# Patient Record
Sex: Female | Born: 1950 | Race: White | Hispanic: No | Marital: Single | Smoking: Former smoker
Health system: Southern US, Community
[De-identification: ages and names within clinical notes are randomized; demographics above are authoritative.]

## PROBLEM LIST (undated history)

## (undated) DIAGNOSIS — M1990 Unspecified osteoarthritis, unspecified site: Secondary | ICD-10-CM

## (undated) DIAGNOSIS — R519 Headache, unspecified: Secondary | ICD-10-CM

## (undated) DIAGNOSIS — T4145XA Adverse effect of unspecified anesthetic, initial encounter: Secondary | ICD-10-CM

## (undated) DIAGNOSIS — G479 Sleep disorder, unspecified: Secondary | ICD-10-CM

## (undated) DIAGNOSIS — T8859XA Other complications of anesthesia, initial encounter: Secondary | ICD-10-CM

## (undated) DIAGNOSIS — Z85828 Personal history of other malignant neoplasm of skin: Secondary | ICD-10-CM

## (undated) DIAGNOSIS — C4490 Unspecified malignant neoplasm of skin, unspecified: Secondary | ICD-10-CM

## (undated) DIAGNOSIS — E785 Hyperlipidemia, unspecified: Secondary | ICD-10-CM

## (undated) DIAGNOSIS — R112 Nausea with vomiting, unspecified: Secondary | ICD-10-CM

## (undated) DIAGNOSIS — I10 Essential (primary) hypertension: Secondary | ICD-10-CM

## (undated) DIAGNOSIS — Z9889 Other specified postprocedural states: Secondary | ICD-10-CM

## (undated) DIAGNOSIS — R51 Headache: Secondary | ICD-10-CM

## (undated) HISTORY — PX: COLONOSCOPY: SHX174

## (undated) HISTORY — DX: Hyperlipidemia, unspecified: E78.5

## (undated) HISTORY — PX: APPENDECTOMY: SHX54

## (undated) HISTORY — DX: Unspecified malignant neoplasm of skin, unspecified: C44.90

## (undated) HISTORY — PX: TONSILLECTOMY AND ADENOIDECTOMY: SUR1326

## (undated) HISTORY — PX: SKIN CANCER EXCISION: SHX779

---

## 1979-12-14 HISTORY — PX: ABDOMINAL HYSTERECTOMY: SHX81

## 1999-08-07 ENCOUNTER — Other Ambulatory Visit: Admission: RE | Admit: 1999-08-07 | Discharge: 1999-08-07 | Payer: Self-pay | Admitting: *Deleted

## 2000-09-06 ENCOUNTER — Other Ambulatory Visit: Admission: RE | Admit: 2000-09-06 | Discharge: 2000-09-06 | Payer: Self-pay | Admitting: *Deleted

## 2001-09-07 ENCOUNTER — Other Ambulatory Visit: Admission: RE | Admit: 2001-09-07 | Discharge: 2001-09-07 | Payer: Self-pay | Admitting: *Deleted

## 2002-09-10 ENCOUNTER — Other Ambulatory Visit: Admission: RE | Admit: 2002-09-10 | Discharge: 2002-09-10 | Payer: Self-pay | Admitting: Obstetrics and Gynecology

## 2003-09-13 ENCOUNTER — Other Ambulatory Visit: Admission: RE | Admit: 2003-09-13 | Discharge: 2003-09-13 | Payer: Self-pay | Admitting: Obstetrics and Gynecology

## 2011-03-03 ENCOUNTER — Ambulatory Visit: Payer: Self-pay | Admitting: Internal Medicine

## 2011-04-12 ENCOUNTER — Other Ambulatory Visit (HOSPITAL_COMMUNITY): Payer: Self-pay

## 2011-06-25 NOTE — H&P (Unsigned)
[name], [name]  MEDICAL RECORD NO.:  [id number]  LOCATION:                                 FACILITY:  PHYSICIAN:  Carol Frankel. Charlann King, M.D.  DATE OF BIRTH:  [birth year]  DATE OF ADMISSION: DATE OF DISCHARGE:                             HISTORY & PHYSICAL   ADMISSION DIAGNOSIS:  Right hip osteoarthritis.  HISTORY OF PRESENT ILLNESS:  This is a 60 year old lady with a history of osteoarthritis of the right hip that has failed conservative treatment.  After discussion of treatment, benefits, risks, and options, the patient is now scheduled for anterior total hip arthroplasty of the right hip.  Note that the patient will be going to rehab postoperatively, so prescriptions were not given today.  She is not a candidate for tranexamic acid and will not receive it in preop holding.  DRUG ALLERGIES:  VICODIN and DARVON.  CURRENT MEDICATIONS: 1. Indomethacin 2 capsules twice daily 25 mg capsules. 2. Sumatriptan 50-100 mg one p.r.n. headaches.  PAST SURGICAL HISTORY: 1. Hysterectomy. 2. Tonsillectomy. 3. Appendectomy.  SERIOUS MEDICAL ILLNESSES:  Migraines.  SOCIAL HISTORY:  The patient is single.  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  She does not smoke and does not drink.  FAMILY HISTORY:  Positive for breast cancer.  REVIEW OF SYSTEMS:  CENTRAL NERVOUS SYSTEM:  Positive for migraines. PULMONARY:  Negative for shortness of breath, PND, orthopnea. CARDIOVASCULAR:  No chest pain or palpitation.  GASTROINTESTINAL: Negative for ulcers, hepatitis.  GENITOURINARY:  Negative for urinary tract difficulty.  SKIN:  Positive for history of basal cell skin cancer.  MUSCULOSKELETAL:  Positive as in HPI.  PHYSICAL EXAMINATION:  VITAL SIGNS:  BP 160/98, respirations 16, pulse 74 and regular. GENERAL APPEARANCE:  A well-developed, well-nourished lady, in no acute distress. HEENT:  Head normocephalic.  Nose patent.  Pupils equal, round, reactive to  light.  Throat without injection. NECK:  Supple without adenopathy.  Carotids 2+ without bruit. CHEST:  Clear to auscultation.  No rales or rhonchi.  Respirations 16. HEART:  Regular rate and rhythm at 74 beats per minute without murmur. ABDOMEN:  Soft with active bowel sounds.  No masses or organomegaly. NEUROLOGIC:  The patient is alert and oriented to time, place, and person.  Cranial nerves II through XII grossly intact. EXTREMITIES:  Right hip with decreased range of motion with pain. Neurovascular status intact.  IMPRESSION:  Right hip osteoarthritis.  PLAN:  Right total hip arthroplasty, anterior approach.  The patient will be going to a nursing home postoperatively.     Carol King, P.A.   ______________________________ Carol King, M.D.    SJC/MEDQ  D:  03/31/2011  T:  04/01/2011  Job:  [id number]

## 2011-07-06 ENCOUNTER — Other Ambulatory Visit: Payer: Self-pay | Admitting: Orthopedic Surgery

## 2011-07-06 ENCOUNTER — Encounter (HOSPITAL_COMMUNITY): Payer: BC Managed Care – PPO

## 2011-07-06 LAB — BASIC METABOLIC PANEL
BUN: 13 mg/dL (ref 6–23)
Creatinine, Ser: 0.68 mg/dL (ref 0.50–1.10)
GFR calc Af Amer: >60 mL/min (ref >60)
GFR calc non Af Amer: >60 mL/min (ref >60)
Glucose, Bld: 98 mg/dL (ref 70–99)
Potassium: 4 mEq/L (ref 3.5–5.1)

## 2011-07-06 LAB — URINALYSIS, ROUTINE W REFLEX MICROSCOPIC
Bilirubin Urine: NEGATIVE
Hgb urine dipstick: NEGATIVE
Ketones, ur: NEGATIVE mg/dL
Nitrite: NEGATIVE
Protein, ur: NEGATIVE mg/dL
Specific Gravity, Urine: 1.014 (ref 1.005–1.030)
Urobilinogen, UA: 0.2 mg/dL (ref 0.0–1.0)

## 2011-07-06 LAB — CBC
HCT: 38.2 % (ref 36.0–46.0)
MCHC: 33.2 g/dL (ref 30.0–36.0)
Platelets: 315 10*3/uL (ref 150–400)
RDW: 13 % (ref 11.5–15.5)
WBC: 7 10*3/uL (ref 4.0–10.5)

## 2011-07-06 LAB — DIFFERENTIAL
Basophils Absolute: 0.1 10*3/uL (ref 0.0–0.1)
Basophils Relative: 1 % (ref 0–1)
Eosinophils Absolute: 0.1 10*3/uL (ref 0.0–0.7)
Eosinophils Relative: 1 % (ref 0–5)
Lymphocytes Relative: 33 % (ref 12–46)
Monocytes Absolute: 0.5 10*3/uL (ref 0.1–1.0)

## 2011-07-06 LAB — PROTIME-INR
INR: 0.98 (ref 0.00–1.49)
Prothrombin Time: 13.2 seconds (ref 11.6–15.2)

## 2011-07-06 LAB — APTT: aPTT: 34 seconds (ref 24–37)

## 2011-07-11 HISTORY — PX: TOTAL HIP ARTHROPLASTY: SHX124

## 2011-07-12 NOTE — H&P (Unsigned)
NAME:  Carol King, Carol King                  ACCOUNT NO.:  MEDICAL RECORD NO.:  [id number]  LOCATION:                                 FACILITY:  PHYSICIAN:  Madlyn Frankel. Charlann Boxer, M.D.  DATE OF BIRTH:  December 19, 1950  DATE OF ADMISSION: DATE OF DISCHARGE:                             HISTORY & PHYSICAL   ADMISSION DIAGNOSIS:  Right hip osteoarthritis.  HISTORY OF PRESENT ILLNESS:  This is a 60 year old lady with a history of osteoarthritis of her right hip with failure of conservative treatment to manage her pain.  Her surgery was previously scheduled, but had to be cancelled secondary to hypertension and thyroid issues.  She has now had those taken care of and has been cleared by Surgery by Dr. Lucianne Muss, her endocrinologist and surgery to go ahead as scheduled.  The surgery risk, benefits, and aftercare were discussed in detail with the patient.  Questions invited and answered.  Note, she is not a candidate for tranexamic acid and will not receive that at surgery.  She also plans to go to [facility] postoperatively, so her postoperative medications were not given to her today [facility]  PAST MEDICAL HISTORY:  Drug allergies to DARVON and VICODIN which both make her very nervous and edgy.  CURRENT MEDICATIONS: 1. Metoprolol 25 mg 1 b.i.d. 2. Methimazole 5 mg 1/2 tablet daily. 3. Indomethacin 50 mg 1 t.i.d. p.r.n. 4. Tramadol 50 mg 1 b.i.d. p.r.n. pain. 5. Sumatriptan 100-mg tablets 1 p.r.n. for migraines.  MEDICAL ILLNESSES:  Include hypertension and hypothyroidism.  PREVIOUS SURGERIES:  Include skin cancer surgery, hysterectomy, endometriosis surgery, tonsillectomy, and appendectomy.  FAMILY HISTORY:  Positive for aneurysm, breast, and lung cancer.  SOCIAL HISTORY:  The patient is single.  She is an Scientist, water quality.  She does not smoke and does not drink and she plans to go to [facility] after her surgery.  PHYSICAL EXAMINATION:  VITAL SIGNS:  BP 170/90, respirations 16,  pulse 66 and regular. GENERAL APPEARANCE:  This is a well-developed, well-nourished lady, in no acute distress. HEENT:  Head normocephalic.  Nose patent.  Ears patent.  Pupils equal, round, and reactive to light.  Throat without injection. NECK:  Supple without adenopathy.  Carotids are 2+ without bruit. CHEST:  Clear to auscultation.  No rales or rhonchi.  Respirations 16. HEART:  Regular rate and rhythm at 66 beats per minute without murmur. ABDOMEN:  Soft.  Active bowel sounds.  No masses or organomegaly. NEUROLOGIC:  The patient is alert and oriented to time, place, and person.  Cranial nerves II-XII grossly intact. EXTREMITIES:  Shows the right hip with decreased range of motion with pain.  Neurovascular status intact.  ADMITTING DIAGNOSIS:  Right hip osteoarthritis.  PLAN:  Right hip total hip arthroplasty by anterior approach.     Jaquelyn Bitter. Sister Carbone, P.A.   ______________________________ Madlyn Frankel Charlann Boxer, M.D.    SJC/MEDQ  D:  06/30/2011  T:  07/01/2011  Job:  [id number]

## 2011-07-13 ENCOUNTER — Inpatient Hospital Stay (HOSPITAL_COMMUNITY)
Admission: RE | Admit: 2011-07-13 | Discharge: 2011-07-16 | DRG: 818 | Disposition: A | Discharge status: Home Health | Payer: BC Managed Care – PPO | Source: Ambulatory Visit | Attending: Orthopedic Surgery | Admitting: Orthopedic Surgery

## 2011-07-13 ENCOUNTER — Inpatient Hospital Stay (HOSPITAL_COMMUNITY): Payer: BC Managed Care – PPO

## 2011-07-13 DIAGNOSIS — I10 Essential (primary) hypertension: Secondary | ICD-10-CM | POA: Diagnosis present

## 2011-07-13 DIAGNOSIS — IMO0001 Reserved for inherently not codable concepts without codable children: Secondary | ICD-10-CM | POA: Diagnosis present

## 2011-07-13 DIAGNOSIS — M1610 Unilateral primary osteoarthritis, unspecified hip: Principal | ICD-10-CM | POA: Diagnosis present

## 2011-07-13 DIAGNOSIS — E039 Hypothyroidism, unspecified: Secondary | ICD-10-CM | POA: Diagnosis present

## 2011-07-13 DIAGNOSIS — M169 Osteoarthritis of hip, unspecified: Principal | ICD-10-CM | POA: Diagnosis present

## 2011-07-13 DIAGNOSIS — Z01812 Encounter for preprocedural laboratory examination: Secondary | ICD-10-CM

## 2011-07-13 LAB — TYPE AND SCREEN: ABO/RH(D): O POS

## 2011-07-13 LAB — ABO/RH: ABO/RH(D): O POS

## 2011-07-14 LAB — BASIC METABOLIC PANEL
CO2: 29 mEq/L (ref 19–32)
Chloride: 106 mEq/L (ref 96–112)
GFR calc non Af Amer: >60 mL/min (ref >60)
Glucose, Bld: 110 mg/dL — ABNORMAL HIGH (ref 70–99)
Potassium: 4.4 mEq/L (ref 3.5–5.1)
Sodium: 140 mEq/L (ref 135–145)

## 2011-07-14 LAB — CBC
Hemoglobin: 9.5 g/dL — ABNORMAL LOW (ref 12.0–15.0)
RBC: 3.01 MIL/uL — ABNORMAL LOW (ref 3.87–5.11)

## 2011-07-15 LAB — BASIC METABOLIC PANEL
CO2: 30 mEq/L (ref 19–32)
Calcium: 9 mg/dL (ref 8.4–10.5)
Glucose, Bld: 97 mg/dL (ref 70–99)
Sodium: 138 mEq/L (ref 135–145)

## 2011-07-15 LAB — CBC
Hemoglobin: 9.1 g/dL — ABNORMAL LOW (ref 12.0–15.0)
MCH: 30.6 pg (ref 26.0–34.0)
MCV: 91.9 fL (ref 78.0–100.0)
RBC: 2.97 MIL/uL — ABNORMAL LOW (ref 3.87–5.11)

## 2011-07-17 NOTE — Op Note (Signed)
NAMEKEERTHI, HAZELL NO.:  [id number]  MEDICAL RECORD NO.:  [id number]  LOCATION:  1621                         FACILITY:  [facility]  PHYSICIAN:  Madlyn Frankel. Charlann Boxer, M.D.  DATE OF BIRTH:  [birth year]  DATE OF PROCEDURE:  07/13/2011 DATE OF DISCHARGE:                              OPERATIVE REPORT   PREOPERATIVE DIAGNOSIS:  Right hip avascular necrosis.  POSTOPERATIVE DIAGNOSIS:  Right hip avascular necrosis.  PROCEDURE:  Right total hip replacement via an anterior approach.  COMPONENTS USED:  DePuy hip system, size 48 Pinnacle cup, a 32 +4 neutral AltrX liner, a size 3 standard Tri-Lock stem with a 32 +1 Delta ceramic ball.  SURGEON:  Madlyn Frankel. Charlann Boxer, M.D.  ASSISTANT:  Lanney Gins, PA  ANESTHESIA:  General.  BLOOD LOSS:  200 cc.  COMPLICATIONS:  None.  INDICATIONS FOR PROCEDURE:  Ms. Dearman is a 60 year old female who had been seen in the office for right hip pain.  Radiographs had revealed fairly significant degenerative changes.  Sometime before we had seen her and when she presented back her symptoms had gotten worse and she at this point is ready to proceed with more definitive measures. Risks and benefits were discussed including infection, DVT, component failure, dislocation, in addition the pros and cons of an anterior versus posterior approach.  She wished at this point to proceed with an anterior approach.  Consent was obtained for the benefit of pain relief.  PROCEDURE IN DETAIL:  The patient was brought to the operative theater. Once adequate anesthesia, preoperative antibiotics, Ancef administered, the patient was positioned supine on the OSI Hana table.  Bony prominences were padded.  The right arm was placed along her chest again with adequate padding.  The right hip was then preliminarily prepped out or draped down. Fluoroscopy was used to confirm position and landmarks.  The right hip was then prepped and draped in sterile fashion  allowing for access to the anterior thigh.  Time-out was performed identifying the patient, planned procedure, and extremity.  An incision was thus made 2 cm distal and lateral to the anterior- superior iliac spine which was readily palpable on her.  Sharp dissection was carried to the fascia of the tensor fascia lata muscle which was then incised.  The muscle was swept laterally and retractor placed along the superior neck.  A second retractor was placed inferiorly and the circumflex vessels and pericapsular fat were debrided.  The anterior capsule was thus exposed and a capsulotomy made from the acetabular rim towards trochanteric fossa then extended proximally and distally.  Tag sutures were placed and retractor was placed. Intracapsular traction was applied to the hip.  Landmarks of a planned osteotomy were measured and confirmed radiographically.  Oscillating saw was used to make a neck osteotomy.  The femoral head was then removed without difficulty or complication.  Traction was then taken off.  The retractor was placed posteriorly outside the labrum.  Second retractor was placed along the anterior rim. After labral debridement, began reaming with a 42 reamer and reamed up to a 47 reamer with good bony bed preparation, two  reamers were used. Under fluoroscopic imaging, confirmed depth of reaming and orientation.  A 48 Pinnacle cup was chosen and it was impacted with a curved impactor under fluoroscopic imaging to confirm abduction and anteversion.  Once the cup was well seated, there was palpable anterior rim.  Single cancellous screw was placed in the ilium to support initial fixation. Given the position and orientation, the final 32 +4 neutral AltrX liner was impacted into position.  At this point, attention was now directed to the femur.  The lateral hook was placed.  The femur was then rotated to 100 degrees and inferior capsule removed and the retractor placed.  I then  created a window along the superior capsule.  The leg was then extended and adducted and posterior trochanteric fossa tissue debrided to allow for placement of femoral broach.  With retractors in place, a box osteotome was used to create an osteotomy into the femur.  I then by hand passed the starting broach. The next broach is a 0 broach which was impacted and I confirmed its position in the AP and lateral planes under fluoroscopy.  Retractors were placed and ended up broaching up to a size 2 broach which did not have a good fit.  At this point, I did a trial reduction with a standard neck based on her pelvis radiographs preoperatively and a 32 one ball. At this point, I felt that perhaps would broach up one more based on the size.  In addition, the leg lengths looked comfortable, and is very well positioned.  At this point, the trial component was removed.  Retractors were placed. I broached to a size 3 inches, 3 standard stem.  The 3 standard stem was then impacted and sat at the level where the broach was with good medial and lateral metaphyseal fit without evidence of any torsional movement. Based on this and my trial reduction, 32 +1 Delta ceramic ball was then chosen, impacted onto the clean and dry trunnion and the hip was reduced.  We had irrigated the hip throughout the case and again at this point final radiographs were taken of the pelvis to confirm leg lengths as well as position of the stem.  The anterior capsule was then reapproximated using #1 Vicryl.  There was very little bleeding, so no Hemovac drain was used.  The fascia of the tensor fascia lata was then reapproximated using #1 Vicryl in running fashion.  The remainder of the wound was closed with 2-0 Vicryl and running 4-0 Monocryl.  The hip was cleaned, dried, and dressed sterilely using Dermabond and Aquacel dressing.  The patient was then brought to recovery room in stable condition, tolerating the procedure  well.     Madlyn Frankel Charlann Boxer, M.D.     MDO/MEDQ  D:  07/13/2011  T:  07/14/2011  Job:  [id number]  Electronically Signed by Durene Romans M.D. on 07/17/2011 09:20:57 AM

## 2011-07-20 NOTE — Discharge Summary (Signed)
NAMEJERRE, DIGUGLIELMO NO.:  [id number]  MEDICAL RECORD NO.:  [id number]  LOCATION:  1621                         FACILITY:  [facility]  PHYSICIAN:  Madlyn Frankel. Charlann Boxer, M.D.  DATE OF BIRTH:  [birth year]  DATE OF ADMISSION:  07/13/2011 DATE OF DISCHARGE:  07/16/2011                              DISCHARGE SUMMARY   PROCEDURE:  Right total hip arthroplasty, anterior approach.  ATTENDING PHYSICIAN:  Madlyn Frankel. Charlann Boxer, MD  ADMITTING DIAGNOSIS:  Right hip osteoarthritis.  DISCHARGE DIAGNOSES: 1. Status post right total hip arthroplasty, anterior approach. 2. Hypertension. 3. Hypothyroidism.  HISTORY OF PRESENT ILLNESS:  The patient is a 60 year old with a history of osteoarthritis of her right hip.  The patient tried several conservative treatments, which all failed to relieve her symptoms.  X- rays in the clinic revealed osteoarthritis of the right hip.  Various options were discussed with the patient and the patient wishes to proceed with surgery.  Risks, benefits, and expectations of the procedure were discussed with the patient.  The patient understands the risks, benefits, and expectations and wishes to proceed with surgery.  HOSPITAL COURSE:  The patient underwent the above-stated procedure on July 13, 2011.  The patient tolerated the procedure well, was brought to the recovery room in good condition, subsequently to the floor.  Postop day #1, July 14, 2011, the patient doing really well, having minimal pain which is well controlled, afebrile, vital signs stable.  Hemoglobin is 9.5 and hematocrit is 27.5.  The patient distally neurovascularly intact.  Dressing is clean, dry, and intact.  The patient will start PT and OT.  On postop day #2, July 15, 2011, the patient continued to be very well, afebrile, vital signs stable.  Hematocrit is 27.3.  The right hip is clean, dry, and intact.  Distally neurovascularly intact.  The patient changed her plan from SNF to  home.  The patient continued physical therapy, pain medicines were changed to tramadol.  On postop day #3, July 16, 2011, the patient doing really well, minimal discomfort, real-real pain in the area, ambulating with the walker by herself very well.  Afebrile, vital signs stable, distally neurovascularly intact.  Dressing clean, dry, intact.  She feels ready to go home.  She will be discharged to home today after PT.  DISCHARGE CONDITION:  Good.  DISCHARGE INSTRUCTIONS:  The patient will be discharged, weight bearing as tolerated.  She will maintain her surgical dressing for about 8 days, at that time she will take if off and replace it with gauze and dressing.  The patient will keep the area dry, clean until followup. The patient will follow up in 2 weeks with Dr. Charlann Boxer at [facility].  The patient is to call with any questions or concerns.  DISCHARGE MEDICATIONS: 1. Aspirin enteric coated 325 mg 1 p.o. b.i.d. for 6 weeks. 2. Tylenol 1000 mg q.8 h. p.r.n. pain. 3. Benadryl 25 mg 1 p.o. q.4 h. p.r.n. 4. Colace 100 mg 1 p.o. b.i.d. p.r.n. constipation. 5. Iron sulfate 325 mg 1 p.o. t.i.d. for 2 or 3 weeks. 6. MiraLax 17 g 1 p.o. daily p.r.n., constipation. 7. Robaxin 500 mg 1 p.o. q.6 h. p.r.n., muscle  spasms. 8. Tramadol 50 mg 1-2 p.o. q.4-6 h. p.r.n., pain. 9. Amlodipine 5 mg 1 p.o. daily. 10.Methimazole 5 mg 1/2 p.o. q.a.m. 11.Metoprolol 1 p.o. b.i.d. 12.Multivitamin 1 p.o. daily. 13.Sumatriptan 100 mg 1/2 to 1 p.o. b.i.d. as needed for migraines. 14.Vitamin B complex 2 p.o. daily.    ______________________________ Lanney Gins, PA   ______________________________ Madlyn Frankel. Charlann Boxer, M.D.    MB/MEDQ  D:  07/16/2011  T:  07/17/2011  Job:  [id number]  Electronically Signed by Lanney Gins PA on 07/17/2011 05:30:55 PM Electronically Signed by Durene Romans M.D. on 07/20/2011 04:54:09 AM

## 2012-04-10 ENCOUNTER — Ambulatory Visit (INDEPENDENT_AMBULATORY_CARE_PROVIDER_SITE_OTHER): Payer: BC Managed Care – PPO | Admitting: Family Medicine

## 2012-04-10 VITALS — BP 130/83 | HR 80 | Temp 98.1°F | Resp 16 | Ht 62.0 in | Wt 117.0 lb

## 2012-04-10 DIAGNOSIS — I10 Essential (primary) hypertension: Secondary | ICD-10-CM

## 2012-04-10 DIAGNOSIS — Z1321 Encounter for screening for nutritional disorder: Secondary | ICD-10-CM

## 2012-04-10 DIAGNOSIS — Z1329 Encounter for screening for other suspected endocrine disorder: Secondary | ICD-10-CM

## 2012-04-10 DIAGNOSIS — Z130 Encounter for screening for diseases of the blood and blood-forming organs and certain disorders involving the immune mechanism: Secondary | ICD-10-CM

## 2012-04-10 DIAGNOSIS — G43909 Migraine, unspecified, not intractable, without status migrainosus: Secondary | ICD-10-CM

## 2012-04-10 MED ORDER — AMLODIPINE BESYLATE 10 MG PO TABS: 10.0000 mg | ORAL_TABLET | Freq: Every day | ORAL | Status: DC

## 2012-04-10 MED ORDER — SUMATRIPTAN SUCCINATE 100 MG PO TABS: 100.0000 mg | ORAL_TABLET | ORAL | Status: DC | PRN

## 2012-04-10 NOTE — Progress Notes (Signed)
  Subjective:    Patient ID: Carol King, female    DOB: October 10, 1951, 61 y.o.   MRN: [id number]  HPI 61 yo female with HTN here for check-up 1) HTN  - Since having hip replacement, pain much better and BP's have been better.  CHecks them at [employer] periodically.  Running 120/60's primarily.  Has self-titrated her metoprolol down to 1/2 tab once daily.  Hoping to come off meds.  2) Vit D - her ob/gyn advised her to have it checked.  Would like it checked today.  3) Migraines - needs her imitrex refilled.    Review of Systems Negative except as per HPI     Objective:   Physical Exam  Constitutional: She appears well-developed and well-nourished.  Cardiovascular: Normal rate, regular rhythm, normal heart sounds and intact distal pulses.   No murmur heard. Pulmonary/Chest: Effort normal and breath sounds normal.  Neurological: She is alert.  Skin: Skin is warm and dry.          Assessment & Plan:  HTN - stop metoprolol.  Continue norvasc.  Okay to continue at 5mg .  Monitor BP.  If start getting over 135 consistently, go up to 10.  Patient understands and is good at monitoring her BP  Vit D - checking.   Migraines - refilled imitrex.

## 2012-08-01 ENCOUNTER — Encounter: Payer: Self-pay | Admitting: Family Medicine

## 2013-01-30 ENCOUNTER — Other Ambulatory Visit: Payer: Self-pay | Admitting: Family Medicine

## 2013-03-29 ENCOUNTER — Encounter: Payer: BC Managed Care – PPO | Admitting: Physician Assistant

## 2013-04-19 ENCOUNTER — Encounter: Payer: Self-pay | Admitting: Physician Assistant

## 2013-04-19 ENCOUNTER — Ambulatory Visit (INDEPENDENT_AMBULATORY_CARE_PROVIDER_SITE_OTHER): Payer: BC Managed Care – PPO | Admitting: Physician Assistant

## 2013-04-19 VITALS — BP 124/76 | HR 80 | Temp 98.0°F | Resp 16 | Ht 63.0 in | Wt 116.0 lb

## 2013-04-19 DIAGNOSIS — G43909 Migraine, unspecified, not intractable, without status migrainosus: Secondary | ICD-10-CM | POA: Insufficient documentation

## 2013-04-19 DIAGNOSIS — Z96649 Presence of unspecified artificial hip joint: Secondary | ICD-10-CM | POA: Insufficient documentation

## 2013-04-19 DIAGNOSIS — Z789 Other specified health status: Secondary | ICD-10-CM

## 2013-04-19 DIAGNOSIS — E0500 Thyrotoxicosis with diffuse goiter without thyrotoxic crisis or storm: Secondary | ICD-10-CM | POA: Insufficient documentation

## 2013-04-19 DIAGNOSIS — Z0000 Encounter for general adult medical examination without abnormal findings: Secondary | ICD-10-CM

## 2013-04-19 DIAGNOSIS — E0590 Thyrotoxicosis, unspecified without thyrotoxic crisis or storm: Secondary | ICD-10-CM

## 2013-04-19 DIAGNOSIS — I10 Essential (primary) hypertension: Secondary | ICD-10-CM

## 2013-04-19 DIAGNOSIS — Z1159 Encounter for screening for other viral diseases: Secondary | ICD-10-CM

## 2013-04-19 LAB — CBC WITH DIFFERENTIAL/PLATELET
Basophils Absolute: 0 10*3/uL (ref 0.0–0.1)
Eosinophils Relative: 4 % (ref 0–5)
HCT: 32.4 % — ABNORMAL LOW (ref 36.0–46.0)
Lymphocytes Relative: 43 % (ref 12–46)
Lymphs Abs: 1.8 10*3/uL (ref 0.7–4.0)
MCV: 84.8 fL (ref 78.0–100.0)
Monocytes Absolute: 0.4 10*3/uL (ref 0.1–1.0)
Monocytes Relative: 9 % (ref 3–12)
RDW: 13.2 % (ref 11.5–15.5)
WBC: 4.1 10*3/uL (ref 4.0–10.5)

## 2013-04-19 LAB — POCT URINALYSIS DIPSTICK
Blood, UA: NEGATIVE
Nitrite, UA: NEGATIVE
Spec Grav, UA: >=1.03
Urobilinogen, UA: 0.2
pH, UA: 6

## 2013-04-19 LAB — COMPREHENSIVE METABOLIC PANEL
BUN: 18 mg/dL (ref 6–23)
CO2: 26 mEq/L (ref 19–32)
Calcium: 9.3 mg/dL (ref 8.4–10.5)
Chloride: 105 mEq/L (ref 96–112)
Creat: 0.79 mg/dL (ref 0.50–1.10)
Glucose, Bld: 94 mg/dL (ref 70–99)

## 2013-04-19 LAB — LIPID PANEL
Cholesterol: 194 mg/dL (ref 0–200)
HDL: 65 mg/dL (ref >39)
Total CHOL/HDL Ratio: 3 Ratio
Triglycerides: 102 mg/dL (ref <150)

## 2013-04-19 LAB — POCT UA - MICROSCOPIC ONLY: Yeast, UA: NEGATIVE

## 2013-04-19 MED ORDER — AMLODIPINE BESYLATE 10 MG PO TABS: 10.0000 mg | ORAL_TABLET | Freq: Every day | ORAL | Status: DC

## 2013-04-19 MED ORDER — SUMATRIPTAN SUCCINATE 100 MG PO TABS: 100.0000 mg | ORAL_TABLET | ORAL | Status: DC | PRN

## 2013-04-19 NOTE — Patient Instructions (Addendum)

## 2013-04-20 ENCOUNTER — Encounter: Payer: Self-pay | Admitting: Physician Assistant

## 2013-04-20 LAB — HEPATITIS B SURFACE ANTIGEN: Hepatitis B Surface Ag: NEGATIVE

## 2013-04-20 NOTE — Progress Notes (Signed)
Subjective:    Patient ID: Carol King, female    DOB: October 24, 1951, 62 y.o.   MRN: [id number]  HPI This 62 y.o. female presents for Annual Wellness Examination. Her breast/pelvic exam is scheduled with Debbora Dus later this month.  She sees dermatology Q6 months (previously follwoed by Dr. Londell Moh, and will continue with his partner once he retires this year).  Patient Active Problem List   Diagnosis Date Noted  . HTN (hypertension) 04/19/2013  . Migraine 04/19/2013  . 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 Hyperthyroidism 04/19/2013     Past Medical History  Diagnosis Date  . Thyroid disease   . Skin cancer     BCC, SCC    Past Surgical History  Procedure Laterality Date  . Appendectomy    . Abdominal hysterectomy    . Tonsillectomy and adenoidectomy    . Joint replacement Right 07/11/2011    HIP; Charlann Boxer    Prior to Admission medications   Medication Sig Start Date End Date Taking? Authorizing Provider  amLODipine (NORVASC) 10 MG tablet Take 1 tablet (10 mg total) by mouth daily. 04/19/13  Yes Jeralyn Nolden S Zissel Biederman, PA-C  SUMAtriptan (IMITREX) 100 MG tablet Take 1 tablet (100 mg total) by mouth as needed for migraine. 04/19/13  Yes Marquette Blodgett S Jasmeet Gehl, PA-C  amoxicillin (AMOXIL) 500 MG capsule Take 2,000 mg by mouth as needed. Due to hip replacement 04/02/13   Historical Provider, MD    Allergies  Allergen Reactions  . Darvon (Propoxyphene Hcl)     "climb the walls"  . Vicodin (Hydrocodone-Acetaminophen)     "climb the walls"    History   Social History  . Marital Status: Single    Spouse Name: n/a    Number of Children: 0  . Years of Education: college   Occupational History  . Engineer, structural    Social History Main Topics  . Smoking status: Former Games developer  . Smokeless tobacco: Never Used  . Alcohol Use: No  . Drug Use: No  . Sexually Active: No   Other Topics Concern  . Not on file   Social History Narrative   Lives alone.    Family History  Problem  Relation Age of Onset  . Cancer Mother   . Cancer Maternal Grandmother   . Kidney disease Maternal Grandfather   . Arthritis Paternal Grandmother      Review of Systems  Constitutional: Negative.   HENT: Negative.   Eyes: Negative.   Respiratory: Negative.   Cardiovascular: Negative.   Gastrointestinal: Negative.   Endocrine: Negative.   Genitourinary: Negative.   Musculoskeletal: Negative.   Skin: Negative.   Allergic/Immunologic: Negative.   Neurological: Negative.   Hematological: Negative.   Psychiatric/Behavioral: Negative.        Objective:   Physical Exam  Vitals reviewed. Constitutional: She is oriented to person, place, and time. Vital signs are normal. She appears well-developed and well-nourished. She is active and cooperative. No distress.  HENT:  Head: Normocephalic and atraumatic.  Right Ear: Hearing, tympanic membrane, external ear and ear canal normal. No foreign bodies.  Left Ear: Hearing, tympanic membrane, external ear and ear canal normal. No foreign bodies.  Nose: Nose normal.  Mouth/Throat: Uvula is midline, oropharynx is clear and moist and mucous membranes are normal. No oral lesions. Normal dentition. No dental abscesses or edematous. No oropharyngeal exudate.  Eyes: Conjunctivae, EOM and lids are normal. Pupils are equal, round, and reactive to light. Right eye exhibits no discharge. Left eye exhibits no  discharge. No scleral icterus.  Fundoscopic exam:      The right eye shows no arteriolar narrowing, no AV nicking, no exudate, no hemorrhage and no papilledema. The right eye shows red reflex.       The left eye shows no arteriolar narrowing, no AV nicking, no exudate, no hemorrhage and no papilledema. The left eye shows red reflex.  Neck: Trachea normal, normal range of motion and full passive range of motion without pain. Neck supple. No spinous process tenderness and no muscular tenderness present. No mass and no thyromegaly present.   Cardiovascular: Normal rate, regular rhythm, normal heart sounds, intact distal pulses and normal pulses.   Pulmonary/Chest: Effort normal and breath sounds normal.  Abdominal: Soft. Normal appearance and bowel sounds are normal. She exhibits no distension and no mass. There is no hepatosplenomegaly. There is no tenderness. There is no rigidity, no rebound, no guarding, no CVA tenderness, no tenderness at McBurney's point and negative Murphy's sign. No hernia.  Musculoskeletal: She exhibits no edema and no tenderness.       Cervical back: Normal.       Thoracic back: Normal.       Lumbar back: Normal.  Lymphadenopathy:       Head (right side): No tonsillar, no preauricular, no posterior auricular and no occipital adenopathy present.       Head (left side): No tonsillar, no preauricular, no posterior auricular and no occipital adenopathy present.    She has no cervical adenopathy.       Right: No supraclavicular adenopathy present.       Left: No supraclavicular adenopathy present.  Neurological: She is alert and oriented to person, place, and time. She has normal strength and normal reflexes. No cranial nerve deficit. She exhibits normal muscle tone. Coordination and gait normal.  Skin: Skin is warm, dry and intact. No rash noted. She is not diaphoretic. No cyanosis or erythema. Nails show no clubbing.  Psychiatric: She has a normal mood and affect. Her speech is normal and behavior is normal. Judgment and thought content normal.          Assessment & Plan:  Routine general medical examination at a health care facility - Plan: Lipid panel, POCT UA - Microscopic Only, POCT urinalysis dipstick; age appropriate health guidance.  HTN (hypertension) - Plan: CBC with Differential, Comprehensive metabolic panel, amLODipine (NORVASC) 10 MG tablet  Migraine - Plan: SUMAtriptan (IMITREX) 100 MG tablet  S/P hip replacement - amoxicillin pre-treatment prior to dental work/invasive  procedures.  Hyperthyroidism - Plan: TSH  Need for hepatitis C screening test - Plan: Hepatitis C antibody  Hepatitis B vaccination status unknown - Plan: Hepatitis B surface antibody, Hepatitis B surface antigen  Fernande Bras, PA-C Physician Assistant-Certified [facility]

## 2013-04-24 ENCOUNTER — Encounter: Payer: Self-pay | Admitting: Physician Assistant

## 2013-10-25 ENCOUNTER — Ambulatory Visit: Payer: BC Managed Care – PPO | Admitting: Physician Assistant

## 2013-11-01 ENCOUNTER — Ambulatory Visit: Payer: BC Managed Care – PPO | Admitting: Physician Assistant

## 2013-12-20 ENCOUNTER — Ambulatory Visit: Payer: BC Managed Care – PPO | Admitting: Physician Assistant

## 2014-01-03 ENCOUNTER — Ambulatory Visit (INDEPENDENT_AMBULATORY_CARE_PROVIDER_SITE_OTHER): Payer: BC Managed Care – PPO | Admitting: Physician Assistant

## 2014-01-03 ENCOUNTER — Encounter: Payer: Self-pay | Admitting: Physician Assistant

## 2014-01-03 VITALS — BP 162/92 | HR 82 | Temp 98.6°F | Resp 16 | Ht 62.0 in | Wt 111.2 lb

## 2014-01-03 DIAGNOSIS — I10 Essential (primary) hypertension: Secondary | ICD-10-CM

## 2014-01-03 DIAGNOSIS — G43909 Migraine, unspecified, not intractable, without status migrainosus: Secondary | ICD-10-CM

## 2014-01-03 DIAGNOSIS — Z23 Encounter for immunization: Secondary | ICD-10-CM

## 2014-01-03 MED ORDER — AMLODIPINE BESYLATE 10 MG PO TABS: 10.0000 mg | ORAL_TABLET | Freq: Every day | ORAL | Status: DC

## 2014-01-03 MED ORDER — SUMATRIPTAN SUCCINATE 100 MG PO TABS: 100.0000 mg | ORAL_TABLET | ORAL | Status: DC | PRN

## 2014-01-03 NOTE — Progress Notes (Signed)
Subjective:    Patient ID: Georgian Co, female    DOB: 1951-04-26, 63 y.o.   MRN: [id number]  HPI  Mrs. Zoua Caporaso presents to the clinic today for rx refills, HTN check and sore throat.  Blood pressure has been running 120-130/70. Has it checked every week at work. Has only been taking 5 mg of amilodipine and is concerned she may need to increased her dose to 10 mg. Before Christmas bp got up 140/90 and she took 10 mg daily of amilodipine - she was experiencing episodes of low bp at 10 mg dose so she has been only taking 5 mg since then. Work had been extra stressful during that time with lots of funeral - she says work has calmed down recently and feels that this will help with bp. She is very active at work, walking 8,000-10,000 steps per day.   Migraines have been tolerable. Imitrex seems to be helping. If she can anticipate migraine, she is better able to control it. Much worse when she wakes up in the morning with migraine.   Sore throat since 12/31/13. Sinus pressure on right side with headache that feels different than her typical migraine. Denies nasal congestion, ear pain and cough. Taking AlkaSeltzer for cold which relief.  Review of Systems As above.     Objective:   Physical Exam  Vitals reviewed. Constitutional: She is oriented to person, place, and time. She appears well-developed and well-nourished.  HENT:  Head: Normocephalic and atraumatic.  Right Ear: Tympanic membrane, external ear and ear canal normal.  Left Ear: Tympanic membrane, external ear and ear canal normal.  Nose: Rhinorrhea (mild) present. No mucosal edema.  Mouth/Throat: Uvula is midline, oropharynx is clear and moist and mucous membranes are normal.  Eyes: Conjunctivae are normal.  Neck: Neck supple.  Cardiovascular: Normal rate, regular rhythm and normal heart sounds.   Pulmonary/Chest: Effort normal and breath sounds normal.  Lymphadenopathy:       Head (right side): No submental, no  submandibular, no tonsillar, no preauricular, no posterior auricular and no occipital adenopathy present.       Head (left side): No submental, no submandibular, no tonsillar, no preauricular, no posterior auricular and no occipital adenopathy present.    She has no cervical adenopathy.       Right: No supraclavicular adenopathy present.       Left: No supraclavicular adenopathy present.  Neurological: She is alert and oriented to person, place, and time.  Skin: Skin is warm and dry.  Psychiatric: She has a normal mood and affect. Her behavior is normal. Judgment and thought content normal.        Assessment & Plan:   1. HTN (hypertension) BP is slightly elevated today, but has otherwise been well controlled since the beginning of 1/15. Will continue Amlodipine 5 mg daily - she requests 10 mg tablets to get more at one time. Continue to monitor BP weekly.  - amLODipine (NORVASC) 10 MG tablet; Take 1 tablet (10 mg total) by mouth daily.  Dispense: 90 tablet; Refill: 3  2. Migraine Continue Imitrex as prescribed.  - SUMAtriptan (IMITREX) 100 MG tablet; Take 1 tablet (100 mg total) by mouth every 2 (two) hours as needed for migraine or headache. May repeat in 2 hours if headache persists or recurs.  Dispense: 9 tablet; Refill: 12  3. Need for hepatitis B vaccination First dose administered 01/03/14. Will return in 1 month for second dose. Will have third dose at CPE in July.  -  Hepatitis B vaccine adult IM; Standing - Hepatitis B vaccine adult IM  Continue alka seltzer for sore throat.

## 2014-01-03 NOTE — Patient Instructions (Signed)
Keep up the great work!

## 2014-01-04 NOTE — Progress Notes (Signed)
I have examined this patient along with the student and agree.  

## 2014-01-10 ENCOUNTER — Encounter: Payer: Self-pay | Admitting: Physician Assistant

## 2014-01-10 DIAGNOSIS — Z780 Asymptomatic menopausal state: Secondary | ICD-10-CM | POA: Insufficient documentation

## 2014-01-10 DIAGNOSIS — M797 Fibromyalgia: Secondary | ICD-10-CM

## 2014-03-26 ENCOUNTER — Ambulatory Visit (INDEPENDENT_AMBULATORY_CARE_PROVIDER_SITE_OTHER): Payer: BC Managed Care – PPO | Admitting: Family Medicine

## 2014-03-26 VITALS — BP 140/90 | HR 74 | Temp 98.2°F | Resp 16 | Ht 63.0 in | Wt 118.0 lb

## 2014-03-26 DIAGNOSIS — H9202 Otalgia, left ear: Secondary | ICD-10-CM

## 2014-03-26 DIAGNOSIS — R0981 Nasal congestion: Secondary | ICD-10-CM

## 2014-03-26 DIAGNOSIS — J3489 Other specified disorders of nose and nasal sinuses: Secondary | ICD-10-CM

## 2014-03-26 DIAGNOSIS — Z23 Encounter for immunization: Secondary | ICD-10-CM

## 2014-03-26 DIAGNOSIS — H9209 Otalgia, unspecified ear: Secondary | ICD-10-CM

## 2014-03-26 MED ORDER — FLUTICASONE PROPIONATE 50 MCG/ACT NA SUSP: 2.0000 | Freq: Every day | NASAL | Status: DC

## 2014-03-26 NOTE — Patient Instructions (Signed)
Plain sudafed, 1 tablet every 6-8 hours for ear/nasal congestino- continue to monitor blood pressure.  Plain mucinex per package directions to thin secretions Use flonase daily for 3-4 weeks. Return if no improvement in 3-5 days or fever, increased pain. Can take ibuprofen 400 mg every 8 hours for pain

## 2014-03-26 NOTE — Progress Notes (Signed)
   Subjective:    Patient ID: Carol King, female    DOB: [birth year], 63 y.o.   MRN: [id number]  HPI Sore throat 3/24, for several days. Eased off, remains kind of sore. Left ear with congestion and decreased hearing for 2 days. Had nasal congestion, but that is better. Feels puffy below eyes.  Took cold tablet with some relief. Took Allegra D which made her feel horribly dry. Had BP taken today at work and it was 122/68 prior to cold medicine.  Has not had problems with seasonal allergies.  Had Hep B #1 01/03/14 and would like second dose today.  Review of Systems No cough, thinks her temperature is a little elevated. No SOB, no chest pain.    Objective:   Physical Exam  Vitals reviewed. Constitutional: She is oriented to person, place, and time. She appears well-developed and well-nourished.  HENT:  Head: Normocephalic and atraumatic.  Right Ear: Tympanic membrane, external ear and ear canal normal.  Left Ear: Tympanic membrane, external ear and ear canal normal.  Nose: Mucosal edema present. Right sinus exhibits no maxillary sinus tenderness and no frontal sinus tenderness. Left sinus exhibits no maxillary sinus tenderness and no frontal sinus tenderness.  Left canal with small amount of cerumen. Non tender to exam.  Eyes: Conjunctivae are normal. Right eye exhibits no discharge. Left eye exhibits no discharge.  Neck: Normal range of motion. Neck supple.  Cardiovascular: Normal rate, regular rhythm and normal heart sounds.   Pulmonary/Chest: Effort normal and breath sounds normal.  Musculoskeletal: Normal range of motion.  Lymphadenopathy:    She has no cervical adenopathy.  Neurological: She is alert and oriented to person, place, and time.  Skin: Skin is warm and dry.  Psychiatric: She has a normal mood and affect. Her behavior is normal. Judgment and thought content normal.        Assessment & Plan:  1. Need for hepatitis B vaccination Vaccine #2 given  2. Otalgia of  left ear Suspect this is allergic rhinitis  3. Nasal congestion  Patient Instructions  Plain sudafed, 1 tablet every 6-8 hours for ear/nasal congestino- continue to monitor blood pressure.  Plain mucinex per package directions to thin secretions Use flonase daily for 3-4 weeks. Return if no improvement in 3-5 days or fever, increased pain. Can take ibuprofen 400 mg every 8 hours for pain   Elby Beck, FNP-BC  [facility]  03/26/2014 5:03 PM

## 2014-03-30 ENCOUNTER — Ambulatory Visit (INDEPENDENT_AMBULATORY_CARE_PROVIDER_SITE_OTHER): Payer: BC Managed Care – PPO | Admitting: Family Medicine

## 2014-03-30 VITALS — BP 124/70 | HR 86 | Temp 97.9°F | Resp 18 | Ht 63.0 in | Wt 111.0 lb

## 2014-03-30 DIAGNOSIS — H6980 Other specified disorders of Eustachian tube, unspecified ear: Secondary | ICD-10-CM

## 2014-03-30 MED ORDER — PREDNISONE 20 MG PO TABS: ORAL_TABLET | ORAL | Status: DC

## 2014-03-30 MED ORDER — ANTIPYRINE-BENZOCAINE 5.4-1.4 % OT SOLN: 3.0000 [drp] | OTIC | Status: DC | PRN

## 2014-03-30 NOTE — Progress Notes (Signed)
[facility] [address] [place] [address] [phone]  Date:  03/30/2014   Name:  Carol King   DOB:  Apr 29, 1951   MRN:  [id number]  PCP:  Lamar Blinks, MD    Chief Complaint: Otalgia   History of Present Illness:  Carol King is a 63 y.o. very pleasant female patient who presents with the following:  Here today with a left ear problem.   Today is Saturday- since Tuesday she has discomfort in her left ear.  It is clicking and feels congested, her hearing is less.   She was seen here, treated with flonase, sudaged, mucinex.   Yesterday she had a temp of 99 and vomited.   The left ear is "beginning to hurt."   She works at [employer] now.    Patient Active Problem List   Diagnosis Date Noted  . Fibromyalgia 01/10/2014  . Post-menopausal 01/10/2014  . HTN (hypertension) 04/19/2013  . Migraine 04/19/2013  . 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 Hyperthyroidism 04/19/2013    Past Medical History  Diagnosis Date  . Thyroid disease   . Skin cancer     BCC, SCC    Past Surgical History  Procedure Laterality Date  . Appendectomy    . Tonsillectomy and adenoidectomy    . Joint replacement Right 07/11/2011    HIP; [facility]  . Abdominal hysterectomy  1981    History  Substance Use Topics  . Smoking status: Former Research scientist (life sciences)  . Smokeless tobacco: Never Used  . Alcohol Use: No    Family History  Problem Relation Age of Onset  . Cancer Mother   . Cancer Maternal Grandmother   . Kidney disease Maternal Grandfather   . Arthritis Paternal Grandmother     Allergies  Allergen Reactions  . Darvon [Propoxyphene Hcl]     "climb the walls"  . Vicodin [Hydrocodone-Acetaminophen]     "climb the walls"    Medication list has been reviewed and updated.  Current Outpatient Prescriptions on File Prior to Visit  Medication Sig Dispense Refill  . amLODipine (NORVASC) 10 MG tablet Take 1 tablet (10 mg total) by mouth daily.  90 tablet  3   . fluticasone (FLONASE) 50 MCG/ACT nasal spray Place 2 sprays into both nostrils daily.  16 g  6  . SUMAtriptan (IMITREX) 100 MG tablet Take 1 tablet (100 mg total) by mouth every 2 (two) hours as needed for migraine or headache. May repeat in 2 hours if headache persists or recurs.  9 tablet  12   No current facility-administered medications on file prior to visit.    Review of Systems:  As per HPI- otherwise negative.   Physical Examination: Filed Vitals:   03/30/14 0922  BP: 124/70  Pulse: 100  Temp: 97.9 F (36.6 C)  Resp: 18   Filed Vitals:   03/30/14 0922  Height: 5\' 3"  (1.6 m)  Weight: 111 lb (50.349 kg)   Body mass index is 19.67 kg/(m^2). Ideal Body Weight: Weight in (lb) to have BMI = 25: 140.8  GEN: WDWN, NAD, Non-toxic, A & O x 3 HEENT: Atraumatic, Normocephalic. Neck supple. No masses, No LAD. Ears and Nose: No external deformity. CV: RRR, No M/G/R. No JVD. No thrill. No extra heart sounds. PULM: CTA B, no wheezes, crackles, rhonchi. No retractions. No resp. distress. No accessory muscle use. ABD: S, NT, ND, +BS. No rebound. No HSM.  Benign exam EXTR: No c/c/e NEURO Normal gait.  PSYCH: Normally  interactive. Conversant. Not depressed or anxious appearing.  Calm demeanor.   Both ears with cerumen. Removed a large arount from right ear and a smaller amount from left ear with curette Tender pre- auricular node left only.  No other tender or enlarged nodes  Tried application of AB otic left ear- this did help her to feel better.    Assessment and Plan: ETD (eustachian tube dysfunction) - Plan: predniSONE (DELTASONE) 20 MG tablet, antipyrine-benzocaine (AURALGAN) otic solution  Suspect her left ear sx are due to ETD.  Will try a short burst of prednisone to relieve these sx, continue aurulgan as needed She will let me know if not better in the next few days- Sooner if worse.     Signed Lamar Blinks, MD

## 2014-03-30 NOTE — Patient Instructions (Signed)
Use the prednisone as directed.  If two pills seems to strong you can take just one.  Try the ear drops as needed as well.  Let me know if you are not feeling better in the next few days- Sooner if worse.

## 2014-04-01 NOTE — Progress Notes (Signed)
I have discussed this case with Ms. Gessner, NP and agree.  

## 2014-07-04 ENCOUNTER — Encounter: Payer: Self-pay | Admitting: Physician Assistant

## 2014-07-15 ENCOUNTER — Ambulatory Visit: Payer: BC Managed Care – PPO | Admitting: Family Medicine

## 2014-07-15 ENCOUNTER — Encounter: Payer: Self-pay | Admitting: Family Medicine

## 2014-07-15 ENCOUNTER — Ambulatory Visit (INDEPENDENT_AMBULATORY_CARE_PROVIDER_SITE_OTHER): Payer: BC Managed Care – PPO | Admitting: Family Medicine

## 2014-07-15 VITALS — BP 105/67 | HR 77 | Temp 98.2°F | Resp 16 | Ht 62.5 in | Wt 109.8 lb

## 2014-07-15 DIAGNOSIS — Z13220 Encounter for screening for lipoid disorders: Secondary | ICD-10-CM

## 2014-07-15 DIAGNOSIS — I10 Essential (primary) hypertension: Secondary | ICD-10-CM

## 2014-07-15 DIAGNOSIS — Z0000 Encounter for general adult medical examination without abnormal findings: Secondary | ICD-10-CM

## 2014-07-15 DIAGNOSIS — Z1329 Encounter for screening for other suspected endocrine disorder: Secondary | ICD-10-CM

## 2014-07-15 DIAGNOSIS — Z23 Encounter for immunization: Secondary | ICD-10-CM

## 2014-07-15 LAB — COMPREHENSIVE METABOLIC PANEL
ALBUMIN: 4.2 g/dL (ref 3.5–5.2)
ALT: 21 U/L (ref 0–35)
AST: 23 U/L (ref 0–37)
Alkaline Phosphatase: 60 U/L (ref 39–117)
BUN: 18 mg/dL (ref 6–23)
CHLORIDE: 102 meq/L (ref 96–112)
CO2: 25 mEq/L (ref 19–32)
Calcium: 9.8 mg/dL (ref 8.4–10.5)
Creat: 0.99 mg/dL (ref 0.50–1.10)
Glucose, Bld: 91 mg/dL (ref 70–99)
POTASSIUM: 4.3 meq/L (ref 3.5–5.3)
Sodium: 138 mEq/L (ref 135–145)
TOTAL PROTEIN: 6.8 g/dL (ref 6.0–8.3)
Total Bilirubin: 0.3 mg/dL (ref 0.2–1.2)

## 2014-07-15 LAB — CBC WITH DIFFERENTIAL/PLATELET
BASOS ABS: 0.1 10*3/uL (ref 0.0–0.1)
Basophils Relative: 2 % — ABNORMAL HIGH (ref 0–1)
EOS ABS: 0.1 10*3/uL (ref 0.0–0.7)
EOS PCT: 2 % (ref 0–5)
HCT: 39.7 % (ref 36.0–46.0)
Hemoglobin: 13.6 g/dL (ref 12.0–15.0)
LYMPHS PCT: 42 % (ref 12–46)
Lymphs Abs: 2.7 10*3/uL (ref 0.7–4.0)
MCH: 29.8 pg (ref 26.0–34.0)
MCHC: 34.3 g/dL (ref 30.0–36.0)
MCV: 87.1 fL (ref 78.0–100.0)
Monocytes Absolute: 0.5 10*3/uL (ref 0.1–1.0)
Monocytes Relative: 8 % (ref 3–12)
Neutro Abs: 2.9 10*3/uL (ref 1.7–7.7)
Neutrophils Relative %: 46 % (ref 43–77)
PLATELETS: 319 10*3/uL (ref 150–400)
RBC: 4.56 MIL/uL (ref 3.87–5.11)
RDW: 13.8 % (ref 11.5–15.5)
WBC: 6.4 10*3/uL (ref 4.0–10.5)

## 2014-07-15 LAB — LIPID PANEL
CHOLESTEROL: 212 mg/dL — AB (ref 0–200)
HDL: 86 mg/dL (ref >39)
LDL Cholesterol: 101 mg/dL — ABNORMAL HIGH (ref 0–99)
Total CHOL/HDL Ratio: 2.5 Ratio
Triglycerides: 123 mg/dL (ref <150)
VLDL: 25 mg/dL (ref 0–40)

## 2014-07-15 LAB — TSH: TSH: 0.011 u[IU]/mL — ABNORMAL LOW (ref 0.350–4.500)

## 2014-07-15 NOTE — Patient Instructions (Signed)
I will be in touch with your labs asap. When you turn 63 it will be time for your pneumonia vaccines Be sure you are eating enough!   It is time to schedule a colonoscopy.  You might call [facility] or [facility] division.

## 2014-07-15 NOTE — Progress Notes (Addendum)
[facility] [address], [place] [address] [phone]  Date:  07/15/2014   Name:  Carol King   DOB:  10-11-1951   MRN:  [id number]  PCP:  Lamar Blinks, MD    Chief Complaint: Annual Exam and Medication Refill   History of Present Illness:  Carol King is a 63 y.o. very pleasant female patient who presents with the following:  Here today for a CPE.  She is fasting today for labs.  Last labs about one year ago Colonoscopy: she had a flex sig in 2001.  She thinks she had had a Dexa scan a few years ago, but she will check on this with her OBG NP Laurin Coder at [facility]. Her appt is coming up soon She has had a right total hip, she is now having some trouble with her left hip and is being seen by Dr. Aurea Graff PA tomorrow.  She plans to have her left hip replaced sometime next year.   She would like to have her Hep B #3 today  She is taking about 5mg  of norvasc a day; she has her BP checked once a week at work and it is looking good.   She feels very well.  Her weight "does fluctuate."    Wt Readings from Last 3 Encounters:  07/15/14 109 lb 12.8 oz (49.805 kg)  03/30/14 111 lb (50.349 kg)  03/26/14 118 lb (53.524 kg)    Patient Active Problem List   Diagnosis Date Noted  . Fibromyalgia 01/10/2014  . Post-menopausal 01/10/2014  . HTN (hypertension) 04/19/2013  . Migraine 04/19/2013  . 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 Hyperthyroidism 04/19/2013    Past Medical History  Diagnosis Date  . Thyroid disease   . Skin cancer     BCC, SCC    Past Surgical History  Procedure Laterality Date  . Appendectomy    . Tonsillectomy and adenoidectomy    . Joint replacement Right 07/11/2011    HIP; [facility]  . Abdominal hysterectomy  1981    History  Substance Use Topics  . Smoking status: Former Research scientist (life sciences)  . Smokeless tobacco: Never Used  . Alcohol Use: No    Family History  Problem Relation Age of Onset  . Cancer Mother   . Cancer Maternal  Grandmother   . Kidney disease Maternal Grandfather   . Arthritis Paternal Grandmother     Allergies  Allergen Reactions  . Darvon [Propoxyphene Hcl]     "climb the walls"  . Vicodin [Hydrocodone-Acetaminophen]     "climb the walls"    Medication list has been reviewed and updated.  Current Outpatient Prescriptions on File Prior to Visit  Medication Sig Dispense Refill  . amLODipine (NORVASC) 10 MG tablet Take 1 tablet (10 mg total) by mouth daily.  90 tablet  3  . SUMAtriptan (IMITREX) 100 MG tablet Take 1 tablet (100 mg total) by mouth every 2 (two) hours as needed for migraine or headache. May repeat in 2 hours if headache persists or recurs.  9 tablet  12   No current facility-administered medications on file prior to visit.    Review of Systems:  As per HPI- otherwise negative.   Physical Examination: Filed Vitals:   07/15/14 0814  BP: 105/67  Pulse: 77  Temp: 98.2 F (36.8 C)  Resp: 16   Filed Vitals:   07/15/14 0814  Height: 5' 2.5" (1.588 m)  Weight: 109 lb 12.8 oz (49.805 kg)   Body mass  index is 19.75 kg/(m^2). Ideal Body Weight: Weight in (lb) to have BMI = 25: 138.6  GEN: WDWN, NAD, Non-toxic, A & O x , slim build, looks well HEENT: Atraumatic, Normocephalic. Neck supple. No masses, No LAD.  Bilateral TM wnl, oropharynx normal.  PEERL,EOMI.   Ears and Nose: No external deformity. CV: RRR, No M/G/R. No JVD. No thrill. No extra heart sounds. PULM: CTA B, no wheezes, crackles, rhonchi. No retractions. No resp. distress. No accessory muscle use. ABD: S, NT, ND. No rebound. No HSM. EXTR: No c/c/e NEURO Normal gait.  PSYCH: Normally interactive. Conversant. Not depressed or anxious appearing.  Calm demeanor.  Her left hip is quite painful with movement   Assessment and Plan: Physical exam - Plan: CBC with Differential, Comprehensive metabolic panel  Screening for hyperlipidemia - Plan: Lipid panel  Screening for hypothyroidism - Plan: TSH  Benign  essential HTN - Plan: CBC with Differential, Comprehensive metabolic panel  Immunization due - Plan: Hepatitis B vaccine adult IM  She was actually too early for Hep B #3, so will do this at our next visit. Will plan further follow- up pending labs. Encouraged her to schedule a colonoscopy.  She plans to do so  Signed Lamar Blinks, MD  8/4 Called her regarding her labs. She plans to have her hip done next year.  I will send her a copy of her labs.  She is established with Dr. Dwyane Dee and will call him for a follow-up soon.

## 2014-07-15 NOTE — Progress Notes (Signed)
   Subjective:    Patient ID: Carol King, female    DOB: [birth year], 63 y.o.   MRN: [id number]  HPI    Review of Systems     Objective:   Physical Exam        Assessment & Plan:

## 2014-07-16 ENCOUNTER — Encounter: Payer: Self-pay | Admitting: Family Medicine

## 2014-07-18 ENCOUNTER — Encounter: Payer: BC Managed Care – PPO | Admitting: Physician Assistant

## 2014-08-19 ENCOUNTER — Ambulatory Visit (INDEPENDENT_AMBULATORY_CARE_PROVIDER_SITE_OTHER): Payer: BC Managed Care – PPO | Admitting: Family Medicine

## 2014-08-19 VITALS — BP 102/64 | HR 82 | Temp 98.1°F | Resp 16

## 2014-08-19 DIAGNOSIS — Z23 Encounter for immunization: Secondary | ICD-10-CM

## 2014-08-19 NOTE — Patient Instructions (Signed)
Hepatitis B Vaccine, Recombinant injection  What is this medicine?  HEPATITIS B VACCINE (hep uh TAHY tis B VAK seen) is a vaccine. It is used to prevent an infection with the hepatitis B virus.  This medicine may be used for other purposes; ask your health care provider or pharmacist if you have questions.  COMMON BRAND NAME(S): Engerix-B, Recombivax HB  What should I tell my health care provider before I take this medicine?  They need to know if you have any of these conditions:  -fever, infection  -heart disease  -hepatitis B infection  -immune system problems  -kidney disease  -an unusual or allergic reaction to vaccines, yeast, other medicines, foods, dyes, or preservatives  -pregnant or trying to get pregnant  -breast-feeding  How should I use this medicine?  This vaccine is for injection into a muscle. It is given by a health care professional.  A copy of Vaccine Information Statements will be given before each vaccination. Read this sheet carefully each time. The sheet may change frequently.  Talk to your pediatrician regarding the use of this medicine in children. While this drug may be prescribed for children as young as newborn for selected conditions, precautions do apply.  Overdosage: If you think you have taken too much of this medicine contact a poison control center or emergency room at once.  NOTE: This medicine is only for you. Do not share this medicine with others.  What if I miss a dose?  It is important not to miss your dose. Call your doctor or health care professional if you are unable to keep an appointment.  What may interact with this medicine?  -medicines that suppress your immune function like adalimumab, anakinra, infliximab  -medicines to treat cancer  -steroid medicines like prednisone or cortisone  This list may not describe all possible interactions. Give your health care provider a list of all the medicines, herbs, non-prescription drugs, or dietary supplements you use. Also tell  them if you smoke, drink alcohol, or use illegal drugs. Some items may interact with your medicine.  What should I watch for while using this medicine?  See your health care provider for all shots of this vaccine as directed. You must have 3 shots of this vaccine for protection from hepatitis B infection. Tell your doctor right away if you have any serious or unusual side effects after getting this vaccine.  What side effects may I notice from receiving this medicine?  Side effects that you should report to your doctor or health care professional as soon as possible:  -allergic reactions like skin rash, itching or hives, swelling of the face, lips, or tongue  -breathing problems  -confused, irritated  -fast, irregular heartbeat  -flu-like syndrome  -numb, tingling pain  -seizures  -unusually weak or tired  Side effects that usually do not require medical attention (report to your doctor or health care professional if they continue or are bothersome):  -diarrhea  -fever  -headache  -loss of appetite  -muscle pain  -nausea  -pain, redness, swelling, or irritation at site where injected  -tiredness  This list may not describe all possible side effects. Call your doctor for medical advice about side effects. You may report side effects to FDA at 1-800-FDA-1088.  Where should I keep my medicine?  This drug is given in a hospital or clinic and will not be stored at home.  NOTE: This sheet is a summary. It may not cover all possible information. If you 

## 2014-08-19 NOTE — Progress Notes (Signed)
[place] for hep B #3

## 2014-09-18 ENCOUNTER — Ambulatory Visit (INDEPENDENT_AMBULATORY_CARE_PROVIDER_SITE_OTHER): Payer: BC Managed Care – PPO | Admitting: Endocrinology

## 2014-09-18 ENCOUNTER — Encounter: Payer: Self-pay | Admitting: Endocrinology

## 2014-09-18 VITALS — BP 139/79 | HR 85 | Temp 97.8°F | Resp 14 | Ht 62.5 in | Wt 117.2 lb

## 2014-09-18 DIAGNOSIS — Z23 Encounter for immunization: Secondary | ICD-10-CM

## 2014-09-18 DIAGNOSIS — E0590 Thyrotoxicosis, unspecified without thyrotoxic crisis or storm: Secondary | ICD-10-CM

## 2014-09-18 DIAGNOSIS — E041 Nontoxic single thyroid nodule: Secondary | ICD-10-CM

## 2014-09-18 LAB — T3, FREE: T3, Free: 2.6 pg/mL (ref 2.3–4.2)

## 2014-09-18 LAB — T4, FREE: FREE T4: 1.17 ng/dL (ref 0.60–1.60)

## 2014-09-18 NOTE — Progress Notes (Signed)
Patient ID: Carol King, female   DOB: 18-May-1951, 63 y.o.   MRN: [id number]                                                                                                               Reason for Appointment:  Hyperthyroidism, new consultation  Referring physician: Silvestre Mesi   History of Present Illness:   She was diagnosed to have hyperthyroidism in 2012 when she was having routine labs for her periodic physical exam She did not appear to have any typical symptoms of hyperthyroidism at that time except possibly weight loss On her exam she did have a soft smooth minimal enlargement of the right lobe and some tremors of her hands Labs had shown high normal free T4 level  She was resumed to have mild increase disease causing hyperthyroidism and started on methimazole 10 mg which was continued and the dose adjusted until her last visit in 04/2012. At that time she was taking 2.5 mg daily However she did not followup subsequently  She did have a mildly abnormal TSH in 2014 but no free T4 when she was with her PCP Currently she has no typical symptoms of hyperthyroidism of weight loss, shakiness, heat intolerance, palpitations or nervousness   She has had  thyroid function tests which showed the following:     Lab Results  Component Value Date   TSH 0.011* 07/15/2014   TSH 0.288* 04/19/2013   She is now referred here for further management of abnormal TSH      Medication List       This list is accurate as of: 09/18/14  3:12 PM.  Always use your most recent med list.               amLODipine 5 MG tablet  Commonly known as:  NORVASC  Take 5 mg by mouth daily.     SUMAtriptan 100 MG tablet  Commonly known as:  IMITREX  Take 1 tablet (100 mg total) by mouth every 2 (two) hours as needed for migraine or headache. May repeat in 2 hours if headache persists or recurs.            Past Medical History  Diagnosis Date  . Thyroid disease   . Skin cancer     BCC, SCC     Past Surgical History  Procedure Laterality Date  . Appendectomy    . Tonsillectomy and adenoidectomy    . Joint replacement Right 07/11/2011    HIP; [facility]  . Abdominal hysterectomy  1981    Family History  Problem Relation Age of Onset  . Cancer Mother   . Cancer Maternal Grandmother   . Kidney disease Maternal Grandfather   . Arthritis Paternal Grandmother     Social History:  reports that she has quit smoking. She has never used smokeless tobacco. She reports that she does not drink alcohol or use illicit drugs.  Allergies:  Allergies  Allergen Reactions  . Darvon [Propoxyphene Hcl]     "climb the walls"  .  Vicodin [Hydrocodone-Acetaminophen]     "climb the walls"    Review of Systems:  Has a  history of high blood pressure treated with amlodipine.       Has prior history of migraines No history of dyspnea on exertion.      No complaints of change in bowel habits.      Negative  history of Diabetes.     She does have problems with osteoarthritis of hip and may require hip replacement next year No swelling of feet         Examination:   BP 139/79  Pulse 85  Temp(Src) 97.8 F (36.6 C)  Resp 14  Ht 5' 2.5" (1.588 m)  Wt 117 lb 3.2 oz (53.162 kg)  BMI 21.08 kg/m2  SpO2 97%   General Appearance:  she is thinly built but well nourished, pleasant, not anxious or hyperkinetic..        Eyes: No  prominence, lid lag or stare. No swelling of the eyelids  Neck: The thyroid exam shows a 1.5-2 cm smooth slightly firm right lobe nodule, left side not palpable. The isthmus is not palpable There is no lymphadenopathy .          Heart: normal S1 and S2   Extremities: hands show normal temperature. No ankle edema. Neurological: REFLEXES: at biceps are normal and there is no tremor present   Assessment/Plan:   Hyperthyroidism, mild and asymptomatic She was previously treated for over a year with methimazole but subsequently did not followup Although her TSH last year  was not in the hyperthyroid range it appears to be significantly suppressed and most likely she is having a recurrence of her hyperthyroidism However exam she may have a thyroid nodule raising the possibility of a toxic nodule rather than Graves' disease She is very adamant about not wanting radioactive iodine for thyroid ablation  For now will check her free T4 and free T3 levels and decide on further treatment She will need a thyroid scan before considering treatment options Patient understands the above discussion and treatment options. All questions were answered satisfactorily   Nayali Talerico 09/18/2014, 3:12 PM   Addendum: Thyroid levels are normal as below, no need for treatment at this time but will get thyroid scan to evaluate the Right lobe nodule  Office Visit on 09/18/2014  Component Date Value Ref Range Status  . Free T4 09/18/2014 1.17  0.60 - 1.60 ng/dL Final  . T3, Free 09/18/2014 2.6  2.3 - 4.2 pg/mL Final

## 2014-09-18 NOTE — Progress Notes (Signed)
Quick Note:  Please let patient know that the lab result is normal and no treatment needed but would like to still do thyroid scan to evaluate right sided enlargement, have ordered this ______

## 2014-09-19 NOTE — Addendum Note (Signed)
Addended by: Elayne Snare on: 09/19/2014 11:04 AM   Modules accepted: Orders

## 2014-10-08 ENCOUNTER — Encounter (HOSPITAL_COMMUNITY)
Admission: RE | Admit: 2014-10-08 | Discharge: 2014-10-08 | Disposition: A | Discharge status: Home / Self Care | Payer: BC Managed Care – PPO | Source: Ambulatory Visit | Attending: Endocrinology | Admitting: Endocrinology

## 2014-10-08 DIAGNOSIS — E041 Nontoxic single thyroid nodule: Secondary | ICD-10-CM

## 2014-10-09 ENCOUNTER — Encounter (HOSPITAL_COMMUNITY)
Admission: RE | Admit: 2014-10-09 | Discharge: 2014-10-09 | Disposition: A | Discharge status: Home / Self Care | Payer: BC Managed Care – PPO | Source: Ambulatory Visit | Attending: Endocrinology | Admitting: Endocrinology

## 2014-10-09 DIAGNOSIS — E041 Nontoxic single thyroid nodule: Secondary | ICD-10-CM | POA: Diagnosis present

## 2014-10-09 MED ORDER — SODIUM PERTECHNETATE TC 99M INJECTION: 10.5000 | Freq: Once | INTRAVENOUS | Status: AC | PRN

## 2014-10-09 MED ORDER — SODIUM IODIDE I 131 CAPSULE: 14.0000 | Freq: Once | INTRAVENOUS | Status: AC | PRN

## 2014-10-13 ENCOUNTER — Other Ambulatory Visit: Payer: Self-pay | Admitting: Endocrinology

## 2014-10-13 DIAGNOSIS — E041 Nontoxic single thyroid nodule: Secondary | ICD-10-CM

## 2014-10-13 NOTE — Progress Notes (Signed)
Quick Note:  Call: Thyroid scan shows decreased activity in the nodule that was felt, will evaluate this with thyroid ultrasound , this will be ordered ______

## 2014-10-21 ENCOUNTER — Ambulatory Visit
Admission: RE | Admit: 2014-10-21 | Discharge: 2014-10-21 | Disposition: A | Discharge status: Home / Self Care | Payer: BC Managed Care – PPO | Source: Ambulatory Visit | Attending: Endocrinology | Admitting: Endocrinology

## 2014-10-21 DIAGNOSIS — E041 Nontoxic single thyroid nodule: Secondary | ICD-10-CM

## 2014-10-28 ENCOUNTER — Other Ambulatory Visit: Payer: BC Managed Care – PPO

## 2014-10-31 ENCOUNTER — Ambulatory Visit: Payer: Self-pay | Admitting: Endocrinology

## 2015-03-06 ENCOUNTER — Telehealth: Payer: Self-pay | Admitting: Family Medicine

## 2015-03-06 NOTE — Telephone Encounter (Signed)
Called her- I have received a request for pre- op clearance for her hip.  Asked her to please come and see me soon for a visit to do clearance.  LMOM

## 2015-03-10 ENCOUNTER — Ambulatory Visit (INDEPENDENT_AMBULATORY_CARE_PROVIDER_SITE_OTHER): Payer: BLUE CROSS/BLUE SHIELD | Admitting: Family Medicine

## 2015-03-10 VITALS — BP 137/75 | HR 85 | Temp 97.9°F | Resp 16 | Ht 63.0 in | Wt 106.6 lb

## 2015-03-10 DIAGNOSIS — J029 Acute pharyngitis, unspecified: Secondary | ICD-10-CM

## 2015-03-10 DIAGNOSIS — Z01818 Encounter for other preprocedural examination: Secondary | ICD-10-CM | POA: Diagnosis not present

## 2015-03-10 DIAGNOSIS — G43809 Other migraine, not intractable, without status migrainosus: Secondary | ICD-10-CM

## 2015-03-10 DIAGNOSIS — I10 Essential (primary) hypertension: Secondary | ICD-10-CM | POA: Diagnosis not present

## 2015-03-10 LAB — POCT CBC
Granulocyte percent: 54.1 %G (ref 37–80)
HCT, POC: 41.4 % (ref 37.7–47.9)
HEMOGLOBIN: 13.4 g/dL (ref 12.2–16.2)
LYMPH, POC: 2.5 (ref 0.6–3.4)
MCH, POC: 29.3 pg (ref 27–31.2)
MCHC: 32.5 g/dL (ref 31.8–35.4)
MCV: 90.2 fL (ref 80–97)
MID (cbc): 0.4 (ref 0–0.9)
MPV: 6.8 fL (ref 0–99.8)
POC GRANULOCYTE: 3.5 (ref 2–6.9)
POC LYMPH PERCENT: 39.3 %L (ref 10–50)
POC MID %: 6.6 %M (ref 0–12)
Platelet Count, POC: 261 10*3/uL (ref 142–424)
RBC: 4.58 M/uL (ref 4.04–5.48)
RDW, POC: 13.3 %
WBC: 6.4 10*3/uL (ref 4.6–10.2)

## 2015-03-10 LAB — COMPREHENSIVE METABOLIC PANEL
ALBUMIN: 3.7 g/dL (ref 3.5–5.2)
ALT: 32 U/L (ref 0–35)
AST: 33 U/L (ref 0–37)
Alkaline Phosphatase: 68 U/L (ref 39–117)
BUN: 16 mg/dL (ref 6–23)
CALCIUM: 9.8 mg/dL (ref 8.4–10.5)
CHLORIDE: 102 meq/L (ref 96–112)
CO2: 30 mEq/L (ref 19–32)
CREATININE: 0.7 mg/dL (ref 0.50–1.10)
GLUCOSE: 92 mg/dL (ref 70–99)
POTASSIUM: 3.9 meq/L (ref 3.5–5.3)
Sodium: 141 mEq/L (ref 135–145)
TOTAL PROTEIN: 6.2 g/dL (ref 6.0–8.3)
Total Bilirubin: 0.2 mg/dL (ref 0.2–1.2)

## 2015-03-10 LAB — POCT RAPID STREP A (OFFICE): Rapid Strep A Screen: NEGATIVE

## 2015-03-10 MED ORDER — SUMATRIPTAN SUCCINATE 100 MG PO TABS: 100.0000 mg | ORAL_TABLET | ORAL | Status: DC | PRN

## 2015-03-10 MED ORDER — SUMATRIPTAN SUCCINATE 100 MG PO TABS: ORAL_TABLET | ORAL | Status: DC

## 2015-03-10 MED ORDER — AMLODIPINE BESYLATE 10 MG PO TABS: 10.0000 mg | ORAL_TABLET | Freq: Every day | ORAL | Status: DC

## 2015-03-10 NOTE — Progress Notes (Signed)
[facility] [address], [place] [address] [phone]  Date:  03/10/2015   Name:  Carol King   DOB:  [birth year]   MRN:  [id number]  PCP:  Lamar Blinks, MD    Chief Complaint: Medical Clearance; Sore Throat; and Medication Refill   History of Present Illness:  Carol King is a 64 y.o. very pleasant female patient who presents with the following:  Her right hip was replaced by Dr. Alvan Dame in 2012.  She plans to have a left hip done as well soon- 05/06/15. She is excited to have this done as the pain is starting to really bother her.  She was recently exposed to strep at work; has noted a ST for a few days and wanted to be sure not strep.  She has noted a little bit of left ear discomfort, she has felt tired and worn out but her work at [employer] has been really busy with the recent [place] and Easter seasons.   She has really no cough.  Does not tend to have allergies  No history of heart trouble, does have mild HTN which is controlled.  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 She did have possible hyperthyroidism in the past but reports that her follow-up visit with endocrine showed that all was ok  Patient Active Problem List   Diagnosis Date Noted  . Fibromyalgia 01/10/2014  . Post-menopausal 01/10/2014  . HTN (hypertension) 04/19/2013  . Migraine 04/19/2013  . 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 Hyperthyroidism 04/19/2013    Past Medical History  Diagnosis Date  . Thyroid disease   . Skin cancer     BCC, SCC    Past Surgical History  Procedure Laterality Date  . Appendectomy    . Tonsillectomy and adenoidectomy    . Joint replacement Right 07/11/2011    HIP; Alvan Dame  . Abdominal hysterectomy  1981    History  Substance Use Topics  . Smoking status: Former Research scientist (life sciences)  . Smokeless tobacco: Former Systems developer    Quit date: 03/09/1993  . Alcohol Use: No    Family History   Problem Relation Age of Onset  . Cancer Mother   . Cancer Maternal Grandmother   . Kidney disease Maternal Grandfather   . Arthritis Paternal Grandmother     Allergies  Allergen Reactions  . Darvon [Propoxyphene Hcl]     "climb the walls"  . Vicodin [Hydrocodone-Acetaminophen]     "climb the walls"    Medication list has been reviewed and updated.  Current Outpatient Prescriptions on File Prior to Visit  Medication Sig Dispense Refill  . SUMAtriptan (IMITREX) 100 MG tablet Take 1 tablet (100 mg total) by mouth every 2 (two) hours as needed for migraine or headache. May repeat in 2 hours if headache persists or recurs. 9 tablet 12   No current facility-administered medications on file prior to visit.    Review of Systems:  As per HPI- otherwise negative.   Physical Examination: Filed Vitals:   03/10/15 1329  BP: 137/75  Pulse: 85  Temp: 97.9 F (36.6 C)  Resp: 16   Filed Vitals:   03/10/15 1329  Height: 5\' 3"  (1.6 m)  Weight: 106 lb 9.6 oz (48.353 kg)   Body mass index is 18.89 kg/(m^2). Ideal Body Weight: Weight in (lb) to have BMI = 25: 140.8  GEN: WDWN, NAD, Non-toxic, A & O x  3, slim build, looks well HEENT: Atraumatic, Normocephalic. Neck supple. No masses, No LAD.  Bilateral TM wnl, oropharynx normal.  PEERL,EOMI.   Ears and Nose: No external deformity. CV: RRR, No M/G/R. No JVD. No thrill. No extra heart sounds. PULM: CTA B, no wheezes, crackles, rhonchi. No retractions. No resp. distress. No accessory muscle use. ABD: S, NT, ND, +BS. No rebound. No HSM. EXTR: No c/c/e NEURO Normal gait.  PSYCH: Normally interactive. Conversant. Not depressed or anxious appearing.  Calm demeanor.   Results for orders placed or performed in visit on 03/10/15  POCT CBC  Result Value Ref Range   WBC 6.4 4.6 - 10.2 K/uL   Lymph, poc 2.5 0.6 - 3.4   POC LYMPH PERCENT 39.3 10 - 50 %L   MID (cbc) 0.4 0 - 0.9   POC MID % 6.6 0 - 12 %M   POC Granulocyte 3.5 2 - 6.9    Granulocyte percent 54.1 37 - 80 %G   RBC 4.58 4.04 - 5.48 M/uL   Hemoglobin 13.4 12.2 - 16.2 g/dL   HCT, POC 41.4 37.7 - 47.9 %   MCV 90.2 80 - 97 fL   MCH, POC 29.3 27 - 31.2 pg   MCHC 32.5 31.8 - 35.4 g/dL   RDW, POC 13.3 %   Platelet Count, POC 261 142 - 424 K/uL   MPV 6.8 0 - 99.8 fL  POCT rapid strep A  Result Value Ref Range   Rapid Strep A Screen Negative Negative     Assessment and Plan: Pre-operative exam - Plan: POCT CBC, Comprehensive metabolic panel, Hemoglobin A1c  Essential hypertension - Plan: amLODipine (NORVASC) 10 MG tablet  Other migraine without status migrainosus, not intractable - Plan: SUMAtriptan (IMITREX) 100 MG tablet, DISCONTINUED: SUMAtriptan (IMITREX) 100 MG tablet  Sore throat - Plan: POCT rapid strep A  No evidence of strep- possible allergies.  Discussed with her Refilled her medications as above BP controlled Await labs but expect that I will be able to clear her for surgery Signed Lamar Blinks, MD

## 2015-03-10 NOTE — Patient Instructions (Addendum)
Great to see you today!  I will be in touch with your labs but assuming all looks good I will fax your surgical clearance to Dr. Alvan Dame  Try some OTC allergy medication for your sore throat - claritin or zyrtec may be helpful  Try to get some rest prior to your surgery

## 2015-03-11 ENCOUNTER — Encounter: Payer: Self-pay | Admitting: Family Medicine

## 2015-03-11 LAB — HEMOGLOBIN A1C
Hgb A1c MFr Bld: 5.3 % (ref <5.7)
Mean Plasma Glucose: 105 mg/dL (ref <117)

## 2015-04-29 NOTE — H&P (Signed)
TOTAL HIP ADMISSION H&P  Patient is admitted for left total hip arthroplasty.  Subjective:  Chief Complaint: left hip pain  HPI: Carol King, 64 y.o. female, has a history of pain and functional disability in the left hip(s) due to arthritis and patient has failed non-surgical conservative treatments for greater than 12 weeks to include NSAID's and/or analgesics, corticosteriod injections, use of assistive devices and activity modification.  Onset of symptoms was gradual starting 2 years ago with gradually worsening course since that time.The patient noted no past surgery on the left hip(s).  Patient currently rates pain in the left hip at 6 out of 10 with activity. Patient has night pain, worsening of pain with activity and weight bearing, trendelenberg gait, pain that interfers with activities of daily living, pain with passive range of motion and crepitus. Patient has evidence of periarticular osteophytes and joint space narrowing by imaging studies. This condition presents safety issues increasing the risk of falls.  There is no current active infection.  Patient Active Problem List   Diagnosis Date Noted  . Fibromyalgia 01/10/2014  . Post-menopausal 01/10/2014  . HTN (hypertension) 04/19/2013  . Migraine 04/19/2013  . 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 Hyperthyroidism 04/19/2013   Past Medical History  Diagnosis Date  . Thyroid disease   . Skin cancer     BCC, SCC    Past Surgical History  Procedure Laterality Date  . Appendectomy    . Tonsillectomy and adenoidectomy    . Joint replacement Right 07/11/2011    HIP; Alvan Dame  . Abdominal hysterectomy  1981     Current outpatient prescriptions:  .  amLODipine (NORVASC) 10 MG tablet, Take 1 tablet (10 mg total) by mouth daily. (Patient taking differently: Take 10 mg by mouth at bedtime. ), Disp: 90 tablet, Rfl: 3 .  Ascorbic Acid (VITAMIN C PO), Take 1 tablet by mouth every morning. Time Release - Emiliano Dyer., Disp: , Rfl:  .   aspirin-acetaminophen-caffeine (EXCEDRIN MIGRAINE) 250-250-65 MG per tablet, Take 1-2 tablets by mouth every 6 (six) hours as needed for headache., Disp: , Rfl:  .  CALCIUM PO, Take 2 tablets by mouth every morning., Disp: , Rfl:  .  Glucosamine-Chondroitin (COSAMIN DS PO), Take 1 tablet by mouth every morning., Disp: , Rfl:  .  ibuprofen (ADVIL,MOTRIN) 200 MG tablet, Take 400 mg by mouth every 6 (six) hours as needed for headache or moderate pain., Disp: , Rfl:  .  loratadine (CLARITIN) 10 MG tablet, Take 10 mg by mouth every morning., Disp: , Rfl:  .  Magnesium 250 MG TABS, Take 500 mg by mouth every morning. Emiliano Dyer., Disp: , Rfl:  .  Multiple Vitamin (MULTIVITAMIN WITH MINERALS) TABS tablet, Take 2 tablets by mouth every morning., Disp: , Rfl:  .  OVER THE COUNTER MEDICATION, Take 1 tablet by mouth every morning. Adaptamax--Nature Sunshine., Disp: , Rfl:  .  OVER THE COUNTER MEDICATION, 1 tablet every morning. Anxious Leg- Emiliano Dyer., Disp: , Rfl:  .  OVER THE COUNTER MEDICATION, Take 1 tablet by mouth every morning. Stress J- Nature Sunshine., Disp: , Rfl:  .  OVER THE COUNTER MEDICATION, Take 1 tablet by mouth every morning. [place]., Disp: , Rfl:  .  OVER THE COUNTER MEDICATION, Take 1 tablet by mouth every morning. Mood Elevator- Emiliano Dyer., Disp: , Rfl:  .  OVER THE COUNTER MEDICATION, Take 1 tablet by mouth 2 (two) times daily. [place]., Disp: , Rfl:  .  OVER THE COUNTER MEDICATION, Take 1 tablet by mouth 2 (two) times daily. Enviro Detox- [employer]., Disp: , Rfl:  .  OVER THE COUNTER MEDICATION, Take 2 tablets by mouth at bedtime., Disp: , Rfl:  .  OVER THE COUNTER MEDICATION, Take 2 tablets by mouth every morning. Super Supplemental - Emiliano Dyer., Disp: , Rfl:  .  OVER THE COUNTER MEDICATION, Take 2 tablets by mouth every morning. Skeletal Strength- Natural Sunshine., Disp: , Rfl:  .  OVER THE COUNTER  MEDICATION, Take 1 tablet by mouth every morning. [place]., Disp: , Rfl:  .  OVER THE COUNTER MEDICATION, Take 1 tablet by mouth every morning. Sinus Support, Disp: , Rfl:  .  OVER THE COUNTER MEDICATION, Take 1 tablet by mouth every morning. Ever Flex- [employer]., Disp: , Rfl:  .  OVER THE COUNTER MEDICATION, Take 1 tablet by mouth every morning. Fibralgia, Disp: , Rfl:  .  OVER THE COUNTER MEDICATION, Take 1 tablet by mouth every morning. Liver Cleanse--Nature Sunshine., Disp: , Rfl:  .  SUMAtriptan (IMITREX) 100 MG tablet, Take 1 as needed for migraineMay repeat in 2 hours if headache persists or recurs., Disp: 9 tablet, Rfl: 6 .  TURMERIC PO, Take 1 tablet by mouth every morning., Disp: , Rfl:  .  Vitamins A & D (VITAMIN A & D PO), Take 1 tablet by mouth every morning. Nature sunshine, Disp: , Rfl:  .  Vitamins-Lipotropics (LIPO-FLAVONOID PLUS) TABS, Take 2 tablets by mouth 2 (two) times daily., Disp: , Rfl:   Allergies  Allergen Reactions  . Darvon [Propoxyphene Hcl]     "climb the walls"  . Vicodin [Hydrocodone-Acetaminophen]     "climb the walls"    History  Substance Use Topics  . Smoking status: Former Research scientist (life sciences)  . Smokeless tobacco: Former Systems developer    Quit date: 03/09/1993  . Alcohol Use: No    Family History  Problem Relation Age of Onset  . Cancer Mother   . Cancer Maternal Grandmother   . Kidney disease Maternal Grandfather   . Arthritis Paternal Grandmother      Review of Systems  Constitutional: Negative.   HENT: Negative for congestion, ear discharge, ear pain, hearing loss, nosebleeds, sore throat and tinnitus.   Eyes: Negative.   Respiratory: Negative.  Negative for stridor.   Cardiovascular: Negative.   Gastrointestinal: Negative.   Genitourinary: Negative.   Musculoskeletal: Positive for joint pain. Negative for myalgias, back pain, falls and neck pain.       Left hip pain  Skin: Negative.   Neurological: Positive for headaches. Negative for  dizziness, tingling, tremors, sensory change, speech change, focal weakness, seizures and loss of consciousness.  Endo/Heme/Allergies: Negative.   Psychiatric/Behavioral: Negative.     Objective:  Physical Exam  Constitutional: She is oriented to person, place, and time. She appears well-developed and well-nourished. No distress.  HENT:  Head: Normocephalic and atraumatic.  Right Ear: External ear normal.  Left Ear: External ear normal.  Nose: Nose normal.  Mouth/Throat: Oropharynx is clear and moist.  Eyes: Conjunctivae and EOM are normal.  Neck: Normal range of motion. Neck supple.  Cardiovascular: Normal rate, regular rhythm, normal heart sounds and intact distal pulses.   No murmur heard. Respiratory: Effort normal and breath sounds normal. No respiratory distress. She has no wheezes.  GI: Soft. Bowel sounds are normal. She exhibits no distension. There is no tenderness.  Musculoskeletal:       Right hip: Normal.  Left hip: She exhibits decreased range of motion and crepitus.       Right knee: Normal.       Left knee: Normal.  Neurological: She is alert and oriented to person, place, and time. She has normal strength and normal reflexes. No sensory deficit.  Skin: She is not diaphoretic. No erythema.  Psychiatric: She has a normal mood and affect. Her behavior is normal.    Vitals  Weight: 105 lb Height: 63in Body Surface Area: 1.47 m Body Mass Index: 18.6 kg/m  Pulse: 72 (Regular)  BP: 148/82 (Sitting, Left Arm, Standard)  Imaging Review Plain radiographs demonstrate severe degenerative joint disease of the left hip(s). The bone quality appears to be good for age and reported activity level.  Assessment/Plan:  End stage arthritis, left hip(s)  The patient history, physical examination, clinical judgement of the provider and imaging studies are consistent with end stage degenerative joint disease of the left hip(s) and total hip arthroplasty is deemed  medically necessary. The treatment options including medical management, injection therapy, arthroscopy and arthroplasty were discussed at length. The risks and benefits of total hip arthroplasty were presented and reviewed. The risks due to aseptic loosening, infection, stiffness, dislocation/subluxation,  thromboembolic complications and other imponderables were discussed.  The patient acknowledged the explanation, agreed to proceed with the plan and consent was signed. Patient is being admitted for inpatient treatment for surgery, pain control, PT, OT, prophylactic antibiotics, VTE prophylaxis, progressive ambulation and ADL's and discharge planning.The patient is planning to be discharged home with home health services    TXA IV PCP: Dr. Edilia Bo ([facility])    Ardeen Jourdain, [place]

## 2015-05-01 ENCOUNTER — Encounter (HOSPITAL_COMMUNITY): Payer: Self-pay

## 2015-05-01 ENCOUNTER — Encounter (HOSPITAL_COMMUNITY)
Admission: RE | Admit: 2015-05-01 | Discharge: 2015-05-01 | Disposition: A | Discharge status: Home / Self Care | Payer: BLUE CROSS/BLUE SHIELD | Source: Ambulatory Visit | Attending: Orthopedic Surgery | Admitting: Orthopedic Surgery

## 2015-05-01 DIAGNOSIS — Z01818 Encounter for other preprocedural examination: Secondary | ICD-10-CM | POA: Diagnosis not present

## 2015-05-01 DIAGNOSIS — I252 Old myocardial infarction: Secondary | ICD-10-CM | POA: Diagnosis not present

## 2015-05-01 DIAGNOSIS — Z22321 Carrier or suspected carrier of Methicillin susceptible Staphylococcus aureus: Secondary | ICD-10-CM | POA: Diagnosis not present

## 2015-05-01 DIAGNOSIS — I10 Essential (primary) hypertension: Secondary | ICD-10-CM | POA: Diagnosis not present

## 2015-05-01 HISTORY — DX: Other specified postprocedural states: R11.2

## 2015-05-01 HISTORY — DX: Personal history of other malignant neoplasm of skin: Z85.828

## 2015-05-01 HISTORY — DX: Headache: R51

## 2015-05-01 HISTORY — DX: Other complications of anesthesia, initial encounter: T88.59XA

## 2015-05-01 HISTORY — DX: Adverse effect of unspecified anesthetic, initial encounter: T41.45XA

## 2015-05-01 HISTORY — DX: Essential (primary) hypertension: I10

## 2015-05-01 HISTORY — DX: Other specified postprocedural states: Z98.890

## 2015-05-01 HISTORY — DX: Sleep disorder, unspecified: G47.9

## 2015-05-01 HISTORY — DX: Unspecified osteoarthritis, unspecified site: M19.90

## 2015-05-01 HISTORY — DX: Headache, unspecified: R51.9

## 2015-05-01 LAB — PROTIME-INR
INR: 0.95 (ref 0.00–1.49)
Prothrombin Time: 12.9 seconds (ref 11.6–15.2)

## 2015-05-01 LAB — SURGICAL PCR SCREEN
MRSA, PCR: NEGATIVE
STAPHYLOCOCCUS AUREUS: POSITIVE — AB

## 2015-05-01 LAB — BASIC METABOLIC PANEL
Anion gap: 8 (ref 5–15)
BUN: 21 mg/dL — ABNORMAL HIGH (ref 6–20)
CO2: 32 mmol/L (ref 22–32)
Calcium: 9.8 mg/dL (ref 8.9–10.3)
Chloride: 103 mmol/L (ref 101–111)
Creatinine, Ser: 0.83 mg/dL (ref 0.44–1.00)
GFR calc non Af Amer: >60 mL/min (ref >60)
Glucose, Bld: 76 mg/dL (ref 65–99)
Potassium: 4.1 mmol/L (ref 3.5–5.1)
Sodium: 143 mmol/L (ref 135–145)

## 2015-05-01 LAB — URINALYSIS, ROUTINE W REFLEX MICROSCOPIC
Bilirubin Urine: NEGATIVE
GLUCOSE, UA: NEGATIVE mg/dL
Hgb urine dipstick: NEGATIVE
KETONES UR: NEGATIVE mg/dL
Leukocytes, UA: NEGATIVE
Nitrite: NEGATIVE
Protein, ur: NEGATIVE mg/dL
Specific Gravity, Urine: 1.018 (ref 1.005–1.030)
Urobilinogen, UA: 0.2 mg/dL (ref 0.0–1.0)
pH: 6 (ref 5.0–8.0)

## 2015-05-01 LAB — CBC
HCT: 38.5 % (ref 36.0–46.0)
Hemoglobin: 13 g/dL (ref 12.0–15.0)
MCH: 29.6 pg (ref 26.0–34.0)
MCHC: 33.8 g/dL (ref 30.0–36.0)
MCV: 87.7 fL (ref 78.0–100.0)
PLATELETS: 272 10*3/uL (ref 150–400)
RBC: 4.39 MIL/uL (ref 3.87–5.11)
RDW: 12.5 % (ref 11.5–15.5)
WBC: 6 10*3/uL (ref 4.0–10.5)

## 2015-05-01 LAB — APTT: APTT: 31 s (ref 24–37)

## 2015-05-01 NOTE — Patient Instructions (Addendum)
YOUR PROCEDURE IS SCHEDULED ON : 05/06/15  REPORT TO [place] MAIN ENTRANCE FOLLOW SIGNS TO SHORT STAY CENTER AT :  5:30 AM  CALL THIS NUMBER IF YOU HAVE PROBLEMS THE MORNING OF SURGERY [phone]  REMEMBER:  DO NOT EAT FOOD OR DRINK LIQUIDS AFTER MIDNIGHT  TAKE THESE MEDICINES THE MORNING OF SURGERY:  NONE  YOU MAY NOT HAVE ANY METAL ON YOUR BODY INCLUDING HAIR PINS AND PIERCING'S. DO NOT WEAR JEWELRY, MAKEUP, LOTIONS, POWDERS OR PERFUMES. DO NOT WEAR NAIL POLISH. DO NOT SHAVE 48 HRS PRIOR TO SURGERY. MEN MAY SHAVE FACE AND NECK.  DO NOT [place]. [facility] IS NOT RESPONSIBLE FOR VALUABLES.  CONTACTS, DENTURES OR PARTIALS MAY NOT BE WORN TO SURGERY. LEAVE SUITCASE IN CAR. CAN BE BROUGHT TO ROOM AFTER SURGERY.  PATIENTS DISCHARGED THE DAY OF SURGERY WILL NOT BE ALLOWED TO DRIVE HOME.  PLEASE READ OVER THE FOLLOWING INSTRUCTION SHEETS _________________________________________________________________________________                                           - PREPARING FOR SURGERY  Before surgery, you can play an important role.  Because skin is not sterile, your skin needs to be as free of germs as possible.  You can reduce the number of germs on your skin by washing with CHG (chlorahexidine gluconate) soap before surgery.  CHG is an antiseptic cleaner which kills germs and bonds with the skin to continue killing germs even after washing. Please DO NOT use if you have an allergy to CHG or antibacterial soaps.  If your skin becomes reddened/irritated stop using the CHG and inform your nurse when you arrive at Short Stay. Do not shave (including legs and underarms) for at least 48 hours prior to the first CHG shower.  You may shave your face. Please follow these instructions carefully:   1.  Shower with CHG Soap the night before surgery and the  morning of Surgery.   2.  If you choose to wash your hair, wash your hair first as  usual with your  normal  Shampoo.   3.  After you shampoo, rinse your hair and body thoroughly to remove the  shampoo.                                         4.  Use CHG as you would any other liquid soap.  You can apply chg directly  to the skin and wash . Gently wash with scrungie or clean wascloth    5.  Apply the CHG Soap to your body ONLY FROM THE NECK DOWN.   Do not use on open                           Wound or open sores. Avoid contact with eyes, ears mouth and genitals (private parts).                        Genitals (private parts) with your normal soap.              6.  Wash thoroughly, paying special attention to the area where your surgery  will be performed.  7.  Thoroughly rinse your body with warm water from the neck down.   8.  DO NOT shower/wash with your normal soap after using and rinsing off  the CHG Soap .                9.  Pat yourself dry with a clean towel.             10.  Wear clean night clothes to bed after shower             11.  Place clean sheets on your bed the night of your first shower and do not  sleep with pets.  Day of Surgery : Do not apply any lotions/deodorants the morning of surgery.  Please wear clean clothes to the hospital/surgery center.  FAILURE TO FOLLOW THESE INSTRUCTIONS MAY RESULT IN THE CANCELLATION OF YOUR SURGERY    PATIENT SIGNATURE_________________________________  ______________________________________________________________________     Adam Phenix  An incentive spirometer is a tool that can help keep your lungs clear and active. This tool measures how well you are filling your lungs with each breath. Taking long deep breaths may help reverse or decrease the chance of developing breathing (pulmonary) problems (especially infection) following:  A long period of time when you are unable to move or be active. BEFORE THE PROCEDURE   If the spirometer includes an indicator to show your best effort, your nurse  or respiratory therapist will set it to a desired goal.  If possible, sit up straight or lean slightly forward. Try not to slouch.  Hold the incentive spirometer in an upright position. INSTRUCTIONS FOR USE   Sit on the edge of your bed if possible, or sit up as far as you can in bed or on a chair.  Hold the incentive spirometer in an upright position.  Breathe out normally.  Place the mouthpiece in your mouth and seal your lips tightly around it.  Breathe in slowly and as deeply as possible, raising the piston or the ball toward the top of the column.  Hold your breath for 3-5 seconds or for as long as possible. Allow the piston or ball to fall to the bottom of the column.  Remove the mouthpiece from your mouth and breathe out normally.  Rest for a few seconds and repeat Steps 1 through 7 at least 10 times every 1-2 hours when you are awake. Take your time and take a few normal breaths between deep breaths.  The spirometer may include an indicator to show your best effort. Use the indicator as a goal to work toward during each repetition.  After each set of 10 deep breaths, practice coughing to be sure your lungs are clear. If you have an incision (the cut made at the time of surgery), support your incision when coughing by placing a pillow or rolled up towels firmly against it. Once you are able to get out of bed, walk around indoors and cough well. You may stop using the incentive spirometer when instructed by your caregiver.  RISKS AND COMPLICATIONS  Take your time so you do not get dizzy or light-headed.  If you are in pain, you may need to take or ask for pain medication before doing incentive spirometry. It is harder to take a deep breath if you are having pain. AFTER USE  Rest and breathe slowly and easily.  It can be helpful to keep track of a log of your progress. Your caregiver can provide you  with a simple table to help with this. If you are using the spirometer at  home, follow these instructions: [place] IF:   You are having difficultly using the spirometer.  You have trouble using the spirometer as often as instructed.  Your pain medication is not giving enough relief while using the spirometer.  You develop fever of 100.5 F (38.1 C) or higher. SEEK IMMEDIATE MEDICAL CARE IF:   You cough up bloody sputum that had not been present before.  You develop fever of 102 F (38.9 C) or greater.  You develop worsening pain at or near the incision site. MAKE SURE YOU:   Understand these instructions.  Will watch your condition.  Will get help right away if you are not doing well or get worse. Document Released: 04/11/2007 Document Revised: 02/21/2012 Document Reviewed: 06/12/2007 Exit[employer] Patient Information 2014 [employer], [place].   ________________________________________________________________________  WHAT IS A BLOOD TRANSFUSION? Blood Transfusion Information  A transfusion is the replacement of blood or some of its parts. Blood is made up of multiple cells which provide different functions.  Red blood cells carry oxygen and are used for blood loss replacement.  White blood cells fight against infection.  Platelets control bleeding.  Plasma helps clot blood.  Other blood products are available for specialized needs, such as hemophilia or other clotting disorders. BEFORE THE TRANSFUSION  Who gives blood for transfusions?   Healthy volunteers who are fully evaluated to make sure their blood is safe. This is blood bank blood. Transfusion therapy is the safest it has ever been in the practice of medicine. Before blood is taken from a donor, a complete history is taken to make sure that person has no history of diseases nor engages in risky social behavior (examples are intravenous drug use or sexual activity with multiple partners). The donor's travel history is screened to minimize risk of transmitting infections, such as  malaria. The donated blood is tested for signs of infectious diseases, such as HIV and hepatitis. The blood is then tested to be sure it is compatible with you in order to minimize the chance of a transfusion reaction. If you or a relative donates blood, this is often done in anticipation of surgery and is not appropriate for emergency situations. It takes many days to process the donated blood. RISKS AND COMPLICATIONS Although transfusion therapy is very safe and saves many lives, the main dangers of transfusion include:   Getting an infectious disease.  Developing a transfusion reaction. This is an allergic reaction to something in the blood you were given. Every precaution is taken to prevent this. The decision to have a blood transfusion has been considered carefully by your caregiver before blood is given. Blood is not given unless the benefits outweigh the risks. AFTER THE TRANSFUSION  Right after receiving a blood transfusion, you will usually feel much better and more energetic. This is especially true if your red blood cells have gotten low (anemic). The transfusion raises the level of the red blood cells which carry oxygen, and this usually causes an energy increase.  The nurse administering the transfusion will monitor you carefully for complications. HOME CARE INSTRUCTIONS  No special instructions are needed after a transfusion. You may find your energy is better. Speak with your caregiver about any limitations on activity for underlying diseases you may have. SEEK MEDICAL CARE IF:   Your condition is not improving after your transfusion.  You develop redness or irritation at the intravenous (IV) site.  SEEK IMMEDIATE MEDICAL CARE IF:  Any of the following symptoms occur over the next 12 hours:  Shaking chills.  You have a temperature by mouth above 102 F (38.9 C), not controlled by medicine.  Chest, back, or muscle pain.  People around you feel you are not acting correctly  or are confused.  Shortness of breath or difficulty breathing.  Dizziness and fainting.  You get a rash or develop hives.  You have a decrease in urine output.  Your urine turns a dark color or changes to pink, red, or brown. Any of the following symptoms occur over the next 10 days:  You have a temperature by mouth above 102 F (38.9 C), not controlled by medicine.  Shortness of breath.  Weakness after normal activity.  The white part of the eye turns yellow (jaundice).  You have a decrease in the amount of urine or are urinating less often.  Your urine turns a dark color or changes to pink, red, or brown. Document Released: 11/26/2000 Document Revised: 02/21/2012 Document Reviewed: 07/15/2008 [facility] Patient Information 2014 [place], [place].  _______________________________________________________________________

## 2015-05-02 NOTE — Progress Notes (Signed)
+  PCR staph - pt notified / Rx Mupuricin called to [facility] [phone]

## 2015-05-05 NOTE — Anesthesia Preprocedure Evaluation (Addendum)
Anesthesia Evaluation  Patient identified by MRN, date of birth, ID band Patient awake    Reviewed: Allergy & Precautions, H&P , NPO status , Patient's Chart, lab work & pertinent test results  History of Anesthesia Complications (+) PONV  Airway Mallampati: II  TM Distance: >3 FB Neck ROM: full    Dental no notable dental hx. (+) Teeth Intact, Dental Advisory Given   Pulmonary former smoker,  breath sounds clear to auscultation  Pulmonary exam normal       Cardiovascular hypertension, Pt. on medications Normal cardiovascular examRhythm:regular Rate:Normal     Neuro/Psych  Headaches, negative neurological ROS  negative psych ROS   GI/Hepatic negative GI ROS, Neg liver ROS,   Endo/Other  negative endocrine ROS  Renal/GU negative Renal ROS  negative genitourinary   Musculoskeletal  (+) Fibromyalgia -  Abdominal   Peds  Hematology negative hematology ROS (+)   Anesthesia Other Findings   Reproductive/Obstetrics negative OB ROS                           Anesthesia Physical Anesthesia Plan  ASA: II  Anesthesia Plan: Spinal   Post-op Pain Management:    Induction:   Airway Management Planned:   Additional Equipment:   Intra-op Plan:   Post-operative Plan:   Informed Consent: I have reviewed the patients History and Physical, chart, labs and discussed the procedure including the risks, benefits and alternatives for the proposed anesthesia with the patient or authorized representative who has indicated his/her understanding and acceptance.   Dental Advisory Given  Plan Discussed with: CRNA and Surgeon  Anesthesia Plan Comments:        Anesthesia Quick Evaluation

## 2015-05-06 ENCOUNTER — Inpatient Hospital Stay (HOSPITAL_COMMUNITY): Payer: BLUE CROSS/BLUE SHIELD

## 2015-05-06 ENCOUNTER — Inpatient Hospital Stay (HOSPITAL_COMMUNITY)
Admission: RE | Admit: 2015-05-06 | Discharge: 2015-05-07 | DRG: 470 | Disposition: A | Discharge status: Home Health | Payer: BLUE CROSS/BLUE SHIELD | Source: Ambulatory Visit | Attending: Orthopedic Surgery | Admitting: Orthopedic Surgery

## 2015-05-06 ENCOUNTER — Encounter (HOSPITAL_COMMUNITY): Payer: Self-pay | Admitting: *Deleted

## 2015-05-06 ENCOUNTER — Inpatient Hospital Stay (HOSPITAL_COMMUNITY): Payer: BLUE CROSS/BLUE SHIELD | Admitting: Anesthesiology

## 2015-05-06 ENCOUNTER — Encounter (HOSPITAL_COMMUNITY)
Admission: RE | Disposition: A | Discharge status: Home Health | Payer: Self-pay | Source: Ambulatory Visit | Attending: Orthopedic Surgery

## 2015-05-06 DIAGNOSIS — Z01812 Encounter for preprocedural laboratory examination: Secondary | ICD-10-CM | POA: Diagnosis not present

## 2015-05-06 DIAGNOSIS — Z96649 Presence of unspecified artificial hip joint: Secondary | ICD-10-CM

## 2015-05-06 DIAGNOSIS — Z85828 Personal history of other malignant neoplasm of skin: Secondary | ICD-10-CM | POA: Diagnosis not present

## 2015-05-06 DIAGNOSIS — Z87891 Personal history of nicotine dependence: Secondary | ICD-10-CM | POA: Diagnosis not present

## 2015-05-06 DIAGNOSIS — M797 Fibromyalgia: Secondary | ICD-10-CM | POA: Diagnosis present

## 2015-05-06 DIAGNOSIS — I10 Essential (primary) hypertension: Secondary | ICD-10-CM | POA: Diagnosis present

## 2015-05-06 DIAGNOSIS — Z90710 Acquired absence of both cervix and uterus: Secondary | ICD-10-CM | POA: Diagnosis not present

## 2015-05-06 DIAGNOSIS — M1612 Unilateral primary osteoarthritis, left hip: Secondary | ICD-10-CM | POA: Diagnosis present

## 2015-05-06 HISTORY — PX: TOTAL HIP ARTHROPLASTY: SHX124

## 2015-05-06 LAB — TYPE AND SCREEN
ABO/RH(D): O POS
Antibody Screen: NEGATIVE

## 2015-05-06 SURGERY — ARTHROPLASTY, HIP, TOTAL, ANTERIOR APPROACH
Anesthesia: Spinal | Site: Hip | Laterality: Left

## 2015-05-06 MED ORDER — DIPHENHYDRAMINE HCL 25 MG PO CAPS: 25.0000 mg | ORAL_CAPSULE | Freq: Four times a day (QID) | ORAL | Status: DC | PRN

## 2015-05-06 MED ORDER — ACETAMINOPHEN 325 MG PO TABS: 650.0000 mg | ORAL_TABLET | Freq: Four times a day (QID) | ORAL | Status: DC | PRN

## 2015-05-06 MED ORDER — LACTATED RINGERS IV SOLN
INTRAVENOUS | Status: DC
  Administered 2015-05-06 (×2): via INTRAVENOUS
  Administered 2015-05-06: 1000 mL via INTRAVENOUS

## 2015-05-06 MED ORDER — ONDANSETRON HCL 4 MG/2ML IJ SOLN
INTRAMUSCULAR | Status: DC | PRN
  Administered 2015-05-06: 4 mg via INTRAVENOUS

## 2015-05-06 MED ORDER — CEFAZOLIN SODIUM-DEXTROSE 2-3 GM-% IV SOLR
2.0000 g | INTRAVENOUS | Status: AC
  Administered 2015-05-06: 2 g via INTRAVENOUS

## 2015-05-06 MED ORDER — DOCUSATE SODIUM 100 MG PO CAPS: 100.0000 mg | ORAL_CAPSULE | Freq: Two times a day (BID) | ORAL | Status: DC

## 2015-05-06 MED ORDER — ONDANSETRON HCL 4 MG/2ML IJ SOLN: 4.0000 mg | Freq: Four times a day (QID) | INTRAMUSCULAR | Status: DC | PRN

## 2015-05-06 MED ORDER — ACETAMINOPHEN 650 MG RE SUPP
650.0000 mg | Freq: Four times a day (QID) | RECTAL | Status: DC | PRN
Start: 2015-05-06 — End: 2015-05-07

## 2015-05-06 MED ORDER — PROPOFOL 10 MG/ML IV BOLUS
INTRAVENOUS | Status: AC
  Filled 2015-05-06: qty 20

## 2015-05-06 MED ORDER — DEXAMETHASONE SODIUM PHOSPHATE 10 MG/ML IJ SOLN
10.0000 mg | Freq: Once | INTRAMUSCULAR | Status: AC
  Administered 2015-05-07: 10 mg via INTRAVENOUS
  Filled 2015-05-06: qty 1

## 2015-05-06 MED ORDER — ASPIRIN 325 MG PO TBEC: 325.0000 mg | DELAYED_RELEASE_TABLET | Freq: Two times a day (BID) | ORAL | Status: AC

## 2015-05-06 MED ORDER — POLYETHYLENE GLYCOL 3350 17 G PO PACK: 17.0000 g | PACK | Freq: Two times a day (BID) | ORAL | Status: DC

## 2015-05-06 MED ORDER — FERROUS SULFATE 325 (65 FE) MG PO TABS: 325.0000 mg | ORAL_TABLET | Freq: Three times a day (TID) | ORAL | Status: DC

## 2015-05-06 MED ORDER — POLYETHYLENE GLYCOL 3350 17 G PO PACK
17.0000 g | PACK | Freq: Two times a day (BID) | ORAL | Status: DC
  Administered 2015-05-07: 17 g via ORAL

## 2015-05-06 MED ORDER — MIDAZOLAM HCL 2 MG/2ML IJ SOLN
INTRAMUSCULAR | Status: AC
  Filled 2015-05-06: qty 2

## 2015-05-06 MED ORDER — METHOCARBAMOL 1000 MG/10ML IJ SOLN
500.0000 mg | Freq: Four times a day (QID) | INTRAVENOUS | Status: DC | PRN
  Administered 2015-05-06: 500 mg via INTRAVENOUS
  Filled 2015-05-06 (×2): qty 5

## 2015-05-06 MED ORDER — DEXAMETHASONE SODIUM PHOSPHATE 10 MG/ML IJ SOLN
INTRAMUSCULAR | Status: AC
  Filled 2015-05-06: qty 1

## 2015-05-06 MED ORDER — CHLORHEXIDINE GLUCONATE 4 % EX LIQD
60.0000 mL | Freq: Once | CUTANEOUS | Status: DC
  Administered 2015-05-06: 4 via TOPICAL

## 2015-05-06 MED ORDER — TRAMADOL HCL 50 MG PO TABS: 50.0000 mg | ORAL_TABLET | Freq: Four times a day (QID) | ORAL | Status: DC | PRN

## 2015-05-06 MED ORDER — FERROUS SULFATE 325 (65 FE) MG PO TABS
325.0000 mg | ORAL_TABLET | Freq: Three times a day (TID) | ORAL | Status: DC
  Administered 2015-05-07: 325 mg via ORAL
  Filled 2015-05-06 (×4): qty 1

## 2015-05-06 MED ORDER — ALUM & MAG HYDROXIDE-SIMETH 200-200-20 MG/5ML PO SUSP: 30.0000 mL | ORAL | Status: DC | PRN

## 2015-05-06 MED ORDER — METHOCARBAMOL 500 MG PO TABS: 500.0000 mg | ORAL_TABLET | Freq: Four times a day (QID) | ORAL | Status: DC | PRN

## 2015-05-06 MED ORDER — BISACODYL 10 MG RE SUPP
10.0000 mg | Freq: Every day | RECTAL | Status: DC | PRN
Start: 2015-05-06 — End: 2015-05-07

## 2015-05-06 MED ORDER — CELECOXIB 200 MG PO CAPS
200.0000 mg | ORAL_CAPSULE | Freq: Two times a day (BID) | ORAL | Status: DC
  Administered 2015-05-06 – 2015-05-07 (×2): 200 mg via ORAL
  Filled 2015-05-06 (×3): qty 1

## 2015-05-06 MED ORDER — LACTATED RINGERS IV SOLN
INTRAVENOUS | Status: DC
  Administered 2015-05-06: 11:00:00 via INTRAVENOUS

## 2015-05-06 MED ORDER — AMLODIPINE BESYLATE 10 MG PO TABS
10.0000 mg | ORAL_TABLET | Freq: Every day | ORAL | Status: DC
  Administered 2015-05-06: 10 mg via ORAL
  Filled 2015-05-06 (×2): qty 1

## 2015-05-06 MED ORDER — METOCLOPRAMIDE HCL 5 MG/ML IJ SOLN: 5.0000 mg | Freq: Three times a day (TID) | INTRAMUSCULAR | Status: DC | PRN

## 2015-05-06 MED ORDER — TRANEXAMIC ACID 1000 MG/10ML IV SOLN
1000.0000 mg | INTRAVENOUS | Status: AC
  Administered 2015-05-06: 1000 mg via INTRAVENOUS
  Filled 2015-05-06: qty 10

## 2015-05-06 MED ORDER — CEFAZOLIN SODIUM-DEXTROSE 2-3 GM-% IV SOLR
2.0000 g | Freq: Four times a day (QID) | INTRAVENOUS | Status: AC
  Administered 2015-05-06 (×2): 2 g via INTRAVENOUS
  Filled 2015-05-06 (×2): qty 50

## 2015-05-06 MED ORDER — LORATADINE 10 MG PO TABS
10.0000 mg | ORAL_TABLET | Freq: Every morning | ORAL | Status: DC
  Administered 2015-05-07: 10 mg via ORAL
  Filled 2015-05-06: qty 1

## 2015-05-06 MED ORDER — TRAMADOL HCL 50 MG PO TABS
50.0000 mg | ORAL_TABLET | Freq: Four times a day (QID) | ORAL | Status: DC
  Administered 2015-05-06 – 2015-05-07 (×2): 50 mg via ORAL
  Filled 2015-05-06: qty 2

## 2015-05-06 MED ORDER — ASPIRIN EC 325 MG PO TBEC
325.0000 mg | DELAYED_RELEASE_TABLET | Freq: Two times a day (BID) | ORAL | Status: DC
  Administered 2015-05-07: 325 mg via ORAL
  Filled 2015-05-06 (×3): qty 1

## 2015-05-06 MED ORDER — ONDANSETRON HCL 4 MG PO TABS: 4.0000 mg | ORAL_TABLET | Freq: Four times a day (QID) | ORAL | Status: DC | PRN

## 2015-05-06 MED ORDER — ONDANSETRON HCL 4 MG/2ML IJ SOLN
INTRAMUSCULAR | Status: AC
  Filled 2015-05-06: qty 2

## 2015-05-06 MED ORDER — HYDROMORPHONE HCL 1 MG/ML IJ SOLN: 0.5000 mg | INTRAMUSCULAR | Status: DC | PRN

## 2015-05-06 MED ORDER — SODIUM CHLORIDE 0.9 % IR SOLN
Status: DC | PRN
  Administered 2015-05-06: 1000 mL

## 2015-05-06 MED ORDER — HYDROMORPHONE HCL 1 MG/ML IJ SOLN
0.2500 mg | INTRAMUSCULAR | Status: DC | PRN
  Administered 2015-05-06: 0.25 mg via INTRAVENOUS

## 2015-05-06 MED ORDER — FENTANYL CITRATE (PF) 100 MCG/2ML IJ SOLN
INTRAMUSCULAR | Status: AC
  Filled 2015-05-06: qty 2

## 2015-05-06 MED ORDER — PHENOL 1.4 % MT LIQD: 1.0000 | OROMUCOSAL | Status: DC | PRN

## 2015-05-06 MED ORDER — MENTHOL 3 MG MT LOZG: 1.0000 | LOZENGE | OROMUCOSAL | Status: DC | PRN

## 2015-05-06 MED ORDER — ACETAMINOPHEN 325 MG PO TABS: 650.0000 mg | ORAL_TABLET | Freq: Four times a day (QID) | ORAL | Status: AC | PRN

## 2015-05-06 MED ORDER — ACETAMINOPHEN 10 MG/ML IV SOLN
1000.0000 mg | Freq: Once | INTRAVENOUS | Status: AC
  Administered 2015-05-06: 1000 mg via INTRAVENOUS
  Filled 2015-05-06: qty 100

## 2015-05-06 MED ORDER — SODIUM CHLORIDE 0.9 % IV SOLN
100.0000 mL/h | INTRAVENOUS | Status: DC
  Administered 2015-05-06: 100 mL/h via INTRAVENOUS
  Filled 2015-05-06 (×4): qty 1000

## 2015-05-06 MED ORDER — FENTANYL CITRATE (PF) 100 MCG/2ML IJ SOLN
INTRAMUSCULAR | Status: DC | PRN
  Administered 2015-05-06 (×2): 50 ug via INTRAVENOUS
  Administered 2015-05-06: 100 ug via INTRAVENOUS

## 2015-05-06 MED ORDER — METHOCARBAMOL 500 MG PO TABS
500.0000 mg | ORAL_TABLET | Freq: Four times a day (QID) | ORAL | Status: DC | PRN
  Administered 2015-05-06: 500 mg via ORAL
  Filled 2015-05-06: qty 1

## 2015-05-06 MED ORDER — METOCLOPRAMIDE HCL 10 MG PO TABS: 5.0000 mg | ORAL_TABLET | Freq: Three times a day (TID) | ORAL | Status: DC | PRN

## 2015-05-06 MED ORDER — HYDROMORPHONE HCL 1 MG/ML IJ SOLN
INTRAMUSCULAR | Status: AC
Start: 2015-05-06 — End: 2015-05-06
  Filled 2015-05-06: qty 1

## 2015-05-06 MED ORDER — CEFAZOLIN SODIUM-DEXTROSE 2-3 GM-% IV SOLR
INTRAVENOUS | Status: AC
  Filled 2015-05-06: qty 50

## 2015-05-06 MED ORDER — MAGNESIUM CITRATE PO SOLN: 1.0000 | Freq: Once | ORAL | Status: AC | PRN

## 2015-05-06 MED ORDER — BUPIVACAINE HCL (PF) 0.5 % IJ SOLN
INTRAMUSCULAR | Status: DC | PRN
  Administered 2015-05-06: 15 mg

## 2015-05-06 MED ORDER — MIDAZOLAM HCL 5 MG/5ML IJ SOLN
INTRAMUSCULAR | Status: DC | PRN
  Administered 2015-05-06: 2 mg via INTRAVENOUS

## 2015-05-06 MED ORDER — DOCUSATE SODIUM 100 MG PO CAPS
100.0000 mg | ORAL_CAPSULE | Freq: Two times a day (BID) | ORAL | Status: DC
  Administered 2015-05-07: 100 mg via ORAL

## 2015-05-06 MED ORDER — BUPIVACAINE HCL (PF) 0.5 % IJ SOLN
INTRAMUSCULAR | Status: AC
  Filled 2015-05-06: qty 30

## 2015-05-06 MED ORDER — DEXAMETHASONE SODIUM PHOSPHATE 10 MG/ML IJ SOLN
INTRAMUSCULAR | Status: DC | PRN
  Administered 2015-05-06: 10 mg via INTRAVENOUS

## 2015-05-06 MED ORDER — PROPOFOL INFUSION 10 MG/ML OPTIME
INTRAVENOUS | Status: DC | PRN
  Administered 2015-05-06: 70 ug/kg/min via INTRAVENOUS

## 2015-05-06 MED ORDER — SUMATRIPTAN SUCCINATE 100 MG PO TABS
100.0000 mg | ORAL_TABLET | ORAL | Status: DC | PRN
  Filled 2015-05-06: qty 1

## 2015-05-06 SURGICAL SUPPLY — 42 items
BAG DECANTER FOR FLEXI CONT (MISCELLANEOUS) IMPLANT
BAG ZIPLOCK 12X15 (MISCELLANEOUS) IMPLANT
CAPT HIP TOTAL 2 ×2 IMPLANT
COVER PERINEAL POST (MISCELLANEOUS) ×2 IMPLANT
DRAPE C-ARM 42X120 X-RAY (DRAPES) ×2 IMPLANT
DRAPE STERI IOBAN 125X83 (DRAPES) ×2 IMPLANT
DRAPE U-SHAPE 47X51 STRL (DRAPES) ×6 IMPLANT
DRSG AQUACEL AG ADV 3.5X10 (GAUZE/BANDAGES/DRESSINGS) ×2 IMPLANT
DURAPREP 26ML APPLICATOR (WOUND CARE) ×2 IMPLANT
ELECT BLADE TIP CTD 4 INCH (ELECTRODE) ×2 IMPLANT
ELECT PENCIL ROCKER SW 15FT (MISCELLANEOUS) ×2 IMPLANT
ELECT REM PT RETURN 15FT ADLT (MISCELLANEOUS) IMPLANT
ELECT REM PT RETURN 9FT ADLT (ELECTROSURGICAL) ×2
ELECTRODE REM PT RTRN 9FT ADLT (ELECTROSURGICAL) ×1 IMPLANT
FACESHIELD WRAPAROUND (MASK) ×8 IMPLANT
GLOVE BIOGEL PI IND STRL 7.5 (GLOVE) ×1 IMPLANT
GLOVE BIOGEL PI IND STRL 8.5 (GLOVE) ×1 IMPLANT
GLOVE BIOGEL PI INDICATOR 7.5 (GLOVE) ×1
GLOVE BIOGEL PI INDICATOR 8.5 (GLOVE) ×1
GLOVE ECLIPSE 8.0 STRL XLNG CF (GLOVE) ×4 IMPLANT
GLOVE ORTHO TXT STRL SZ7.5 (GLOVE) ×2 IMPLANT
GOWN SPEC L3 XXLG W/TWL (GOWN DISPOSABLE) ×2 IMPLANT
GOWN STRL REUS W/TWL LRG LVL3 (GOWN DISPOSABLE) ×2 IMPLANT
HOLDER FOLEY CATH W/STRAP (MISCELLANEOUS) ×2 IMPLANT
KIT BASIN OR (CUSTOM PROCEDURE TRAY) ×2 IMPLANT
LIQUID BAND (GAUZE/BANDAGES/DRESSINGS) ×2 IMPLANT
NDL SAFETY ECLIPSE 18X1.5 (NEEDLE) IMPLANT
NEEDLE HYPO 18GX1.5 SHARP (NEEDLE)
PACK TOTAL JOINT (CUSTOM PROCEDURE TRAY) ×2 IMPLANT
PEN SKIN MARKING BROAD (MISCELLANEOUS) ×2 IMPLANT
SAW OSC TIP CART 19.5X105X1.3 (SAW) ×2 IMPLANT
SUT MNCRL AB 4-0 PS2 18 (SUTURE) ×2 IMPLANT
SUT VIC AB 1 CT1 36 (SUTURE) ×6 IMPLANT
SUT VIC AB 2-0 CT1 27 (SUTURE) ×2
SUT VIC AB 2-0 CT1 TAPERPNT 27 (SUTURE) ×2 IMPLANT
SUT VLOC 180 0 24IN GS25 (SUTURE) ×2 IMPLANT
SYR 50ML LL SCALE MARK (SYRINGE) IMPLANT
TOWEL OR 17X26 10 PK STRL BLUE (TOWEL DISPOSABLE) ×2 IMPLANT
TOWEL OR NON WOVEN STRL DISP B (DISPOSABLE) IMPLANT
TRAY FOLEY W/METER SILVER 14FR (SET/KITS/TRAYS/PACK) ×2 IMPLANT
WATER STERILE IRR 1500ML POUR (IV SOLUTION) ×2 IMPLANT
YANKAUER SUCT BULB TIP 10FT TU (MISCELLANEOUS) ×2 IMPLANT

## 2015-05-06 NOTE — Plan of Care (Signed)
Problem: Phase I Progression Outcomes Goal: Initial discharge plan identified Outcome: Completed/Met Date Met:  05/06/15 Pt plans to go home with the help of her friends.

## 2015-05-06 NOTE — Discharge Instructions (Signed)

## 2015-05-06 NOTE — Transfer of Care (Signed)
Immediate Anesthesia Transfer of Care Note  Patient: Carol King  Procedure(s) Performed: Procedure(s): LEFT TOTAL HIP ARTHROPLASTY ANTERIOR APPROACH (Left)  Patient Location: PACU  Anesthesia Type:Spinal  Level of Consciousness: sedated  Airway & Oxygen Therapy: Patient Spontanous Breathing and Patient connected to face mask oxygen  Post-op Assessment: Report given to RN and Post -op Vital signs reviewed and stable  Post vital signs: Reviewed and stable  Last Vitals:  Filed Vitals:   05/06/15 0546  BP: 118/51  Pulse: 80  Temp: 36.4 C  Resp: 16    Complications: No apparent anesthesia complications

## 2015-05-06 NOTE — Interval H&P Note (Signed)
History and Physical Interval Note:  05/06/2015 6:51 AM  Carol King  has presented today for surgery, with the diagnosis of LEFT HIP OA  The various methods of treatment have been discussed with the patient and family. After consideration of risks, benefits and other options for treatment, the patient has consented to  Procedure(s): LEFT TOTAL HIP ARTHROPLASTY ANTERIOR APPROACH (Left) as a surgical intervention .  The patient's history has been reviewed, patient examined, no change in status, stable for surgery.  I have reviewed the patient's chart and labs.  Questions were answered to the patient's satisfaction.     Mauri Pole

## 2015-05-06 NOTE — Anesthesia Procedure Notes (Signed)
Spinal Patient location during procedure: OR Start time: 05/06/2015 8:25 AM End time: 05/06/2015 8:27 AM Staffing Resident/CRNA: Harle Stanford R Performed by: resident/CRNA  Preanesthetic Checklist Completed: patient identified, site marked, surgical consent, pre-op evaluation, timeout performed, IV checked, risks and benefits discussed and monitors and equipment checked Spinal Block Patient position: sitting Prep: Betadine Patient monitoring: heart rate Approach: midline Location: L3-4 Injection technique: single-shot Needle Needle type: Sprotte  Needle gauge: 24 G Needle length: 9 cm Needle insertion depth: 6 cm Assessment Sensory level: T6

## 2015-05-06 NOTE — Anesthesia Postprocedure Evaluation (Signed)
  Anesthesia Post-op Note  Patient: Carol King  Procedure(s) Performed: Procedure(s) (LRB): LEFT TOTAL HIP ARTHROPLASTY ANTERIOR APPROACH (Left)  Patient Location: PACU  Anesthesia Type: Spinal  Level of Consciousness: awake and alert   Airway and Oxygen Therapy: Patient Spontanous Breathing  Post-op Pain: mild  Post-op Assessment: Post-op Vital signs reviewed, Patient's Cardiovascular Status Stable, Respiratory Function Stable, Patent Airway and No signs of Nausea or vomiting  Last Vitals:  Filed Vitals:   05/06/15 1225  BP: 114/64  Pulse: 73  Temp: 36.6 C  Resp: 16    Post-op Vital Signs: stable   Complications: No apparent anesthesia complications

## 2015-05-06 NOTE — Op Note (Signed)
NAME:  Carol King                ACCOUNT NO.: [id number]      MEDICAL RECORD NO.: [id number]      FACILITY:  [facility]      PHYSICIAN:  Paralee Cancel D  DATE OF BIRTH:  1951-07-18     DATE OF PROCEDURE:  05/06/2015                                 OPERATIVE REPORT         PREOPERATIVE DIAGNOSIS: Left  hip osteoarthritis.      POSTOPERATIVE DIAGNOSIS:  Left hip osteoarthritis.      PROCEDURE:  Left total hip replacement through an anterior approach   utilizing DePuy THR system, component size 6mm pinnacle cup, a size 32+4 neutral   Altrex liner, a size 3 standard Tri Lock stem with a 32+1 delta ceramic   ball.      SURGEON:  Pietro Cassis. Alvan Dame, M.D.      ASSISTANT:  Nehemiah Massed, PA-C     ANESTHESIA:  Spinal.      SPECIMENS:  None.      COMPLICATIONS:  None.      BLOOD LOSS:  300 cc     DRAINS:  None.      INDICATION OF THE PROCEDURE:  Carol King is a 64 y.o. female who had   presented to office for evaluation of left hip pain.  Radiographs revealed   progressive degenerative changes with bone-on-bone   articulation to the  hip joint.  The patient had painful limited range of   motion significantly affecting their overall quality of life.  The patient was failing to    respond to conservative measures, and at this point was ready   to proceed with more definitive measures.  The patient has noted progressive   degenerative changes in his hip, progressive problems and dysfunction   with regarding the hip prior to surgery.  Consent was obtained for   benefit of pain relief.  Specific risk of infection, DVT, component   failure, dislocation, need for revision surgery, as well discussion of   the anterior versus posterior approach were reviewed.  Consent was   obtained for benefit of anterior pain relief through an anterior   approach.      PROCEDURE IN DETAIL:  The patient was brought to operative theater.   Once adequate anesthesia,  preoperative antibiotics, 2gm of Ancef, 1gm of Tranexamic Acid, and 10mg  of Decadron administered.   The patient was positioned supine on the OSI [place] table.  Once adequate   padding of boney process was carried out, we had predraped out the hip, and  used fluoroscopy to confirm orientation of the pelvis and position.      The left hip was then prepped and draped from proximal iliac crest to   mid thigh with shower curtain technique.      Time-out was performed identifying the patient, planned procedure, and   extremity.     An incision was then made 2 cm distal and lateral to the   anterior superior iliac spine extending over the orientation of the   tensor fascia lata muscle and sharp dissection was carried down to the   fascia of the muscle and protractor placed in the soft tissues.      The fascia was then incised.  The  muscle belly was identified and swept   laterally and retractor placed along the superior neck.  Following   cauterization of the circumflex vessels and removing some pericapsular   fat, a second cobra retractor was placed on the inferior neck.  A third   retractor was placed on the anterior acetabulum after elevating the   anterior rectus.  A L-capsulotomy was along the line of the   superior neck to the trochanteric fossa, then extended proximally and   distally.  Tag sutures were placed and the retractors were then placed   intracapsular.  We then identified the trochanteric fossa and   orientation of my neck cut, confirmed this radiographically   and then made a neck osteotomy with the femur on traction.  The femoral   head was removed without difficulty or complication.  Traction was let   off and retractors were placed posterior and anterior around the   acetabulum.      The labrum and foveal tissue were debrided.  I began reaming with a 36mm   reamer and reamed up to 87mm reamer with good bony bed preparation and a 50mm   cup was chosen.  The final 42mm  Pinnacle cup was then impacted under fluoroscopy  to confirm the depth of penetration and orientation with respect to   abduction.  A screw was placed followed by the hole eliminator.  The final   32+4 neutral Altrex liner was impacted with good visualized rim fit.  The cup was positioned anatomically within the acetabular portion of the pelvis.      At this point, the femur was rolled at 80 degrees.  Further capsule was   released off the inferior aspect of the femoral neck.  I then   released the superior capsule proximally.  The hook was placed laterally   along the femur and elevated manually and held in position with the bed   hook.  The leg was then extended and adducted with the leg rolled to 100   degrees of external rotation.  Once the proximal femur was fully   exposed, I used a box osteotome to set orientation.  I then began   broaching with the starting chili pepper broach and passed this by hand and then broached up to 3 to match the other hip previously replaced.  With the 3 broach in place I chose a standard offset neck and did a trial reduction.  The offset was appropriate, leg lengths   appeared to be equal, confirmed radiographically.   Given these findings, I went ahead and dislocated the hip, repositioned all   retractors and positioned the right hip in the extended and abducted position.  The final 3 Standard  Tri Lock stem was   chosen and it was impacted down to the level of neck cut.  Based on this   and the trial reduction, a 32+1 delta ceramic ball was chosen and   impacted onto a clean and dry trunnion, and the hip was reduced.  The   hip had been irrigated throughout the case again at this point.  I did   reapproximate the superior capsular leaflet to the anterior leaflet   using #1 Vicryl.  The fascia of the   tensor fascia lata muscle was then reapproximated using #1 Vicryl and #0 V-lock sutures.  The   remaining wound was closed with 2-0 Vicryl and running 4-0  Monocryl.   The hip was cleaned, dried, and dressed sterilely using  Dermabond and   Aquacel dressing.  She was then brought   to recovery room in stable condition tolerating the procedure well.    Nehemiah Massed, PA-C was present for the entirety of the case involved from   preoperative positioning, perioperative retractor management, general   facilitation of the case, as well as primary wound closure as assistant.            Pietro Cassis Alvan Dame, M.D.        05/06/2015 9:55 AM

## 2015-05-07 LAB — BASIC METABOLIC PANEL
Anion gap: 7 (ref 5–15)
BUN: 15 mg/dL (ref 6–20)
CO2: 25 mmol/L (ref 22–32)
CREATININE: 0.51 mg/dL (ref 0.44–1.00)
Calcium: 8.9 mg/dL (ref 8.9–10.3)
Chloride: 106 mmol/L (ref 101–111)
GFR calc Af Amer: >60 mL/min (ref >60)
GFR calc non Af Amer: >60 mL/min (ref >60)
GLUCOSE: 127 mg/dL — AB (ref 65–99)
Potassium: 4 mmol/L (ref 3.5–5.1)
Sodium: 138 mmol/L (ref 135–145)

## 2015-05-07 LAB — CBC
HEMATOCRIT: 31.8 % — AB (ref 36.0–46.0)
Hemoglobin: 10.8 g/dL — ABNORMAL LOW (ref 12.0–15.0)
MCH: 29.7 pg (ref 26.0–34.0)
MCHC: 34 g/dL (ref 30.0–36.0)
MCV: 87.4 fL (ref 78.0–100.0)
PLATELETS: 226 10*3/uL (ref 150–400)
RBC: 3.64 MIL/uL — ABNORMAL LOW (ref 3.87–5.11)
RDW: 12.7 % (ref 11.5–15.5)
WBC: 9.5 10*3/uL (ref 4.0–10.5)

## 2015-05-07 NOTE — Discharge Summary (Signed)
Physician Discharge Summary  Patient ID: Carol King MRN: [id number] DOB/AGE: 1951/06/11 64 y.o.  Admit date: 05/06/2015 Discharge date: 05/07/2015   Procedures:  Procedure(s) (LRB): LEFT TOTAL HIP ARTHROPLASTY ANTERIOR APPROACH (Left)  Attending Physician:  Dr. Paralee Cancel   Admission Diagnoses:   Left hip primary OA / pain  Discharge Diagnoses:  Principal Problem:   S/P left THA, AA  Past Medical History  Diagnosis Date  . Skin cancer     BCC, SCC  . Complication of anesthesia   . PONV (postoperative nausea and vomiting)     SEVERAL YRS AGO - NONE WITH MORE RECENT SURGERIES  . Hypertension   . Headache     MIGRAINES  . Arthritis   . History of skin cancer   . Difficulty sleeping     DUE TO HIP PAIN    HPI:    Carol King, 64 y.o. female, has a history of pain and functional disability in the left hip(s) due to arthritis and patient has failed non-surgical conservative treatments for greater than 12 weeks to include NSAID's and/or analgesics, corticosteriod injections, use of assistive devices and activity modification. Onset of symptoms was gradual starting 2 years ago with gradually worsening course since that time.The patient noted no past surgery on the left hip(s). Patient currently rates pain in the left hip at 6 out of 10 with activity. Patient has night pain, worsening of pain with activity and weight bearing, trendelenberg gait, pain that interfers with activities of daily living, pain with passive range of motion and crepitus. Patient has evidence of periarticular osteophytes and joint space narrowing by imaging studies. This condition presents safety issues increasing the risk of falls. There is no current active infection.  PCP: Carol Blinks, MD   Discharged Condition: good  Hospital Course:  Patient underwent the above stated procedure on 05/06/2015. Patient tolerated the procedure well and brought to the recovery room in good condition and  subsequently to the floor.  POD #1 BP: 122/58 ; Pulse: 80 ; Temp: 98.4 F (36.9 C) ; Resp: 16 Patient reports pain as mild, pain controlled. No events throughout the night. Ready to be discharged home.  Dorsiflexion/plantar flexion intact, incision: dressing C/D/I, no cellulitis present and compartment soft.   LABS  Basename    HGB  10.8  HCT  31.8    Discharge Exam: General appearance: alert, cooperative and no distress Extremities: Homans sign is negative, no sign of DVT, no edema, redness or tenderness in the calves or thighs and no ulcers, gangrene or trophic changes  Disposition: Home with follow up in 2 weeks   Follow-up Information    Follow up with Mauri Pole, MD. Schedule an appointment as soon as possible for a visit in 2 weeks.   Specialty:  Orthopedic Surgery   Contact information:   [address] [place] [address] [phone]       Follow up with [facility].   Why:  home health physical therapy   Contact information:   [address] [place] [address] [phone]       Discharge Instructions    Call MD / Call 911    Complete by:  As directed   If you experience chest pain or shortness of breath, CALL 911 and be transported to the hospital emergency room.  If you develope a fever above 101 F, pus (white drainage) or increased drainage or redness at the wound, or calf pain, call your surgeon's office.  Change dressing    Complete by:  As directed   Maintain surgical dressing until follow up in the clinic. If the edges start to pull up, may reinforce with tape. If the dressing is no longer working, may remove and cover with gauze and tape, but must keep the area dry and clean.  Call with any questions or concerns.     Constipation Prevention    Complete by:  As directed   Drink plenty of fluids.  Prune juice may be helpful.  You may use a stool softener, such as Colace (over the counter) 100 mg twice a day.   Use MiraLax (over the counter) for constipation as needed.     Diet - low sodium heart healthy    Complete by:  As directed      Discharge instructions    Complete by:  As directed   Maintain surgical dressing until follow up in the clinic. If the edges start to pull up, may reinforce with tape. If the dressing is no longer working, may remove and cover with gauze and tape, but must keep the area dry and clean.  Follow up in 2 weeks at [facility]. Call with any questions or concerns.     Increase activity slowly as tolerated    Complete by:  As directed      TED hose    Complete by:  As directed   Use stockings (TED hose) for 2 weeks on both leg(s).  You may remove them at night for sleeping.     Weight bearing as tolerated    Complete by:  As directed   Laterality:  left  Extremity:  Lower             Medication List    STOP taking these medications        aspirin-acetaminophen-caffeine 250-250-65 MG per tablet  Commonly known as:  EXCEDRIN MIGRAINE     ibuprofen 200 MG tablet  Commonly known as:  ADVIL,MOTRIN      TAKE these medications        acetaminophen 325 MG tablet  Commonly known as:  TYLENOL  Take 2 tablets (650 mg total) by mouth every 6 (six) hours as needed for mild pain (or Fever >/= 101).     amLODipine 10 MG tablet  Commonly known as:  NORVASC  Take 1 tablet (10 mg total) by mouth daily.     aspirin 325 MG EC tablet  Take 1 tablet (325 mg total) by mouth 2 (two) times daily.     CALCIUM PO  Take 2 tablets by mouth every morning.     COSAMIN DS PO  Take 1 tablet by mouth every morning.     docusate sodium 100 MG capsule  Commonly known as:  COLACE  Take 1 capsule (100 mg total) by mouth 2 (two) times daily.     ferrous sulfate 325 (65 FE) MG tablet  Take 1 tablet (325 mg total) by mouth 3 (three) times daily after meals.     LIPO-FLAVONOID PLUS Tabs  Take 2 tablets by mouth 2 (two) times daily.     loratadine 10 MG tablet    Commonly known as:  CLARITIN  Take 10 mg by mouth every morning.     Magnesium 250 MG Tabs  Take 500 mg by mouth every morning. [employer].     methocarbamol 500 MG tablet  Commonly known as:  ROBAXIN  Take 1 tablet (500 mg total) by mouth  every 6 (six) hours as needed for muscle spasms.     multivitamin with minerals Tabs tablet  Take 2 tablets by mouth every morning.     OVER THE COUNTER MEDICATION  Take 1 tablet by mouth every morning. Adaptamax--Nature Sunshine.     OVER THE COUNTER MEDICATION  1 tablet every morning. Anxious Leg- Emiliano Dyer.     OVER THE COUNTER MEDICATION  Take 1 tablet by mouth every morning. Stress [employer].     OVER THE COUNTER MEDICATION  Take 1 tablet by mouth every morning. [place].     OVER THE COUNTER MEDICATION  Take 1 tablet by mouth every morning. Mood Elevator- [employer].     OVER THE COUNTER MEDICATION  Take 1 tablet by mouth 2 (two) times daily. [place].     OVER THE COUNTER MEDICATION  Take 1 tablet by mouth 2 (two) times daily. Enviro Detox- [employer].     OVER THE COUNTER MEDICATION  Take 2 tablets by mouth at bedtime.     OVER THE COUNTER MEDICATION  Take 2 tablets by mouth every morning. [place].     OVER THE COUNTER MEDICATION  Take 2 tablets by mouth every morning. Skeletal Strength- Natural Sunshine.     OVER THE COUNTER MEDICATION  Take 1 tablet by mouth every morning. ALJEmiliano Dyer.     OVER THE COUNTER MEDICATION  Take 1 tablet by mouth every morning. Sinus Support     OVER THE COUNTER MEDICATION  Take 1 tablet by mouth every morning. Ever Flex- [employer].     OVER THE COUNTER MEDICATION  Take 1 tablet by mouth every morning. Fibralgia     OVER THE COUNTER MEDICATION  Take 1 tablet by mouth every morning. Liver Cleanse--Nature Sunshine.     polyethylene glycol packet  Commonly known as:   MIRALAX / GLYCOLAX  Take 17 g by mouth 2 (two) times daily.     SUMAtriptan 100 MG tablet  Commonly known as:  IMITREX  Take 1 as needed for migraineMay repeat in 2 hours if headache persists or recurs.     traMADol 50 MG tablet  Commonly known as:  ULTRAM  Take 1-2 tablets (50-100 mg total) by mouth every 6 (six) hours as needed.     TURMERIC PO  Take 1 tablet by mouth every morning.     VITAMIN A & D PO  Take 1 tablet by mouth every morning. Nature sunshine     VITAMIN C PO  Take 1 tablet by mouth every morning. Time Release - Emiliano Dyer.         Signed: [place]. Anastasiya Gowin   PA-C  05/07/2015, 8:04 PM

## 2015-05-07 NOTE — Progress Notes (Signed)
Physical Therapy Treatment Patient Details Name: Carol King MRN: [id number] DOB: [birth year] Today's Date: 05/07/2015    History of Present Illness s/p L DA THA    PT Comments    Progressing well and eager for dc home this date.  Follow Up Recommendations  Home health PT     Equipment Recommendations  None recommended by PT    Recommendations for Other Services OT consult     Precautions / Restrictions Precautions Precautions: Fall Restrictions Weight Bearing Restrictions: No    Mobility  Bed Mobility Overal bed mobility: Needs Assistance Bed Mobility: Supine to Sit     Supine to sit: Supervision     General bed mobility comments: in chair  Transfers Overall transfer level: Needs assistance Equipment used: Rolling walker (2 wheeled) Transfers: Sit to/from Stand Sit to Stand: Supervision         General transfer comment: cues for UE placement  Ambulation/Gait Ambulation/Gait assistance: Min guard;Supervision Ambulation Distance (Feet): 300 Feet Assistive device: Rolling walker (2 wheeled);Straight cane Gait Pattern/deviations: Step-to pattern;Step-through pattern;Decreased step length - right;Decreased step length - left;Shuffle;Trunk flexed     General Gait Details: min cues for posture, position from RW and ER on L.  Pt ambulated 100' with RW and Sup and 200' with cane and min guard   Stairs Stairs: Yes Stairs assistance: Min assist Stair Management: No rails;Step to pattern;Forwards;With cane Number of Stairs: 5 General stair comments: min cues for sequence and foot/cane placement  Wheelchair Mobility    Modified Rankin (Stroke Patients Only)       Balance                                    Cognition Arousal/Alertness: Awake/alert Behavior During Therapy: WFL for tasks assessed/performed Overall Cognitive Status: Within Functional Limits for tasks assessed                      Exercises Total Joint  Exercises Ankle Circles/Pumps: AROM;Both;15 reps;Supine Quad Sets: AROM;Both;10 reps;Supine Heel Slides: AAROM;Left;20 reps;Supine Hip ABduction/ADduction: AAROM;Left;15 reps;Supine    General Comments        Pertinent Vitals/Pain Pain Assessment: 0-10 Pain Score: 1  Pain Location: L hip Pain Descriptors / Indicators: Sore Pain Intervention(s): Limited activity within patient's tolerance;Monitored during session;Premedicated before session;Repositioned;Ice applied    Home Living Family/patient expects to be discharged to:: Private residence Living Arrangements: Alone Available Help at Discharge: Friend(s);Available PRN/intermittently           Additional Comments: has a chair she can put in tub    Prior Function Level of Independence: Independent          PT Goals (current goals can now be found in the care plan section) Acute Rehab PT Goals Patient Stated Goal: heal Progress towards PT goals: Progressing toward goals    Frequency  7X/week    PT Plan Current plan remains appropriate    Co-evaluation             End of Session Equipment Utilized During Treatment: Gait belt Activity Tolerance: Patient tolerated treatment well Patient left: in chair;with call bell/phone within reach     Time: 9242-6834 PT Time Calculation (min) (ACUTE ONLY): 15 min  Charges:  $Gait Training: 8-22 mins $Therapeutic Exercise: 8-22 mins $Therapeutic Activity: 8-22 mins                    G Codes:  Carol King 05/07/2015, 12:10 PM

## 2015-05-07 NOTE — Plan of Care (Signed)
Problem: Consults Goal: Diagnosis- Total Joint Replacement Outcome: Completed/Met Date Met:  05/07/15 Primary Total Hip LEFT, Anterior  Problem: Phase III Progression Outcomes Goal: Anticoagulant follow-up in place Outcome: Not Applicable Date Met:  10/71/25 ASA VTE, no f/u needed.

## 2015-05-07 NOTE — Evaluation (Signed)
Physical Therapy Evaluation Patient Details Name: Carol King MRN: [id number] DOB: 1951/11/05 Today's Date: 05/06/2015  (late entry due to error in note entry on day of evaluation)    History of Present Illness  s/p L DA THA  Clinical Impression  Pt s/p LDATHA presents with decreased mobility to benefit from PT to increase safety and ability to transition home alone with PRN assist   Follow Up Recommendations Home health PT    Equipment Recommendations  Rolling walker with 5" wheels (may not require depending on how she progreses tomorrow and can borrow one from the church if needed. )    Recommendations for Other Services       Precautions / Restrictions Precautions Precautions: None Restrictions Weight Bearing Restrictions: No      Mobility  Bed Mobility Overal bed mobility: Needs Assistance Bed Mobility: Supine to Sit     Supine to sit: Min assist     General bed mobility comments: With HOB elevated and use of rails . But very little asssitance needed for upper and LE   Transfers Overall transfer level: Needs assistance Equipment used: Rolling walker (2 wheeled) Transfers: Sit to/from Stand Sit to Stand: Supervision;Min guard         General transfer comment: cues for UE placement and RW safety   Ambulation/Gait Ambulation/Gait assistance: Min assist Ambulation Distance (Feet): 50 Feet Assistive device: Rolling walker (2 wheeled) Gait Pattern/deviations: Step-to pattern     General Gait Details: cues for RW sequencing.   Stairs            Wheelchair Mobility    Modified Rankin (Stroke Patients Only)       Balance Overall balance assessment: No apparent balance deficits (not formally assessed)                                           Pertinent Vitals/Pain Pain Assessment: 0-10 Pain Score: 1  Pain Location: L hip, very little pain, just can tell she had surgery  Pain Descriptors / Indicators: Aching Pain  Intervention(s): Monitored during session;Ice applied    Home Living Family/patient expects to be discharged to:: Private residence Living Arrangements: Alone Available Help at Discharge: Friend(s);Available PRN/intermittently Type of Home: House Home Access: Stairs to enter Entrance Stairs-Rails: Can reach both Entrance Stairs-Number of Steps: 4-5 Home Layout: One level Home Equipment: Cane - single point Additional Comments: has a chair she can put in tub, she will also borrow a RW from church if she needs it.     Prior Function Level of Independence: Independent         Comments: was having difficulties going up and down steps doing step to pattern to decrease the pain     Hand Dominance        Extremity/Trunk Assessment               Lower Extremity Assessment: LLE deficits/detail   LLE Deficits / Details: grossly 3+/5 for L LE      Communication   Communication: No difficulties  Cognition Arousal/Alertness: Awake/alert Behavior During Therapy: WFL for tasks assessed/performed Overall Cognitive Status: Within Functional Limits for tasks assessed                      General Comments      Exercises Total Joint Exercises Ankle Circles/Pumps: AROM;Left;10 reps;Supine Quad Sets: AROM;Left;Supine;10 reps Heel Slides:  AAROM;Supine;Left;5 reps Hip ABduction/ADduction: AAROM;Supine;Left;5 reps Straight Leg Raises: AAROM;Supine;Left;5 reps      Assessment/Plan    PT Assessment Patient needs continued PT services  PT Diagnosis Difficulty walking   PT Problem List Decreased strength;Decreased activity tolerance;Decreased balance;Decreased mobility;Decreased knowledge of use of DME  PT Treatment Interventions DME instruction;Gait training;Stair training;Functional mobility training;Therapeutic activities;Therapeutic exercise;Patient/family education   PT Goals (Current goals can be found in the Care Plan section) Acute Rehab PT Goals Patient Stated  Goal: to go home using a cae if she can  PT Goal Formulation: With patient Time For Goal Achievement: 05/21/15 Potential to Achieve Goals: Good    Frequency 7X/week   Barriers to discharge                       End of Session Equipment Utilized During Treatment: Gait belt Activity Tolerance: Patient tolerated treatment well Patient left: in chair Nurse Communication: Mobility status         Time:  -   14:30-14:50     Charges: 1 visit , 1 EV charge               Clide Dales 05/07/2015, 3:12 PM    (late entry due to error with note entry on day of eval)   Clide Dales, PT Pager: [phone] 05/07/2015

## 2015-05-07 NOTE — Care Management Note (Signed)
Case Management Note  Patient Details  Name: Carol King MRN: [id number] Date of Birth: 1951-12-10  Subjective/Objective:                  LEFT TOTAL HIP ARTHROPLASTY ANTERIOR APPROACH (Left)  Action/Plan:  Discharge planning Expected Discharge Date:  05/07/15               Expected Discharge Plan:  [place]  In-House Referral:     Discharge planning Services  CM Consult  Post Acute Care Choice:  Home Health Choice offered to:  Patient  DME Arranged:    DME Agency:     HH Arranged:  PT HH Agency:  [place]  Status of Service:  Completed, signed off  Medicare Important Message Given:    Date Medicare IM Given:    Medicare IM give by:    Date Additional Medicare IM Given:    Additional Medicare Important Message give by:     If discussed at [place] of Stay Meetings, dates discussed:    Additional Comments: 10:10 Cm met with pt in room to offer choice ofhome health agency.  Pt chooses [facility] to render HHPT.  No DME is needed.  Address and contact information verified by pt.  Referral emailed to [facility] rep, Tim.  No other CM needs were communicated.   Dellie Catholic, RN 05/07/2015, 10:13 AM

## 2015-05-07 NOTE — Evaluation (Signed)
Occupational Therapy Evaluation Patient Details Name: Carol King MRN: [id number] DOB: Oct 05, 1951 Today's Date: 05/07/2015    History of Present Illness s/p L DA THA   Clinical Impression   This 64 year old female was admitted for the above surgery.  All education was completed.  No further OT is needed at this time    Follow Up Recommendations  No OT follow up    Equipment Recommendations  None recommended by OT    Recommendations for Other Services       Precautions / Restrictions Precautions Precautions: Fall Restrictions Weight Bearing Restrictions: No      Mobility Bed Mobility               General bed mobility comments: in chair  Transfers Overall transfer level: Needs assistance Equipment used: Rolling walker (2 wheeled) Transfers: Sit to/from Stand Sit to Stand: Supervision         General transfer comment: cues for UE placement    Balance                                            ADL Overall ADL's : Needs assistance/impaired                         Toilet Transfer: Supervision/safety;Ambulation;Comfort height toilet   Toileting- Clothing Manipulation and Hygiene: Supervision/safety;Sit to/from stand         General ADL Comments: pt needs min A for LB adls and set up for UB adls or supervision to retrieve items.  Pt had other hip done 4 years ago and doesn't feel she will need 3:1 commode.  Reviewed safety, tub transfer, AE, if needed.  Pt can call friends at any time and will have someone stay with her tonight. She demonstrates good Arts development officer     Praxis      Pertinent Vitals/Pain Pain Assessment: 0-10 Pain Score: 1  Pain Location: L hip Pain Descriptors / Indicators: Sore Pain Intervention(s): Limited activity within patient's tolerance;Monitored during session;Premedicated before session;Repositioned;Ice applied     Hand Dominance     Extremity/Trunk Assessment  Upper Extremity Assessment Upper Extremity Assessment: Overall WFL for tasks assessed           Communication Communication Communication: No difficulties   Cognition Arousal/Alertness: Awake/alert Behavior During Therapy: WFL for tasks assessed/performed Overall Cognitive Status: Within Functional Limits for tasks assessed                     General Comments       Exercises       Shoulder Instructions      Home Living Family/patient expects to be discharged to:: Private residence Living Arrangements: Alone Available Help at Discharge: Friend(s);Available PRN/intermittently               Bathroom Shower/Tub: Tub/shower unit Shower/tub characteristics: Architectural technologist: Standard         Additional Comments: has a chair she can put in tub      Prior Functioning/Environment Level of Independence: Independent             OT Diagnosis: Generalized weakness   OT Problem List:     OT Treatment/Interventions:      OT Goals(Current goals can be found in the care plan section) Acute  Rehab OT Goals Patient Stated Goal: heal  OT Frequency:     Barriers to D/C:            Co-evaluation              End of Session    Activity Tolerance: Patient tolerated treatment well;No increased pain Patient left: in chair;with call bell/phone within reach   Time: 0923-0941 OT Time Calculation (min): 18 min Charges:  OT General Charges $OT Visit: 1 Procedure OT Evaluation $Initial OT Evaluation Tier I: 1 Procedure G-Codes:    Aalijah Mims 05-21-2015, 10:18 AM  Lesle Chris, OTR/L [phone] 21-May-2015

## 2015-05-07 NOTE — Progress Notes (Signed)
     Subjective: 1 Day Post-Op Procedure(s) (LRB): LEFT TOTAL HIP ARTHROPLASTY ANTERIOR APPROACH (Left)   Patient reports pain as mild, pain controlled. No events throughout the night. Ready to be discharged home.   Objective:   VITALS:   Filed Vitals:   05/07/15 0625  BP: 122/58  Pulse: 80  Temp: 98.4 F (36.9 C)  Resp: 16    Dorsiflexion/Plantar flexion intact Incision: dressing C/D/I No cellulitis present Compartment soft  LABS  Recent Labs  05/07/15 0410  HGB 10.8*  HCT 31.8*  WBC 9.5  PLT 226     Recent Labs  05/07/15 0410  NA 138  K 4.0  BUN 15  CREATININE 0.51  GLUCOSE 127*     Assessment/Plan: 1 Day Post-Op Procedure(s) (LRB): LEFT TOTAL HIP ARTHROPLASTY ANTERIOR APPROACH (Left) Foley cath d/c'ed Advance diet Up with therapy D/C IV fluids Discharge home with home health  Follow up in 2 weeks at [facility]. Follow up with OLIN,Jebidiah Baggerly D in 2 weeks.  Contact information:  [facility] [address], [address] [place] Carol King   PAC  05/07/2015, 7:50 AM

## 2015-05-07 NOTE — Progress Notes (Signed)
Physical Therapy Treatment Patient Details Name: Carol King MRN: [id number] DOB: 1951/06/18 Today's Date: 2015-05-20    History of Present Illness s/p L DA THA    PT Comments    Pt motivated and progressing well but ltd this am by nausea.    Follow Up Recommendations  Home health PT     Equipment Recommendations  None recommended by PT    Recommendations for Other Services OT consult     Precautions / Restrictions Precautions Precautions: Fall Restrictions Weight Bearing Restrictions: No    Mobility  Bed Mobility Overal bed mobility: Needs Assistance Bed Mobility: Supine to Sit     Supine to sit: Supervision     General bed mobility comments: in chair  Transfers Overall transfer level: Needs assistance Equipment used: Rolling walker (2 wheeled) Transfers: Sit to/from Stand Sit to Stand: Supervision         General transfer comment: cues for UE placement  Ambulation/Gait Ambulation/Gait assistance: Min guard Ambulation Distance (Feet): 74 Feet Assistive device: Rolling walker (2 wheeled) Gait Pattern/deviations: Step-to pattern;Step-through pattern;Decreased step length - right;Decreased step length - left;Shuffle;Trunk flexed     General Gait Details: min cues for posture, position from RW and ER on L   Stairs Stairs: Yes Stairs assistance: Min assist Stair Management: One rail Left;Forwards;With cane;Step to pattern Number of Stairs: 5    Wheelchair Mobility    Modified Rankin (Stroke Patients Only)       Balance                                    Cognition Arousal/Alertness: Awake/alert Behavior During Therapy: WFL for tasks assessed/performed Overall Cognitive Status: Within Functional Limits for tasks assessed                      Exercises Total Joint Exercises Ankle Circles/Pumps: AROM;Both;15 reps;Supine Quad Sets: AROM;Both;10 reps;Supine Heel Slides: AAROM;Left;20 reps;Supine Hip  ABduction/ADduction: AAROM;Left;15 reps;Supine    General Comments        Pertinent Vitals/Pain Pain Assessment: 0-10 Pain Score: 1  Pain Location: L hip Pain Descriptors / Indicators: Sore Pain Intervention(s): Limited activity within patient's tolerance;Monitored during session;Premedicated before session;Repositioned;Ice applied    Home Living Family/patient expects to be discharged to:: Private residence Living Arrangements: Alone Available Help at Discharge: Friend(s);Available PRN/intermittently           Additional Comments: has a chair she can put in tub    Prior Function Level of Independence: Independent          PT Goals (current goals can now be found in the care plan section) Acute Rehab PT Goals Patient Stated Goal: heal Progress towards PT goals: Progressing toward goals    Frequency  7X/week    PT Plan Current plan remains appropriate    Co-evaluation             End of Session Equipment Utilized During Treatment: Gait belt Activity Tolerance: Patient tolerated treatment well;Other (comment) (nausea) Patient left: in chair;with call bell/phone within reach     Time: 0811-0851 PT Time Calculation (min) (ACUTE ONLY): 40 min  Charges:  $Gait Training: 8-22 mins $Therapeutic Exercise: 8-22 mins $Therapeutic Activity: 8-22 mins                    G Codes:      Blakely Gluth 05-20-15, 12:05 PM

## 2015-05-23 ENCOUNTER — Encounter: Payer: Self-pay | Admitting: *Deleted

## 2016-01-02 ENCOUNTER — Encounter: Payer: Self-pay | Admitting: Family Medicine

## 2016-01-07 ENCOUNTER — Encounter: Payer: Self-pay | Admitting: Family Medicine

## 2016-05-25 ENCOUNTER — Encounter: Payer: BLUE CROSS/BLUE SHIELD | Admitting: Physician Assistant

## 2016-06-08 ENCOUNTER — Encounter: Payer: Self-pay | Admitting: Physician Assistant

## 2016-06-08 ENCOUNTER — Other Ambulatory Visit: Payer: Self-pay | Admitting: Physician Assistant

## 2016-06-08 ENCOUNTER — Ambulatory Visit (INDEPENDENT_AMBULATORY_CARE_PROVIDER_SITE_OTHER): Payer: BLUE CROSS/BLUE SHIELD | Admitting: Physician Assistant

## 2016-06-08 VITALS — BP 130/72 | HR 71 | Temp 97.7°F | Resp 16 | Ht 63.0 in | Wt 109.4 lb

## 2016-06-08 DIAGNOSIS — Z23 Encounter for immunization: Secondary | ICD-10-CM | POA: Diagnosis not present

## 2016-06-08 DIAGNOSIS — Z0000 Encounter for general adult medical examination without abnormal findings: Secondary | ICD-10-CM

## 2016-06-08 DIAGNOSIS — G43809 Other migraine, not intractable, without status migrainosus: Secondary | ICD-10-CM

## 2016-06-08 DIAGNOSIS — Z13220 Encounter for screening for lipoid disorders: Secondary | ICD-10-CM

## 2016-06-08 DIAGNOSIS — I10 Essential (primary) hypertension: Secondary | ICD-10-CM

## 2016-06-08 DIAGNOSIS — Z1211 Encounter for screening for malignant neoplasm of colon: Secondary | ICD-10-CM

## 2016-06-08 DIAGNOSIS — E0590 Thyrotoxicosis, unspecified without thyrotoxic crisis or storm: Secondary | ICD-10-CM

## 2016-06-08 LAB — POCT URINALYSIS DIP (MANUAL ENTRY)
Bilirubin, UA: NEGATIVE
GLUCOSE UA: NEGATIVE
Ketones, POC UA: NEGATIVE
Leukocytes, UA: NEGATIVE
NITRITE UA: NEGATIVE
PROTEIN UA: NEGATIVE
RBC UA: NEGATIVE
Spec Grav, UA: 1.015
UROBILINOGEN UA: 0.2
pH, UA: 5.5

## 2016-06-08 LAB — CBC WITH DIFFERENTIAL/PLATELET
BASOS ABS: 0 {cells}/uL (ref 0–200)
Basophils Relative: 0 %
EOS ABS: 78 {cells}/uL (ref 15–500)
Eosinophils Relative: 1 %
HCT: 39.9 % (ref 35.0–45.0)
Hemoglobin: 13.5 g/dL (ref 11.7–15.5)
LYMPHS PCT: 50 %
Lymphs Abs: 3900 cells/uL (ref 850–3900)
MCH: 29.5 pg (ref 27.0–33.0)
MCHC: 33.8 g/dL (ref 32.0–36.0)
MCV: 87.3 fL (ref 80.0–100.0)
MPV: 9.3 fL (ref 7.5–12.5)
Monocytes Absolute: 546 cells/uL (ref 200–950)
Monocytes Relative: 7 %
Neutro Abs: 3276 cells/uL (ref 1500–7800)
Neutrophils Relative %: 42 %
Platelets: 262 10*3/uL (ref 140–400)
RBC: 4.57 MIL/uL (ref 3.80–5.10)
RDW: 14.1 % (ref 11.0–15.0)
WBC: 7.8 10*3/uL (ref 3.8–10.8)

## 2016-06-08 LAB — POC MICROSCOPIC URINALYSIS (UMFC): Mucus: ABSENT

## 2016-06-08 LAB — TSH: TSH: 0.03 mIU/L — ABNORMAL LOW

## 2016-06-08 MED ORDER — SUMATRIPTAN SUCCINATE 100 MG PO TABS: ORAL_TABLET | ORAL | Status: DC

## 2016-06-08 NOTE — Patient Instructions (Addendum)
Being Mortal by Eda Keys, MD The Bright Hour: A Memoir on Living and Dying by Auburn Bilberry    IF you received an x-ray today, you will receive an invoice from [facility]. Please contact [facility] at [phone] with questions or concerns regarding your invoice.   IF you received labwork today, you will receive an invoice from [employer]. Please contact [facility] at [phone] with questions or concerns regarding your invoice.   Our billing staff will not be able to assist you with questions regarding bills from these companies.  You will be contacted with the lab results as soon as they are available. The fastest way to get your results is to activate your My Chart account. Instructions are located on the last page of this paperwork. If you have not heard from [place] regarding the results in 2 weeks, please contact this office.

## 2016-06-08 NOTE — Progress Notes (Signed)
Patient ID: Carol King, female    DOB: [birth year], 65 y.o.   MRN: [id number]  PCP: Lamar Blinks, MD  Chief Complaint  Patient presents with  . Annual Exam    no pap  . medication    pt would like to have a rx sent in for imitrex for 1 year    Subjective:   HPI: Presents for annual wellness exam and Imitrex refill.  Cervical Cancer Screening: no abnormal pap tests, hysterectomy due to endometriosis, no longer a candidate.  Breast Cancer Screening: mammogram 05/19/2016, [facility]. "Dense," repeat in 1 year. CBE annually with Laurin Coder, NP. Colorectal Cancer Screening: Flex sig 08/23/2010, not willing to have a colonoscopy yet, it was such a bad experience Bone Density Testing: 10/03/2015. Done at [facility]. HIV Screening: no longer a candidate STI Screening: very low risk Seasonal Influenza Vaccination: annually, current Td/Tdap Vaccination: 04/23/2010 (Tdap) Pneumococcal Vaccination: due Zoster Vaccination: 12/2010, complete Frequency of Dental evaluation: Q6 months Frequency of Eye evaluation: annually, due now   Patient Active Problem List   Diagnosis Date Noted  . 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 Fibromyalgia 01/10/2014  . Post-menopausal 01/10/2014  . HTN (hypertension) 04/19/2013  . Migraine 04/19/2013  . Hyperthyroidism 04/19/2013    Past Medical History  Diagnosis Date  . Skin cancer     BCC, SCC  . Complication of anesthesia   . PONV (postoperative nausea and vomiting)     SEVERAL YRS AGO - NONE WITH MORE RECENT SURGERIES  . Hypertension   . Headache     MIGRAINES  . Arthritis   . History of skin cancer   . Difficulty sleeping     DUE TO HIP PAIN     Prior to Admission medications   Medication Sig Start Date End Date Taking? Authorizing Provider  acetaminophen (TYLENOL) 325 MG tablet Take 2 tablets (650 mg total) by mouth every 6 (six) hours as needed for mild pain (or Fever >/= 101). 05/06/15  Yes Danae Orleans, PA-C  Ascorbic Acid (VITAMIN C PO) Take 1  tablet by mouth every morning. Time Release - Emiliano Dyer.   Yes Historical Provider, MD  CALCIUM PO Take 2 tablets by mouth every morning.   Yes Historical Provider, MD  loratadine (CLARITIN) 10 MG tablet Take 10 mg by mouth every morning.   Yes Historical Provider, MD  Magnesium 250 MG TABS Take 500 mg by mouth every morning. [employer].   Yes Historical Provider, MD  Multiple Vitamin (MULTIVITAMIN WITH MINERALS) TABS tablet Take 2 tablets by mouth every morning.   Yes Historical Provider, MD  OVER THE COUNTER MEDICATION Take 2 tablets by mouth at bedtime.   Yes Historical Provider, MD  OVER THE COUNTER MEDICATION Take 2 tablets by mouth every morning. [place].   Yes Historical Provider, MD  OVER THE COUNTER MEDICATION Take 2 tablets by mouth every morning. Skeletal Strength- Natural Sunshine.   Yes Historical Provider, MD  SUMAtriptan (IMITREX) 100 MG tablet Take 1 as needed for migraineMay repeat in 2 hours if headache persists or recurs. 03/10/15  Yes Darreld Mclean, MD    Allergies  Allergen Reactions  . Darvon [Propoxyphene Hcl]     "climb the walls"  . Vicodin [Hydrocodone-Acetaminophen] Other (See Comments)    "climb the walls"    Past Surgical History  Procedure Laterality Date  . Appendectomy    . Tonsillectomy and adenoidectomy    . Joint replacement Right 07/11/2011    HIP; [facility]  .  Abdominal hysterectomy  1981  . Skin cancer excision    . Total hip arthroplasty Left 05/06/2015    Procedure: LEFT TOTAL HIP ARTHROPLASTY ANTERIOR APPROACH;  Surgeon: Paralee Cancel, MD;  Location: [facility];  Service: Orthopedics;  Laterality: Left;    Family History  Problem Relation Age of Onset  . Cancer Mother   . Cancer Maternal Grandmother   . Kidney disease Maternal Grandfather   . Arthritis Paternal Grandmother     Social History   Social History  . Marital Status: Single    Spouse Name: n/a  . Number of Children: 0  . Years of Education:  college   Occupational History  . Publishing copy    Social History Main Topics  . Smoking status: Former Smoker    Quit date: 04/30/1994  . Smokeless tobacco: Former Systems developer    Quit date: 03/09/1993  . Alcohol Use: No  . Drug Use: No  . Sexual Activity: No   Other Topics Concern  . None   Social History Narrative   Lives alone.       Review of Systems  Constitutional: Negative.   HENT: Negative.   Eyes: Negative.   Respiratory: Negative.   Cardiovascular: Negative.   Gastrointestinal: Negative.   Genitourinary: Negative.   Musculoskeletal: Negative.   Skin: Negative.   Neurological: Negative.   Psychiatric/Behavioral: Negative.         Objective:  Physical Exam  Constitutional: She is oriented to person, place, and time. Vital signs are normal. She appears well-developed and well-nourished. She is active and cooperative. No distress.  BP 130/72 mmHg  Pulse 71  Temp(Src) 97.7 F (36.5 C) (Oral)  Resp 16  Ht 5\' 3"  (1.6 m)  Wt 109 lb 6.4 oz (49.624 kg)  BMI 19.38 kg/m2  SpO2 96%   HENT:  Head: Normocephalic and atraumatic.  Right Ear: Hearing, tympanic membrane, external ear and ear canal normal. No foreign bodies.  Left Ear: Hearing, tympanic membrane, external ear and ear canal normal. No foreign bodies.  Nose: Nose normal.  Mouth/Throat: Uvula is midline, oropharynx is clear and moist and mucous membranes are normal. No oral lesions. Normal dentition. No dental abscesses or uvula swelling. No oropharyngeal exudate.  Eyes: Conjunctivae, EOM and lids are normal. Pupils are equal, round, and reactive to light. Right eye exhibits no discharge. Left eye exhibits no discharge. No scleral icterus.  Fundoscopic exam:      The right eye shows no arteriolar narrowing, no AV nicking, no exudate, no hemorrhage and no papilledema. The right eye shows red reflex.       The left eye shows no arteriolar narrowing, no AV nicking, no exudate, no hemorrhage and no  papilledema. The left eye shows red reflex.  Neck: Trachea normal, normal range of motion and full passive range of motion without pain. Neck supple. No spinous process tenderness and no muscular tenderness present. No thyroid mass and no thyromegaly present.  Cardiovascular: Normal rate, regular rhythm, normal heart sounds, intact distal pulses and normal pulses.   Pulmonary/Chest: Effort normal and breath sounds normal.  Musculoskeletal: She exhibits no edema or tenderness.       Cervical back: Normal.       Thoracic back: Normal.       Lumbar back: Normal.  Lymphadenopathy:       Head (right side): No tonsillar, no preauricular, no posterior auricular and no occipital adenopathy present.       Head (left side): No tonsillar, no preauricular,  no posterior auricular and no occipital adenopathy present.    She has no cervical adenopathy.       Right: No supraclavicular adenopathy present.       Left: No supraclavicular adenopathy present.  Neurological: She is alert and oriented to person, place, and time. She has normal strength and normal reflexes. No cranial nerve deficit. She exhibits normal muscle tone. Coordination and gait normal.  Skin: Skin is warm, dry and intact. No rash noted. She is not diaphoretic. No cyanosis or erythema. Nails show no clubbing.  Psychiatric: She has a normal mood and affect. Her speech is normal and behavior is normal. Judgment and thought content normal.           Assessment & Plan:  1. Annual physical exam Age appropriate anticipatory guidance provided.  2. Screening for colon cancer She does not want a colonoscopy. Is willing to collect iFOBT. - POC Hemoccult Bld/Stl (3-Cd Home Screen); Future  3. Need for pneumococcal vaccination Prevnar 13 today. Pneumovax 23 at her next wellness visit. - Pneumococcal conjugate vaccine 13-valent IM  4. Screening for hyperlipidemia - Lipid panel  5. Essential hypertension Controlled. Stable. - CBC with  Differential/Platelet - Comprehensive metabolic panel - POCT urinalysis dipstick - POCT Microscopic Urinalysis ([facility])  6. Hyperthyroidism Stable. - TSH  7. Other migraine without status migrainosus, not intractable Stable.. - SUMAtriptan (IMITREX) 100 MG tablet; Take 1 as needed for migraineMay repeat in 2 hours if headache persists or recurs.  Dispense: 9 tablet; Refill: 12   Fara Chute, PA-C Physician Assistant-Certified [facility]

## 2016-06-09 LAB — COMPREHENSIVE METABOLIC PANEL
ALK PHOS: 72 U/L (ref 33–130)
ALT: 31 U/L — AB (ref 6–29)
AST: 27 U/L (ref 10–35)
Albumin: 3.9 g/dL (ref 3.6–5.1)
BUN: 16 mg/dL (ref 7–25)
CALCIUM: 9.4 mg/dL (ref 8.6–10.4)
CHLORIDE: 103 mmol/L (ref 98–110)
CO2: 23 mmol/L (ref 20–31)
Creat: 0.72 mg/dL (ref 0.50–0.99)
GLUCOSE: 91 mg/dL (ref 65–99)
POTASSIUM: 4.5 mmol/L (ref 3.5–5.3)
Sodium: 139 mmol/L (ref 135–146)
Total Bilirubin: 0.3 mg/dL (ref 0.2–1.2)
Total Protein: 6.3 g/dL (ref 6.1–8.1)

## 2016-06-09 LAB — LIPID PANEL
Cholesterol: 187 mg/dL (ref 125–200)
HDL: 79 mg/dL (ref >=46)
LDL CALC: 89 mg/dL (ref <130)
Total CHOL/HDL Ratio: 2.4 Ratio (ref <=5.0)
Triglycerides: 95 mg/dL (ref <150)
VLDL: 19 mg/dL (ref <30)

## 2016-06-13 LAB — T4, FREE: FREE T4: 1.9 ng/dL — AB (ref 0.8–1.8)

## 2016-06-13 LAB — T3, FREE: T3, Free: 4 pg/mL (ref 2.3–4.2)

## 2016-11-23 ENCOUNTER — Ambulatory Visit (INDEPENDENT_AMBULATORY_CARE_PROVIDER_SITE_OTHER): Payer: BLUE CROSS/BLUE SHIELD | Admitting: Podiatry

## 2016-11-23 ENCOUNTER — Encounter: Payer: Self-pay | Admitting: Podiatry

## 2016-11-23 VITALS — BP 177/112 | HR 97 | Resp 14 | Ht 63.0 in | Wt 108.0 lb

## 2016-11-23 DIAGNOSIS — L03032 Cellulitis of left toe: Secondary | ICD-10-CM

## 2016-11-23 DIAGNOSIS — L02612 Cutaneous abscess of left foot: Secondary | ICD-10-CM

## 2016-11-23 MED ORDER — CEPHALEXIN 500 MG PO CAPS: 500.0000 mg | ORAL_CAPSULE | Freq: Two times a day (BID) | ORAL | 1 refills | Status: DC

## 2016-11-23 NOTE — Progress Notes (Signed)
   Subjective:    Patient ID: Carol King, female    DOB: 1951/05/12, 65 y.o.   MRN: [id number]  HPI    This patient presents today complaining of a one-month history of a painful, swollen fifth left toe. Patient states she soaking her foot in Epsom salts, applying Band-Aids and wearing protective shields. The fifth is not improved or worsened since symptoms began. Patient relates previous history of this problem a year ago which resolved when patient wore open toe shoes. The symptoms develop upon wearing closed shoes.  Review of Systems  All other systems reviewed and are negative.      Objective:   Physical Exam  Orientated 3  Vascular: No calf edema or calf tenderness bilaterally DP and PT pulses 2/4 bilaterally Capillary reflex within normal limits bilaterally General rubor in coldness noted in distal toes 1 through 5 bilaterally  Neurological: Sensation to 10 g monofilament wire intact 5/5 bilaterally Vibratory sensation reactive bilaterally Ankle reflex equal and reactive bilaterally  Dermatological: No open skin lesions bilaterally Low-grade edema and erythema with fluctuance on the dorsal lateral aspect of fifth left toe. There is some dried blood in the dorsal lateral aspect fifth left toe. 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 I informed patient that today the area was too inflamed and that would not be an option for at this time. Patient would like to consider having this done after all the inflammatory changes are resolved and would present in June 2018 for possible surgical evaluation  Reappoint as needed or scheduled surgical consult with patient with Dr. Amalia Hailey in June 2018

## 2016-11-23 NOTE — Patient Instructions (Signed)
At shoe out over fifth left toe and consider having elastic attach the outside of shoe If there is no drainage apply Vaseline to the area. If drainage apply triple antibiotic ointment Completes her antibiotics by mouth 1 capsule twice a day 7 days Return as needed

## 2017-05-23 ENCOUNTER — Ambulatory Visit: Payer: BLUE CROSS/BLUE SHIELD | Admitting: Podiatry

## 2017-05-27 ENCOUNTER — Encounter: Payer: BLUE CROSS/BLUE SHIELD | Admitting: Physician Assistant

## 2017-06-10 ENCOUNTER — Encounter: Payer: Self-pay | Admitting: Physician Assistant

## 2017-06-10 ENCOUNTER — Ambulatory Visit (INDEPENDENT_AMBULATORY_CARE_PROVIDER_SITE_OTHER): Payer: BLUE CROSS/BLUE SHIELD | Admitting: Physician Assistant

## 2017-06-10 VITALS — BP 120/73 | HR 93 | Temp 98.0°F | Resp 18 | Ht 63.0 in | Wt 108.4 lb

## 2017-06-10 DIAGNOSIS — Z85828 Personal history of other malignant neoplasm of skin: Secondary | ICD-10-CM | POA: Diagnosis not present

## 2017-06-10 DIAGNOSIS — G43709 Chronic migraine without aura, not intractable, without status migrainosus: Secondary | ICD-10-CM

## 2017-06-10 DIAGNOSIS — E2839 Other primary ovarian failure: Secondary | ICD-10-CM | POA: Diagnosis not present

## 2017-06-10 DIAGNOSIS — Z23 Encounter for immunization: Secondary | ICD-10-CM | POA: Diagnosis not present

## 2017-06-10 DIAGNOSIS — Z1211 Encounter for screening for malignant neoplasm of colon: Secondary | ICD-10-CM

## 2017-06-10 DIAGNOSIS — I10 Essential (primary) hypertension: Secondary | ICD-10-CM | POA: Diagnosis not present

## 2017-06-10 DIAGNOSIS — Z0000 Encounter for general adult medical examination without abnormal findings: Secondary | ICD-10-CM

## 2017-06-10 DIAGNOSIS — E0590 Thyrotoxicosis, unspecified without thyrotoxic crisis or storm: Secondary | ICD-10-CM

## 2017-06-10 DIAGNOSIS — Z13220 Encounter for screening for lipoid disorders: Secondary | ICD-10-CM | POA: Diagnosis not present

## 2017-06-10 LAB — POCT URINALYSIS DIP (MANUAL ENTRY)
Bilirubin, UA: NEGATIVE
Blood, UA: NEGATIVE
Glucose, UA: NEGATIVE mg/dL
Ketones, POC UA: NEGATIVE mg/dL
LEUKOCYTES UA: NEGATIVE
Nitrite, UA: NEGATIVE
Protein Ur, POC: NEGATIVE mg/dL
Spec Grav, UA: 1.02 (ref 1.010–1.025)
Urobilinogen, UA: 0.2 E.U./dL
pH, UA: 5.5 (ref 5.0–8.0)

## 2017-06-10 MED ORDER — SUMATRIPTAN SUCCINATE 100 MG PO TABS: ORAL_TABLET | ORAL | 12 refills | Status: AC

## 2017-06-10 NOTE — Progress Notes (Signed)
Patient ID: Carol King, female    DOB: 07/20/1951, 66 y.o.   MRN: [id number]  PCP: Harrison Mons, PA-C  Chief Complaint  Patient presents with  . Annual Exam    Subjective:   Presents for [employer].  She has had another BCC excised, from her back. This is the 3rd lesion. She is well overall, without complaints.  Cervical Cancer Screening: no longer a candidate Breast Cancer Screening: normal mammogram 05/2016 Colorectal Cancer Screening: no colonoscopy since 08/23/2000. Negative hemosure last year. Bone Density Testing: not yet HIV Screening: very low risk, no longer a candidate for screening STI Screening: very low risk Seasonal Influenza Vaccination: recommend annually Td/Tdap Vaccination: 04/2010 Pneumococcal Vaccination: Has received [id number]-81. Needs Pneumovax 23 Zoster Vaccination: Has received Zostavax, and the 1st of 2 doses of Shingrix Frequency of Dental evaluation: Q6 months Frequency of Eye evaluation: annually    Patient Active Problem List   Diagnosis Date Noted  . History of basal cell carcinoma (BCC) 06/10/2017  . 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 Fibromyalgia 01/10/2014  . Post-menopausal 01/10/2014  . HTN (hypertension) 04/19/2013  . Migraine 04/19/2013  . Hyperthyroidism 04/19/2013    Past Medical History:  Diagnosis Date  . Arthritis   . Complication of anesthesia   . Difficulty sleeping    DUE TO HIP PAIN  . Headache    MIGRAINES  . History of skin cancer   . Hypertension   . PONV (postoperative nausea and vomiting)    SEVERAL YRS AGO - NONE WITH MORE RECENT SURGERIES  . Skin cancer    BCC, SCC     Prior to Admission medications   Medication Sig Start Date End Date Taking? Authorizing Provider  Ascorbic Acid (VITAMIN C PO) Take 1 tablet by mouth every morning. Time Release - Emiliano Dyer.   Yes [provider]  loratadine (CLARITIN) 10 MG tablet Take 10 mg by mouth every morning.   Yes [provider]  Magnesium 250 MG TABS Take 500 mg by mouth every morning. [employer].   Yes [provider]  Multiple Vitamin (MULTIVITAMIN WITH MINERALS) TABS tablet Take 2 tablets by mouth every morning.   Yes [provider]  OVER THE COUNTER MEDICATION Take 2 tablets by mouth at bedtime.   Yes [provider]  OVER THE COUNTER MEDICATION Take 2 tablets by mouth every morning. [place].   Yes [provider]  OVER THE COUNTER MEDICATION Take 2 tablets by mouth every morning. Skeletal Strength- Natural Sunshine.   Yes [provider]  SUMAtriptan (IMITREX) 100 MG tablet Take 1 as needed for migraineMay repeat in 2 hours if headache persists or recurs. 06/08/16  Yes Lotoya Casella, PA-C  acetaminophen (TYLENOL) 325 MG tablet Take 2 tablets (650 mg total) by mouth every 6 (six) hours as needed for mild pain (or Fever >/= 101). Patient not taking: Reported on 11/23/2016 05/06/15   Danae Orleans, PA-C  CALCIUM PO Take 2 tablets by mouth every morning.    [provider]    Allergies  Allergen Reactions  . Darvon [Propoxyphene Hcl]     "climb the walls"  . Vicodin [Hydrocodone-Acetaminophen] Other (See Comments)    "climb the walls"    Past Surgical History:  Procedure Laterality Date  . ABDOMINAL HYSTERECTOMY  1981   one ovary remains  . APPENDECTOMY    . SKIN CANCER EXCISION    . TONSILLECTOMY AND ADENOIDECTOMY    .  TOTAL HIP ARTHROPLASTY Right 07/11/2011   HIP; [facility]  . TOTAL HIP ARTHROPLASTY Left 05/06/2015   Procedure: LEFT TOTAL HIP ARTHROPLASTY ANTERIOR APPROACH;  Surgeon: Paralee Cancel, MD;  Location: [facility];  Service: Orthopedics;  Laterality: Left;    Family History  Problem Relation Age of Onset  . Cancer Mother 58       breast cancer  . Cancer Maternal Grandmother        breast cancer  . Kidney disease Maternal Grandfather   . Arthritis Paternal Grandmother     Social History   Social  History  . Marital status: Single    Spouse name: n/a  . Number of children: 0  . Years of education: college   Occupational History  . 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 Smoking status: Former Smoker    Quit date: 04/30/1994  . Smokeless tobacco: Former Systems developer    Quit date: 03/09/1993  . Alcohol use No     Comment: very rare drink  . Drug use: No  . Sexual activity: No   Other Topics Concern  . None   Social History Narrative   Home Environment:Lives at home alone with her two dogs. One level home with a few stairs to the front and back doors.   Diet: Stays clear of carbohydrates and chocolate because they trigger headaches. Likes meat/chicken and vegetables. Generally healthy.   Exercise: Stays busy and active at work. Takes the stairs instead of the elevator.   Sleep: Significantly improved since hip replacement surgeries. Goes to bed around 9 and wakes up at 6. Able to work full days.   Work: Still enjoys working at [employer].   Urinary problems: No concerns. Hydrates well.      Brother lives in [place], [place]          Review of Systems  Constitutional: Negative.   HENT: Negative.   Eyes: Negative.   Respiratory: Negative.   Cardiovascular: Negative.   Gastrointestinal: Negative.   Endocrine: Negative.   Genitourinary: Negative.   Musculoskeletal: Negative.   Skin: Positive for rash (small cluster of papules where the bandaid was covering the adjacent wound). Wound: LEFT dorsal wrist, her dog accidentally scratched her.  Allergic/Immunologic: Negative.   Neurological: Negative.   Hematological: Negative.   Psychiatric/Behavioral: Negative.         Objective:  Physical Exam  Constitutional: She is oriented to person, place, and time. Vital signs are normal. She appears well-developed and well-nourished. She is active and cooperative. No distress.  BP 120/73 (BP Location: Right Arm, Patient Position: Sitting, Cuff  Size: Small)   Pulse 93   Temp 98 F (36.7 C) (Oral)   Resp 18   Ht 5\' 3"  (1.6 m)   Wt 108 lb 6.4 oz (49.2 kg)   SpO2 100%   BMI 19.20 kg/m    HENT:  Head: Normocephalic and atraumatic.  Right Ear: Hearing, tympanic membrane, external ear and ear canal normal. No foreign bodies.  Left Ear: Hearing, tympanic membrane, external ear and ear canal normal. No foreign bodies.  Nose: Nose normal.  Mouth/Throat: Uvula is midline, oropharynx is clear and moist and mucous membranes are normal. No oral lesions. Normal dentition. No dental abscesses or uvula swelling. No oropharyngeal exudate.  Eyes: Conjunctivae, EOM and lids are normal. Pupils are equal, round, and reactive to light. Right eye exhibits no discharge. Left eye exhibits no discharge. No scleral icterus.  Fundoscopic exam:  The right eye shows no arteriolar narrowing, no AV nicking, no exudate, no hemorrhage and no papilledema. The right eye shows red reflex.       The left eye shows no arteriolar narrowing, no AV nicking, no exudate, no hemorrhage and no papilledema. The left eye shows red reflex.  Neck: Trachea normal, normal range of motion and full passive range of motion without pain. Neck supple. No spinous process tenderness and no muscular tenderness present. No thyroid mass and no thyromegaly present.  Cardiovascular: Normal rate, regular rhythm, normal heart sounds, intact distal pulses and normal pulses.   Pulmonary/Chest: Effort normal and breath sounds normal. Right breast exhibits no inverted nipple, no mass, no nipple discharge, no skin change and no tenderness. Left breast exhibits no inverted nipple, no mass, no nipple discharge, no skin change and no tenderness. Breasts are symmetrical.  Musculoskeletal: She exhibits no edema or tenderness.       Cervical back: Normal.       Thoracic back: Normal.       Lumbar back: Normal.  Lymphadenopathy:       Head (right side): No tonsillar, no preauricular, no posterior  auricular and no occipital adenopathy present.       Head (left side): No tonsillar, no preauricular, no posterior auricular and no occipital adenopathy present.    She has no cervical adenopathy.       Right: No supraclavicular adenopathy present.       Left: No supraclavicular adenopathy present.  Neurological: She is alert and oriented to person, place, and time. She has normal strength and normal reflexes. No cranial nerve deficit. She exhibits normal muscle tone. Coordination and gait normal.  Skin: Skin is warm, dry and intact. No rash noted. She is not diaphoretic. No cyanosis or erythema. Nails show no clubbing.     Psychiatric: She has a normal mood and affect. Her speech is normal and behavior is normal. Judgment and thought content normal.    Results for orders placed or performed in visit on 06/10/17  POCT urinalysis dipstick  Result Value Ref Range   Color, UA yellow yellow   Clarity, UA clear clear   Glucose, UA negative negative mg/dL   Bilirubin, UA negative negative   Ketones, POC UA negative negative mg/dL   Spec Grav, UA 1.020 1.010 - 1.025   Blood, UA negative negative   pH, UA 5.5 5.0 - 8.0   Protein Ur, POC negative negative mg/dL   Urobilinogen, UA 0.2 0.2 or 1.0 E.U./dL   Nitrite, UA Negative Negative   Leukocytes, UA Negative Negative          Assessment & Plan:   Problem List Items Addressed This Visit    Migraine (Chronic)    Stable. Continue current treatment. RF sumatriptan.      Relevant Medications   SUMAtriptan (IMITREX) 100 MG tablet   Hyperthyroidism (Chronic)    Update labs.      Relevant Orders   TSH   RESOLVED: HTN (hypertension) (Chronic)    Developed when she had hip pain. The hip pain is resolved s/p THR. BP has been well controlled, without treatment, since then. Resolving this problem.      Relevant Orders   CBC with Differential/Platelet   POCT urinalysis dipstick (Completed)   Comprehensive metabolic panel   History  of basal cell carcinoma (BCC)    Continue follow-up per dermatology.       Other Visit Diagnoses    Annual physical exam    -  Primary   Estrogen deficiency       Relevant Orders   DG Bone Density   Screening for colon cancer       Relevant Orders   Cologuard   Need for pneumococcal vaccination       Relevant Orders   Pneumococcal polysaccharide vaccine 23-valent greater than or equal to 2yo subcutaneous/IM (Completed)   Care order/instruction: (Completed)   Screening for hyperlipidemia       Relevant Orders   Lipid panel       Return in about 1 year (around 06/10/2018) for Wellness Visit.   Fara Chute, PA-C [facility] [place]

## 2017-06-10 NOTE — Progress Notes (Signed)
Subjective:    Patient ID: Carol King, female    DOB: [birth year], 66 y.o.   MRN: [id number] PCP: Harrison Mons, PA-C  HPI Presenting for annual wellness exam.  Following with dermatology regularly for basal cell carcinoma. Most recently had lesion excised from the back of her left shoulder (stage 0) last month. Next appointment is 07/2017. She is concerned about a lesion on her left wrist that she noticed after removing a band aid this morning. The band aid was covering a wound from her dog's nail after the dog jumped up on her. She noticed the lesion is red and tender and would like it checked out for reassurance. Allergies well-controlled. Migraines come in "bunches"- 2-3 days in a row at a time. Imitrex significantly helps - she would like a refill of this today.  Date of last exam: 06/08/2016  Cancer Screening: Cervical: no longer a candidate. no h/o abnormal pap smears, hysterectomy due to endometriosis. Breast: last mammogram 05/19/2016 (negative). Due for mammogram. CBE annually. Colon: flex sig 08/23/2010. Still unwilling to have a colonoscopy but agrees to q3 years cologuard. Prostate: N/A  Other Screening: Bone density testing: 10/03/2015 [facility] HIV screening: no longer a candidate STI screening: low risk Last screening for diabetes: N/A Last lipid screening: 06/08/2016  TC 187 TG 95 HDL 79 LDL 89  Advanced Directives: Discussed: No On File: Yes Materials Provided: No  Immunization status:  Seasonal influenza vaccination: annually Td/TDaP vaccination: 04/23/2010 (TDaP) Pneumococcal vaccination: first dose administered 06/08/2016. Due for second dose. Zoster vaccination: complete 12/2010 Shingrix: completed first dose 04/2017 - second dose in 2-6 months  Frequency of dental evaluations: q6 months Frequency of eye evaluations: annually  Home Environment: Lives at home alone with her two dogs. One level home with a few stairs to the front and back  doors. Diet: Stays clear of carbohydrates and chocolate because they trigger headaches. Likes meat/chicken and vegetables. Generally healthy. Exercise: Stays busy and active at work. Takes the stairs instead of the elevator. Sleep: Significantly improved since hip replacement surgeries. Goes to bed around 9 and wakes up at 6. Able to work full days. Work: Still enjoys working at [employer]. Urinary problems: No concerns. Hydrates well.   Patient Active Problem List   Diagnosis Date Noted  . 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 Fibromyalgia 01/10/2014  . Post-menopausal 01/10/2014  . HTN (hypertension) 04/19/2013  . Migraine 04/19/2013  . Hyperthyroidism 04/19/2013   Prior to Admission medications   Medication Sig Start Date End Date Taking? Authorizing Provider  acetaminophen (TYLENOL) 325 MG tablet Take 2 tablets (650 mg total) by mouth every 6 (six) hours as needed for mild pain (or Fever >/= 101). Patient not taking: Reported on 11/23/2016 05/06/15   Danae Orleans, PA-C  Ascorbic Acid (VITAMIN C PO) Take 1 tablet by mouth every morning. Time Release - Emiliano Dyer.    [provider]  CALCIUM PO Take 2 tablets by mouth every morning.    [provider]  cephALEXin (KEFLEX) 500 MG capsule Take 1 capsule (500 mg total) by mouth 2 (two) times daily. 11/23/16   Tuchman, Richard C, DPM  loratadine (CLARITIN) 10 MG tablet Take 10 mg by mouth every morning.    [provider]  Magnesium 250 MG TABS Take 500 mg by mouth every morning. [employer].    [provider]  Multiple Vitamin (MULTIVITAMIN WITH MINERALS) TABS tablet Take 2 tablets by mouth every morning.    [provider]  OVER THE COUNTER MEDICATION Take 2 tablets by mouth at bedtime.    [provider]  OVER THE COUNTER MEDICATION Take 2 tablets by mouth every morning. [place].    [provider]  OVER THE COUNTER MEDICATION Take 2 tablets by  mouth every morning. Skeletal Strength- Natural Sunshine.    [provider]  SUMAtriptan (IMITREX) 100 MG tablet Take 1 as needed for migraineMay repeat in 2 hours if headache persists or recurs. 06/08/16   Harrison Mons, PA-C   Allergies  Allergen Reactions  . Darvon [Propoxyphene Hcl]     "climb the walls"  . Vicodin [Hydrocodone-Acetaminophen] Other (See Comments)    "climb the walls"    Depression screen [facility] 2/9 06/10/2017 06/08/2016 07/15/2014  Decreased Interest 0 0 0  Down, Depressed, Hopeless 0 0 0  PHQ - 2 Score 0 0 0    Review of Systems  Constitutional: Negative for appetite change, chills, fatigue and fever.  HENT: Negative for congestion, rhinorrhea, sneezing and sore throat.   Eyes: Negative for itching.  Respiratory: Negative for shortness of breath.   Cardiovascular: Negative for chest pain.  Gastrointestinal: Negative for abdominal pain, constipation, diarrhea, nausea and vomiting.  Genitourinary: Negative for frequency and hematuria.  Musculoskeletal: Negative for arthralgias.  Skin: Negative for color change.  Neurological: Negative for dizziness.       Objective:   Physical Exam  Constitutional: She appears well-developed and well-nourished.  BP 120/73 (BP Location: Right Arm, Patient Position: Sitting, Cuff Size: Small)   Pulse 93   Temp 98 F (36.7 C) (Oral)   Resp 18   Ht 5\' 3"  (1.6 m)   Wt 108 lb 6.4 oz (49.2 kg)   SpO2 100%   BMI 19.20 kg/m    HENT:  Head: Normocephalic and atraumatic.  Right Ear: External ear normal.  Left Ear: External ear normal.  Nose: Nose normal.  Eyes: EOM are normal.  Neck: Normal range of motion. Neck supple. No thyroid mass and no thyromegaly present.  Cardiovascular: Normal rate, regular rhythm, normal heart sounds and intact distal pulses.   Pulses:      Radial pulses are 2+ on the right side, and 2+ on the left side.       Posterior tibial pulses are 2+ on the right side, and 2+ on the left side.   Pulmonary/Chest: Breath sounds normal.  Abdominal: Soft. Bowel sounds are normal. She exhibits no distension. There is no tenderness.  Genitourinary: No breast swelling, tenderness, discharge or bleeding. Pelvic exam was performed with patient supine.  Lymphadenopathy:    She has no cervical adenopathy.  Neurological: She is alert. She has normal reflexes.  Skin: Skin is warm and dry.  White bandage approximately 2-3 inches covering recently removed basal cell carcinoma on posterior left shoulder. Single round 28mm erythematous lesion noted on posterior left wrist with mild central blistering. Appears to be reaction from bandage. Scab noted 71mm laterally.  Psychiatric: She has a normal mood and affect. Her behavior is normal.      Assessment & Plan:   1. Annual physical exam Labs pending. Follow up in 1 year. - CBC with Differential/Platelet - POCT urinalysis dipstick - Comprehensive metabolic panel - TSH - Lipid panel  2. Other migraine without status migrainosus, not intractable Stable. Imitrex refilled. - SUMAtriptan (IMITREX) 100 MG tablet; Take 1 as needed for migraineMay repeat in 2 hours if headache persists or recurs.  Dispense: 9 tablet; Refill: 12  3.  History of basal cell carcinoma (BCC) Following with Dr. Elvera Lennox every 3 months. Most recent lesion removed last month (stage 0). Next appointment 07/2017.  4. Estrogen deficiency DXA ordered. - DG Bone Density; Future  5. Hyperthyroidism Labs pending. - TSH  6. Screening for colon cancer Discussed need for colonoscopy if positive. - Cologuard  7. Essential hypertension Resolved following hip replacement and patient is no longer on medication. Follow up in 1 year.  8. Need for pneumococcal vaccination Second dose administered today. - Pneumococcal polysaccharide vaccine 23-valent greater than or equal to 2yo subcutaneous/IM - Care order/instruction:

## 2017-06-10 NOTE — Assessment & Plan Note (Signed)
Continue follow-up per dermatology.

## 2017-06-10 NOTE — Assessment & Plan Note (Signed)
Developed when she had hip pain. The hip pain is resolved s/p THR. BP has been well controlled, without treatment, since then. Resolving this problem.

## 2017-06-10 NOTE — Assessment & Plan Note (Signed)
Update labs.  

## 2017-06-10 NOTE — Patient Instructions (Addendum)
   IF you received an x-ray today, you will receive an invoice from [facility]. Please contact [facility] at [phone] with questions or concerns regarding your invoice.   IF you received labwork today, you will receive an invoice from [facility]. Please contact [facility] at [phone] with questions or concerns regarding your invoice.   Our billing staff will not be able to assist you with questions regarding bills from these companies.  You will be contacted with the lab results as soon as they are available. The fastest way to get your results is to activate your My Chart account. Instructions are located on the last page of this paperwork. If you have not heard from us regarding the results in 2 weeks, please contact this office.     Keeping You Healthy  Get These Tests  Blood Pressure- Have your blood pressure checked by your healthcare provider at least once a year.  Normal blood pressure is 120/80.  Weight- Have your body mass index (BMI) calculated to screen for obesity.  BMI is a measure of body fat based on height and weight.  You can calculate your own BMI at www.nhlbisupport.com/bmi/  Cholesterol- Have your cholesterol checked every year.  Diabetes- Have your blood sugar checked every year if you have high blood pressure, high cholesterol, a family history of diabetes or if you are overweight.  Pap Test - Have a pap test every 1 to 5 years if you have been sexually active.  If you are older than 65 and recent pap tests have been normal you may not need additional pap tests.  In addition, if you have had a hysterectomy  for benign disease additional pap tests are not necessary.  Mammogram-Yearly mammograms are essential for early detection of breast cancer  Screening for Colon Cancer- Colonoscopy starting at age 50. Screening may begin sooner depending on your family history and other health conditions.  Follow up colonoscopy as directed by your  Gastroenterologist.  Screening for Osteoporosis- Screening begins at age 65 with bone density scanning, sooner if you are at higher risk for developing Osteoporosis.  Get these medicines  Calcium with Vitamin D- Your body requires 1200-1500 mg of Calcium a day and 800-1000 IU of Vitamin D a day.  You can only absorb 500 mg of Calcium at a time therefore Calcium must be taken in 2 or 3 separate doses throughout the day.  Hormones- Hormone therapy has been associated with increased risk for certain cancers and heart disease.  Talk to your healthcare provider about if you need relief from menopausal symptoms.  Aspirin- Ask your healthcare provider about taking Aspirin to prevent Heart Disease and Stroke.  Get these Immuniztions  Flu shot- Every fall  Pneumonia shot- Once after the age of 65; if you are younger ask your healthcare provider if you need a pneumonia shot.  Tetanus- Every ten years.  Zostavax- Once after the age of 60 to prevent shingles.  Take these steps  Don't smoke- Your healthcare provider can help you quit. For tips on how to quit, ask your healthcare provider or go to www.smokefree.gov or call 1-800 QUIT-NOW.  Be physically active- Exercise 5 days a week for a minimum of 30 minutes.  If you are not already physically active, start slow and gradually work up to 30 minutes of moderate physical activity.  Try walking, dancing, bike riding, swimming, etc.  Eat a healthy diet- Eat a variety of healthy foods such as fruits, vegetables, whole grains, low   fat milk, low fat cheeses, yogurt, lean meats, chicken, fish, eggs, dried beans, tofu, etc.  For more information go to www.thenutritionsource.org  Dental visit- Brush and floss teeth twice daily; visit your dentist twice a year.  Eye exam- Visit your Optometrist or Ophthalmologist yearly.  Drink alcohol in moderation- Limit alcohol intake to one drink or less a day.  Never drink and drive.  Depression- Your emotional  health is as important as your physical health.  If you're feeling down or losing interest in things you normally enjoy, please talk to your healthcare provider.  Seat Belts- can save your life; always wear one  Smoke/Carbon Monoxide detectors- These detectors need to be installed on the appropriate level of your home.  Replace batteries at least once a year.  Violence- If anyone is threatening or hurting you, please tell your healthcare provider.  Living Will/ Health care power of attorney- Discuss with your healthcare provider and family.  

## 2017-06-10 NOTE — Assessment & Plan Note (Signed)
Stable. Continue current treatment. RF sumatriptan.

## 2017-06-11 LAB — COMPREHENSIVE METABOLIC PANEL
ALT: 40 IU/L — ABNORMAL HIGH (ref 0–32)
AST: 30 IU/L (ref 0–40)
Albumin/Globulin Ratio: 1.9 (ref 1.2–2.2)
Albumin: 4 g/dL (ref 3.6–4.8)
Alkaline Phosphatase: 108 IU/L (ref 39–117)
BUN/Creatinine Ratio: 41 — ABNORMAL HIGH (ref 12–28)
BUN: 23 mg/dL (ref 8–27)
Bilirubin Total: 0.3 mg/dL (ref 0.0–1.2)
CALCIUM: 9.7 mg/dL (ref 8.7–10.3)
CO2: 22 mmol/L (ref 20–29)
Chloride: 103 mmol/L (ref 96–106)
Creatinine, Ser: 0.56 mg/dL — ABNORMAL LOW (ref 0.57–1.00)
GFR, EST AFRICAN AMERICAN: 112 mL/min/{1.73_m2} (ref >59)
GFR, EST NON AFRICAN AMERICAN: 97 mL/min/{1.73_m2} (ref >59)
Globulin, Total: 2.1 g/dL (ref 1.5–4.5)
Glucose: 88 mg/dL (ref 65–99)
Potassium: 4.5 mmol/L (ref 3.5–5.2)
SODIUM: 140 mmol/L (ref 134–144)
Total Protein: 6.1 g/dL (ref 6.0–8.5)

## 2017-06-11 LAB — CBC WITH DIFFERENTIAL/PLATELET
BASOS: 0 %
Basophils Absolute: 0 10*3/uL (ref 0.0–0.2)
EOS (ABSOLUTE): 0.1 10*3/uL (ref 0.0–0.4)
Eos: 1 %
Hematocrit: 37.5 % (ref 34.0–46.6)
Hemoglobin: 11.9 g/dL (ref 11.1–15.9)
Immature Grans (Abs): 0 10*3/uL (ref 0.0–0.1)
Immature Granulocytes: 0 %
LYMPHS ABS: 5.3 10*3/uL — AB (ref 0.7–3.1)
Lymphs: 56 %
MCH: 26.7 pg (ref 26.6–33.0)
MCHC: 31.7 g/dL (ref 31.5–35.7)
MCV: 84 fL (ref 79–97)
Monocytes Absolute: 0.7 10*3/uL (ref 0.1–0.9)
Monocytes: 7 %
Neutrophils Absolute: 3.5 10*3/uL (ref 1.4–7.0)
Neutrophils: 36 %
Platelets: 256 10*3/uL (ref 150–379)
RBC: 4.45 x10E6/uL (ref 3.77–5.28)
RDW: 13.9 % (ref 12.3–15.4)
WBC: 9.7 10*3/uL (ref 3.4–10.8)

## 2017-06-11 LAB — LIPID PANEL
CHOL/HDL RATIO: 2.4 ratio (ref 0.0–4.4)
CHOLESTEROL TOTAL: 186 mg/dL (ref 100–199)
HDL: 79 mg/dL (ref >39)
LDL Calculated: 96 mg/dL (ref 0–99)
Triglycerides: 53 mg/dL (ref 0–149)
VLDL Cholesterol Cal: 11 mg/dL (ref 5–40)

## 2017-06-11 LAB — TSH: TSH: <0.006 u[IU]/mL — ABNORMAL LOW (ref 0.450–4.500)

## 2017-06-19 LAB — SPECIMEN STATUS REPORT

## 2017-06-19 LAB — T4, FREE: Free T4: 3.51 ng/dL — ABNORMAL HIGH (ref 0.82–1.77)

## 2017-06-20 NOTE — Addendum Note (Signed)
Addended by: Fara Chute on: 06/20/2017 02:47 PM   Modules accepted: Orders

## 2017-06-27 ENCOUNTER — Ambulatory Visit (INDEPENDENT_AMBULATORY_CARE_PROVIDER_SITE_OTHER): Payer: BLUE CROSS/BLUE SHIELD

## 2017-06-27 ENCOUNTER — Encounter: Payer: Self-pay | Admitting: Podiatry

## 2017-06-27 ENCOUNTER — Ambulatory Visit (INDEPENDENT_AMBULATORY_CARE_PROVIDER_SITE_OTHER): Payer: BLUE CROSS/BLUE SHIELD | Admitting: Podiatry

## 2017-06-27 DIAGNOSIS — M79675 Pain in left toe(s): Secondary | ICD-10-CM | POA: Diagnosis not present

## 2017-06-27 DIAGNOSIS — M2042 Other hammer toe(s) (acquired), left foot: Secondary | ICD-10-CM

## 2017-06-27 NOTE — Patient Instructions (Signed)

## 2017-06-27 NOTE — Progress Notes (Signed)
   HPI: Patient presents today for complaint of a hammertoe to the fifth digit left foot. She states that anytime she wears closed toe shoes for multiple days she develops a sore blister over her fifth toe. Patient's been dealing with this off and on for several years and it is more significant during the wintertime when she wears closed toe shoes. Patient presents today for surgical consultation   Physical Exam: General: The patient is alert and oriented x3 in no acute distress.  Dermatology: Skin is warm, dry and supple bilateral lower extremities. Negative for open lesions or macerations.  Vascular: Palpable pedal pulses bilaterally. No edema or erythema noted. Capillary refill within normal limits.  Neurological: Epicritic and protective threshold grossly intact bilaterally.   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 There is a more mild adductovarus deformity to the fourth digit left foot as well. This is nonsymptomatic. Range of motion within normal limits to all pedal and ankle joints bilateral. Muscle strength 5/5 in all groups bilateral.   Radiographic Exam:  There is contracture at the DIPJ fifth digit left foot consistent with adductovarus hammertoe deformity. Normal osseous mineralization. No fractures identified. Joint spaces preserved  Assessment: 1. 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 Patient was evaluated. X-rays reviewed today 2. Today we discussed the conservative versus surgical management of the presenting pathology. The patient opts for surgical management. All possible complications and details of the procedure were explained. All patient questions were answered. No guarantees were expressed or implied. 3. Authorization for surgery was initiated today. Surgery will consist of DIPJ arthroplasty with derotational skin plasty fifth digit left foot 4. The patient has had double hip  replacements and will require postoperative antibiotics  5. Return to clinic 1 week postop    Edrick Kins, DPM [facility]  Dr. Edrick Kins, DPM    [address], [place] [address]                Office [phone]  Fax [phone]

## 2017-06-30 ENCOUNTER — Telehealth: Payer: Self-pay

## 2017-06-30 ENCOUNTER — Other Ambulatory Visit: Payer: Self-pay | Admitting: Physician Assistant

## 2017-06-30 DIAGNOSIS — G43809 Other migraine, not intractable, without status migrainosus: Secondary | ICD-10-CM

## 2017-06-30 NOTE — Telephone Encounter (Signed)
Spoke with pharmacy and confirmed that Pt has 11 refills for Imitrex available. Also, left a message with Pt to notify her of her refills.

## 2017-07-07 ENCOUNTER — Telehealth: Payer: Self-pay | Admitting: Physician Assistant

## 2017-07-07 NOTE — Telephone Encounter (Signed)
Fax from [employer] that the patient has indicated that she does not wish to complete the screening due to an insurance issue.

## 2017-08-25 DIAGNOSIS — Z85828 Personal history of other malignant neoplasm of skin: Secondary | ICD-10-CM | POA: Diagnosis not present

## 2017-08-25 DIAGNOSIS — D2262 Melanocytic nevi of left upper limb, including shoulder: Secondary | ICD-10-CM | POA: Diagnosis not present

## 2017-08-25 DIAGNOSIS — Z85820 Personal history of malignant melanoma of skin: Secondary | ICD-10-CM | POA: Diagnosis not present

## 2017-08-25 DIAGNOSIS — D2261 Melanocytic nevi of right upper limb, including shoulder: Secondary | ICD-10-CM | POA: Diagnosis not present

## 2017-08-25 DIAGNOSIS — C44519 Basal cell carcinoma of skin of other part of trunk: Secondary | ICD-10-CM | POA: Diagnosis not present

## 2017-08-30 DIAGNOSIS — C44519 Basal cell carcinoma of skin of other part of trunk: Secondary | ICD-10-CM | POA: Diagnosis not present

## 2017-09-25 DIAGNOSIS — Z23 Encounter for immunization: Secondary | ICD-10-CM | POA: Diagnosis not present

## 2017-10-03 DIAGNOSIS — M810 Age-related osteoporosis without current pathological fracture: Secondary | ICD-10-CM | POA: Diagnosis not present

## 2017-10-03 DIAGNOSIS — M8589 Other specified disorders of bone density and structure, multiple sites: Secondary | ICD-10-CM | POA: Diagnosis not present

## 2017-10-14 ENCOUNTER — Telehealth: Payer: Self-pay | Admitting: Physician Assistant

## 2017-10-14 ENCOUNTER — Encounter: Payer: Self-pay | Admitting: Physician Assistant

## 2017-10-14 DIAGNOSIS — M810 Age-related osteoporosis without current pathological fracture: Secondary | ICD-10-CM

## 2017-10-14 MED ORDER — ALENDRONATE SODIUM 70 MG PO TABS: 70.0000 mg | ORAL_TABLET | ORAL | 3 refills | Status: DC

## 2017-10-14 NOTE — Telephone Encounter (Signed)
DEXA results. Need for bisphosphonate.  Meds ordered this encounter  Medications  . alendronate (FOSAMAX) 70 MG tablet    Sig: Take 1 tablet (70 mg total) by mouth every 7 (seven) days. Take with a full glass of water on an empty stomach.    Dispense:  12 tablet    Refill:  3    Order Specific Question:   Supervising Provider    Answer:   Brigitte Pulse, EVA N [[id number]]

## 2017-10-14 NOTE — Progress Notes (Signed)
Anti-resorptive medication recommended

## 2017-11-10 DIAGNOSIS — Z471 Aftercare following joint replacement surgery: Secondary | ICD-10-CM | POA: Diagnosis not present

## 2017-11-10 DIAGNOSIS — Z96643 Presence of artificial hip joint, bilateral: Secondary | ICD-10-CM | POA: Diagnosis not present

## 2017-11-18 DIAGNOSIS — Z6820 Body mass index (BMI) 20.0-20.9, adult: Secondary | ICD-10-CM | POA: Diagnosis not present

## 2017-11-18 DIAGNOSIS — Z01419 Encounter for gynecological examination (general) (routine) without abnormal findings: Secondary | ICD-10-CM | POA: Diagnosis not present

## 2017-11-28 DIAGNOSIS — L570 Actinic keratosis: Secondary | ICD-10-CM | POA: Diagnosis not present

## 2017-11-28 DIAGNOSIS — D2262 Melanocytic nevi of left upper limb, including shoulder: Secondary | ICD-10-CM | POA: Diagnosis not present

## 2017-11-28 DIAGNOSIS — Z85820 Personal history of malignant melanoma of skin: Secondary | ICD-10-CM | POA: Diagnosis not present

## 2017-11-28 DIAGNOSIS — Z85828 Personal history of other malignant neoplasm of skin: Secondary | ICD-10-CM | POA: Diagnosis not present

## 2017-11-28 DIAGNOSIS — D2261 Melanocytic nevi of right upper limb, including shoulder: Secondary | ICD-10-CM | POA: Diagnosis not present

## 2018-01-09 DIAGNOSIS — R7989 Other specified abnormal findings of blood chemistry: Secondary | ICD-10-CM | POA: Diagnosis not present

## 2018-01-09 DIAGNOSIS — M810 Age-related osteoporosis without current pathological fracture: Secondary | ICD-10-CM | POA: Diagnosis not present

## 2018-01-31 DIAGNOSIS — M84361A Stress fracture, right tibia, initial encounter for fracture: Secondary | ICD-10-CM | POA: Diagnosis not present

## 2018-01-31 DIAGNOSIS — M79604 Pain in right leg: Secondary | ICD-10-CM | POA: Diagnosis not present

## 2018-02-07 DIAGNOSIS — M79604 Pain in right leg: Secondary | ICD-10-CM | POA: Diagnosis not present

## 2018-02-20 DIAGNOSIS — S82209A Unspecified fracture of shaft of unspecified tibia, initial encounter for closed fracture: Secondary | ICD-10-CM | POA: Insufficient documentation

## 2018-02-20 DIAGNOSIS — S82201D Unspecified fracture of shaft of right tibia, subsequent encounter for closed fracture with routine healing: Secondary | ICD-10-CM | POA: Diagnosis not present

## 2018-02-27 ENCOUNTER — Other Ambulatory Visit: Payer: Self-pay | Admitting: Internal Medicine

## 2018-02-27 DIAGNOSIS — R112 Nausea with vomiting, unspecified: Secondary | ICD-10-CM | POA: Diagnosis not present

## 2018-02-27 DIAGNOSIS — R10811 Right upper quadrant abdominal tenderness: Secondary | ICD-10-CM | POA: Diagnosis not present

## 2018-02-27 DIAGNOSIS — S92901A Unspecified fracture of right foot, initial encounter for closed fracture: Secondary | ICD-10-CM | POA: Diagnosis not present

## 2018-02-27 DIAGNOSIS — E0590 Thyrotoxicosis, unspecified without thyrotoxic crisis or storm: Secondary | ICD-10-CM | POA: Diagnosis not present

## 2018-03-01 ENCOUNTER — Inpatient Hospital Stay (HOSPITAL_COMMUNITY)
Admission: EM | Admit: 2018-03-01 | Discharge: 2018-03-04 | DRG: 378 | Disposition: A | Discharge status: Home / Self Care | Payer: BLUE CROSS/BLUE SHIELD | Attending: Family Medicine | Admitting: Family Medicine

## 2018-03-01 ENCOUNTER — Encounter (HOSPITAL_COMMUNITY): Payer: Self-pay | Admitting: Family Medicine

## 2018-03-01 ENCOUNTER — Other Ambulatory Visit: Payer: Self-pay | Admitting: Internal Medicine

## 2018-03-01 ENCOUNTER — Ambulatory Visit
Admission: RE | Admit: 2018-03-01 | Discharge: 2018-03-01 | Disposition: A | Discharge status: Home / Self Care | Payer: BLUE CROSS/BLUE SHIELD | Source: Ambulatory Visit | Attending: Internal Medicine | Admitting: Internal Medicine

## 2018-03-01 DIAGNOSIS — K222 Esophageal obstruction: Secondary | ICD-10-CM | POA: Diagnosis not present

## 2018-03-01 DIAGNOSIS — K920 Hematemesis: Secondary | ICD-10-CM | POA: Diagnosis not present

## 2018-03-01 DIAGNOSIS — R710 Precipitous drop in hematocrit: Secondary | ICD-10-CM | POA: Diagnosis not present

## 2018-03-01 DIAGNOSIS — E039 Hypothyroidism, unspecified: Secondary | ICD-10-CM | POA: Diagnosis not present

## 2018-03-01 DIAGNOSIS — R634 Abnormal weight loss: Secondary | ICD-10-CM | POA: Diagnosis not present

## 2018-03-01 DIAGNOSIS — K315 Obstruction of duodenum: Secondary | ICD-10-CM

## 2018-03-01 DIAGNOSIS — G43709 Chronic migraine without aura, not intractable, without status migrainosus: Secondary | ICD-10-CM | POA: Diagnosis not present

## 2018-03-01 DIAGNOSIS — M810 Age-related osteoporosis without current pathological fracture: Secondary | ICD-10-CM | POA: Diagnosis not present

## 2018-03-01 DIAGNOSIS — M797 Fibromyalgia: Secondary | ICD-10-CM | POA: Diagnosis present

## 2018-03-01 DIAGNOSIS — R933 Abnormal findings on diagnostic imaging of other parts of digestive tract: Secondary | ICD-10-CM | POA: Diagnosis not present

## 2018-03-01 DIAGNOSIS — R112 Nausea with vomiting, unspecified: Secondary | ICD-10-CM | POA: Diagnosis not present

## 2018-03-01 DIAGNOSIS — Z791 Long term (current) use of non-steroidal anti-inflammatories (NSAID): Secondary | ICD-10-CM | POA: Diagnosis not present

## 2018-03-01 DIAGNOSIS — T39395A Adverse effect of other nonsteroidal anti-inflammatory drugs [NSAID], initial encounter: Secondary | ICD-10-CM | POA: Diagnosis not present

## 2018-03-01 DIAGNOSIS — Z87891 Personal history of nicotine dependence: Secondary | ICD-10-CM

## 2018-03-01 DIAGNOSIS — D62 Acute posthemorrhagic anemia: Secondary | ICD-10-CM | POA: Diagnosis not present

## 2018-03-01 DIAGNOSIS — R10811 Right upper quadrant abdominal tenderness: Secondary | ICD-10-CM

## 2018-03-01 DIAGNOSIS — R1011 Right upper quadrant pain: Secondary | ICD-10-CM | POA: Diagnosis not present

## 2018-03-01 DIAGNOSIS — Z96643 Presence of artificial hip joint, bilateral: Secondary | ICD-10-CM | POA: Diagnosis not present

## 2018-03-01 DIAGNOSIS — Z90710 Acquired absence of both cervix and uterus: Secondary | ICD-10-CM

## 2018-03-01 DIAGNOSIS — I10 Essential (primary) hypertension: Secondary | ICD-10-CM | POA: Diagnosis present

## 2018-03-01 DIAGNOSIS — R1110 Vomiting, unspecified: Secondary | ICD-10-CM | POA: Diagnosis not present

## 2018-03-01 DIAGNOSIS — K922 Gastrointestinal hemorrhage, unspecified: Secondary | ICD-10-CM | POA: Diagnosis not present

## 2018-03-01 DIAGNOSIS — E0590 Thyrotoxicosis, unspecified without thyrotoxic crisis or storm: Secondary | ICD-10-CM | POA: Diagnosis present

## 2018-03-01 DIAGNOSIS — R195 Other fecal abnormalities: Secondary | ICD-10-CM | POA: Diagnosis not present

## 2018-03-01 DIAGNOSIS — K2970 Gastritis, unspecified, without bleeding: Secondary | ICD-10-CM | POA: Diagnosis not present

## 2018-03-01 DIAGNOSIS — E0500 Thyrotoxicosis with diffuse goiter without thyrotoxic crisis or storm: Secondary | ICD-10-CM | POA: Diagnosis present

## 2018-03-01 DIAGNOSIS — K2951 Unspecified chronic gastritis with bleeding: Secondary | ICD-10-CM | POA: Diagnosis not present

## 2018-03-01 DIAGNOSIS — K259 Gastric ulcer, unspecified as acute or chronic, without hemorrhage or perforation: Secondary | ICD-10-CM | POA: Diagnosis not present

## 2018-03-01 DIAGNOSIS — E876 Hypokalemia: Secondary | ICD-10-CM | POA: Diagnosis not present

## 2018-03-01 DIAGNOSIS — Z85828 Personal history of other malignant neoplasm of skin: Secondary | ICD-10-CM

## 2018-03-01 DIAGNOSIS — K254 Chronic or unspecified gastric ulcer with hemorrhage: Secondary | ICD-10-CM | POA: Diagnosis not present

## 2018-03-01 DIAGNOSIS — G43909 Migraine, unspecified, not intractable, without status migrainosus: Secondary | ICD-10-CM | POA: Diagnosis present

## 2018-03-01 LAB — POC OCCULT BLOOD, ED: FECAL OCCULT BLD: POSITIVE — AB

## 2018-03-01 MED ORDER — SODIUM CHLORIDE 0.9 % IV BOLUS (SEPSIS)
1000.0000 mL | Freq: Once | INTRAVENOUS | Status: AC
  Administered 2018-03-01: 1000 mL via INTRAVENOUS

## 2018-03-01 MED ORDER — PANTOPRAZOLE SODIUM 40 MG IV SOLR
40.0000 mg | Freq: Once | INTRAVENOUS | Status: AC
  Administered 2018-03-01: 40 mg via INTRAVENOUS
  Filled 2018-03-01: qty 40

## 2018-03-01 MED ORDER — ONDANSETRON HCL 4 MG/2ML IJ SOLN
4.0000 mg | Freq: Once | INTRAMUSCULAR | Status: AC
  Administered 2018-03-01: 4 mg via INTRAVENOUS
  Filled 2018-03-01: qty 2

## 2018-03-01 MED ORDER — IOPAMIDOL (ISOVUE-300) INJECTION 61%
INTRAVENOUS | Status: AC
  Administered 2018-03-01: 30 mL via ORAL
  Filled 2018-03-01: qty 30

## 2018-03-01 NOTE — ED Triage Notes (Signed)
Patient was seen on Monday by Dr. Maia Petties for abd pain, nausea, vomiting with "blood in vomit". Patient reports she was sent for an x-ray this morning and referred for further treatment. Patient reports Dr. Maia Petties sent over information about patient. From Dr. Caroline More note, patient is experiencing right upper quadrant tenderness with nausea and vomiting. Also, patient reports she is scheduled for an ultrasound. Patient appears in no acute distress.

## 2018-03-01 NOTE — ED Notes (Signed)
Patient would like to see about the blood work that was performed on Monday at her PCP office before obtaining more.

## 2018-03-01 NOTE — ED Provider Notes (Signed)
[facility] DEPT Provider Note   CSN: [id number] Arrival date & time: 03/01/18  1953     History   Chief Complaint Chief Complaint  Patient presents with  . Hematemesis    HPI Carol King is a 67 y.o. female.  The history is provided by the patient.  She has history of hypertension and hyperthyroidism and comes in with nausea and vomiting for the last 11 days.  During this time, she has not been able to hold anything down.  She did have a stool that looked like it was dark blood this morning, and she vomited blood tonight.  The blood that she vomited tonight was maroon.  This is the first time that she is vomited blood or had blood in the stool during this illness.  She denies any abdominal pain but does state there is some mild tenderness.  She initially had low-grade fever to 99 without chills or sweats.  She was recently diagnosed with hyperthyroidism but has not started on treatment.  She had not been losing weight before this episode, but has lost about 10 pounds in the 11 days that she has been sick.  She did see her PCP 2 days ago and had blood work drawn, and had x-rays done this morning.  She was referred here by her PCP for further evaluation.  She denies feeling lightheaded or dizzy.  She is not taking any NSAIDs.  She has not taken anything for nausea at home.  Of note, she does have a short leg cast on her right leg because of a tibia fracture.  Past Medical History:  Diagnosis Date  . Arthritis   . Complication of anesthesia   . Difficulty sleeping    DUE TO HIP PAIN  . Headache    MIGRAINES  . History of skin cancer   . Hypertension   . PONV (postoperative nausea and vomiting)    SEVERAL YRS AGO - NONE WITH MORE RECENT SURGERIES  . Skin cancer    BCC, SCC    Patient Active Problem List   Diagnosis Date Noted  . Osteoporosis 10/14/2017  . History of basal cell carcinoma (BCC) 06/10/2017  . 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  Fibromyalgia 01/10/2014  . Post-menopausal 01/10/2014  . Migraine 04/19/2013  . Hyperthyroidism 04/19/2013    Past Surgical History:  Procedure Laterality Date  . ABDOMINAL HYSTERECTOMY  1981   one ovary remains  . APPENDECTOMY    . SKIN CANCER EXCISION    . TONSILLECTOMY AND ADENOIDECTOMY    . TOTAL HIP ARTHROPLASTY Right 07/11/2011   HIP; [facility]  . TOTAL HIP ARTHROPLASTY Left 05/06/2015   Procedure: LEFT TOTAL HIP ARTHROPLASTY ANTERIOR APPROACH;  Surgeon: Paralee Cancel, MD;  Location: [facility] ORS;  Service: Orthopedics;  Laterality: Left;    OB History    No data available       Home Medications    Prior to Admission medications   Medication Sig Start Date End Date Taking? Authorizing Provider  alendronate (FOSAMAX) 70 MG tablet Take 1 tablet (70 mg total) by mouth every 7 (seven) days. Take with a full glass of water on an empty stomach. 10/14/17  Yes Jeffery, Chelle, PA-C  Ascorbic Acid (VITAMIN C PO) Take 1 tablet by mouth every morning. Time Release - Emiliano Dyer.   Yes [provider]  CALCIUM PO Take 2 tablets by mouth every morning.   Yes [provider]  loratadine (CLARITIN) 10 MG tablet Take 10  mg by mouth every morning.   Yes [provider]  Magnesium 250 MG TABS Take 500 mg by mouth every morning. [employer].   Yes [provider]  Multiple Vitamin (MULTIVITAMIN WITH MINERALS) TABS tablet Take 2 tablets by mouth every morning.   Yes [provider]  OVER THE COUNTER MEDICATION Take 2 tablets by mouth every morning. [place].   Yes [provider]  OVER THE COUNTER MEDICATION Take 2 tablets by mouth every morning. Skeletal Strength- Natural Sunshine.   Yes [provider]  SUMAtriptan (IMITREX) 100 MG tablet Take 1 as needed for migraineMay repeat in 2 hours if headache persists or recurs. 06/10/17  Yes Jeffery, Chelle, PA-C  acetaminophen (TYLENOL) 325 MG tablet Take 2 tablets  (650 mg total) by mouth every 6 (six) hours as needed for mild pain (or Fever >/= 101). Patient not taking: Reported on 11/23/2016 05/06/15   Danae Orleans, PA-C    Family History Family History  Problem Relation Age of Onset  . Cancer Mother 16       breast cancer  . Cancer Maternal Grandmother        breast cancer  . Kidney disease Maternal Grandfather   . Arthritis Paternal Grandmother     Social History Social History   Tobacco Use  . Smoking status: Former Smoker    Last attempt to quit: 04/30/1994    Years since quitting: 23.8  . Smokeless tobacco: Former Systems developer    Quit date: 03/09/1993  Substance Use Topics  . Alcohol use: No    Alcohol/week: 0.0 oz  . Drug use: No     Allergies   Darvon [propoxyphene hcl] and Vicodin [hydrocodone-acetaminophen]   Review of Systems Review of Systems  All other systems reviewed and are negative.    Physical Exam Updated Vital Signs BP (!) 154/86 (BP Location: Left Arm)   Pulse 100   Temp 98 F (36.7 C) (Oral)   Resp 18   Ht 5\' 3"  (1.6 m)   Wt 47.2 kg (104 lb)   SpO2 100%   BMI 18.42 kg/m   Physical Exam  Nursing note and vitals reviewed.  Very thin 67 year old female, resting comfortably and in no acute distress. Vital signs are significant for elevated systolic blood pressure. Oxygen saturation is 100%, which is normal. Head is normocephalic and atraumatic. PERRLA, EOMI. Oropharynx is clear. Neck is nontender and supple without adenopathy or JVD. Back is nontender and there is no CVA tenderness. Lungs are clear without rales, wheezes, or rhonchi. Chest is nontender. Heart has regular rate and rhythm without murmur. Abdomen is soft, flat, with minimal tenderness along the midline in both in the upper and lower abdomen.  There is no rebound or guarding.  There are no masses or hepatosplenomegaly and peristalsis is hypoactive.  Aortic bruit is heard in the epigastric area. Rectal: Black stool present which is  Hemoccult positive. Extremities have no cyanosis or edema.  Short leg cast is present on the right leg. Skin is warm and dry without rash. Neurologic: Mental status is normal, cranial nerves are intact, there are no motor or sensory deficits.  ED Treatments / Results  Labs (all labs ordered are listed, but only abnormal results are displayed) Labs Reviewed  COMPREHENSIVE METABOLIC PANEL - Abnormal; Notable for the following components:      Result Value   Potassium 3.0 (*)    Chloride 90 (*)    CO2 33 (*)  Glucose, Bld 113 (*)    BUN 33 (*)    Albumin 3.3 (*)    Anion gap 18 (*)    All other components within normal limits  CBC WITH DIFFERENTIAL/PLATELET - Abnormal; Notable for the following components:   RBC 6.18 (*)    Hemoglobin 17.9 (*)    HCT 51.2 (*)    All other components within normal limits  HEMOGLOBIN AND HEMATOCRIT, BLOOD - Abnormal; Notable for the following components:   Hemoglobin 9.2 (*)    HCT 27.4 (*)    All other components within normal limits  POC OCCULT BLOOD, ED - Abnormal; Notable for the following components:   Fecal Occult Bld POSITIVE (*)    All other components within normal limits  LIPASE, BLOOD  POC OCCULT BLOOD, ED  TYPE AND SCREEN   Radiology Ct Abdomen Pelvis W Contrast  Result Date: 03/02/2018 CLINICAL DATA:  Hematemesis.  Nausea and vomiting. EXAM: CT ABDOMEN AND PELVIS WITH CONTRAST TECHNIQUE: Multidetector CT imaging of the abdomen and pelvis was performed using the standard protocol following bolus administration of intravenous contrast. CONTRAST:  80 cc Isovue-300 IV COMPARISON:  Radiograph earlier this day. FINDINGS: Lower chest: Linear atelectasis/scarring in both lower lobes. No consolidation or pleural fluid. Hepatobiliary: Scattered tiny hepatic hypodensities are too small to characterize, may be small cysts, hamartomas or hemangiomas. Calcified gallstone within minimally distended gallbladder. No pericholecystic inflammation. No  biliary dilatation. Pancreas: Parenchymal atrophy. No ductal dilatation or inflammation. Spleen: Normal in size without focal abnormality. Adrenals/Urinary Tract: Normal adrenal glands. No hydronephrosis or perinephric edema. Homogeneous renal enhancement with symmetric excretion on delayed phase imaging. Urinary bladder is physiologically distended, detailed evaluation obscured by streak artifact from bilateral hip arthroplasties. Stomach/Bowel: Mild-to-moderate gastric wall thickening about the cardia. There is moderate near circumferential wall thickening about the gastric body. No definite perigastric inflammation. 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 No other bowel inflammation or wall thickening. No obstruction. Streak artifact from bilateral hip arthroplasties partially limits evaluation of pelvic bowel loops. Vascular/Lymphatic: Mild aortic atherosclerosis. Incidental circumaortic left renal vein. No enlarged abdominal or pelvic lymph nodes, allowing for streak artifact in the pelvis. Reproductive: Post hysterectomy. No gross adnexal mass allowing for streak artifact. Other: No ascites or free air.  No intra-abdominal abscess. Musculoskeletal: Bilateral hip arthroplasties. There are no acute or suspicious osseous abnormalities. IMPRESSION: 1. Near circumferential gastric wall thickening about the body, differential considerations include gastritis, peptic ulcer disease, or gastric mass. Recommend correlation with endoscopy. There is also wall thickening about the gastric cardia. 2. Gallstone without gallbladder inflammation. 3. Tiny hepatic hypodensities which are too small to accurately characterize. 4.  Aortic Atherosclerosis (ICD10-I70.0). Electronically Signed   By: Jeb Levering M.D.   On: 03/02/2018 02:27   Dg Abd 2 Views  Result Date: 03/01/2018 CLINICAL DATA:  Right upper quadrant  and epigastric tenderness and pain for over week, nausea and vomiting EXAM: ABDOMEN - 2 VIEW COMPARISON:  None. FINDINGS: Supine and erect views the abdomen show no bowel obstruction. No free air is seen on the erect view. Of rounded metallic opacity in the midline of the lower chest on the initial view represents the patient's necklace which was moved on the second view. No opaque calculi are seen. 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 There are degenerative changes at the L5-S1 level. IMPRESSION: 1. No bowel obstruction.  No free air. 2. Bilateral total hip replacements. Electronically  Signed   By: Ivar Drape M.D.   On: 03/01/2018 15:07    Procedures Procedures  Medications Ordered in ED Medications  sodium chloride 0.9 % bolus 1,000 mL (not administered)  ondansetron (ZOFRAN) injection 4 mg (not administered)  pantoprazole (PROTONIX) injection 40 mg (not administered)     Initial Impression / Assessment and Plan / ED Course  I have reviewed the triage vital signs and the nursing notes.  Pertinent labs & imaging results that were available during my care of the patient were reviewed by me and considered in my medical decision making (see chart for details).  Nausea, vomiting with GI bleeding.  Old records are reviewed, and she has no relevant past visits.  Laboratory work sent by patient's PCP was drawn on March 18 at which time hemoglobin was 11.5, creatinine 0.63, BUN 34 suggesting possible upper GI bleed.  Thyroid tests showed elevated T4 and low TSH consistent with untreated hyperthyroidism.  Abdominal x-rays from this morning were unremarkable.  Will recheck labs and send for CT of abdomen and pelvis.  Blood drawn for type and screen.  She is started on intravenous pantoprazole.  Initial hemoglobin has come back at 17.9.  This is clearly a lab error and hemoglobin is repeated and has come back at 9.2.  This is much more consistent with her picture and recent hemoglobin as an  outpatient.  CT scan shows thickening of the gastric wall which spread could be gastritis.  However, I am certainly concerned about possibility of malignancy.  She has remained hemodynamically stable in the ED.  Also noted was hypokalemia and she is given oral potassium.  Case is discussed with Dr. Alcario Drought of [facility] who agrees to admit the patient.  Final Clinical Impressions(s) / ED Diagnoses   Final diagnoses:  Upper gastrointestinal bleeding  Non-intractable vomiting with nausea, unspecified vomiting type  Hypokalemia    ED Discharge Orders    None       Delora Fuel, MD 45/80/99 [phone]

## 2018-03-02 ENCOUNTER — Emergency Department (HOSPITAL_COMMUNITY): Payer: BLUE CROSS/BLUE SHIELD

## 2018-03-02 ENCOUNTER — Inpatient Hospital Stay (HOSPITAL_COMMUNITY): Payer: BLUE CROSS/BLUE SHIELD

## 2018-03-02 ENCOUNTER — Inpatient Hospital Stay (HOSPITAL_COMMUNITY): Payer: BLUE CROSS/BLUE SHIELD | Admitting: Registered Nurse

## 2018-03-02 ENCOUNTER — Encounter (HOSPITAL_COMMUNITY): Payer: Self-pay | Admitting: Registered Nurse

## 2018-03-02 ENCOUNTER — Encounter (HOSPITAL_COMMUNITY)
Admission: EM | Disposition: A | Discharge status: Home / Self Care | Payer: Self-pay | Source: Home / Self Care | Attending: Family Medicine

## 2018-03-02 DIAGNOSIS — R710 Precipitous drop in hematocrit: Secondary | ICD-10-CM | POA: Diagnosis not present

## 2018-03-02 DIAGNOSIS — Z96643 Presence of artificial hip joint, bilateral: Secondary | ICD-10-CM | POA: Diagnosis present

## 2018-03-02 DIAGNOSIS — K254 Chronic or unspecified gastric ulcer with hemorrhage: Secondary | ICD-10-CM | POA: Diagnosis present

## 2018-03-02 DIAGNOSIS — K922 Gastrointestinal hemorrhage, unspecified: Secondary | ICD-10-CM | POA: Diagnosis not present

## 2018-03-02 DIAGNOSIS — K920 Hematemesis: Secondary | ICD-10-CM

## 2018-03-02 DIAGNOSIS — E0590 Thyrotoxicosis, unspecified without thyrotoxic crisis or storm: Secondary | ICD-10-CM | POA: Diagnosis present

## 2018-03-02 DIAGNOSIS — M810 Age-related osteoporosis without current pathological fracture: Secondary | ICD-10-CM

## 2018-03-02 DIAGNOSIS — R634 Abnormal weight loss: Secondary | ICD-10-CM | POA: Diagnosis not present

## 2018-03-02 DIAGNOSIS — R112 Nausea with vomiting, unspecified: Secondary | ICD-10-CM

## 2018-03-02 DIAGNOSIS — Z90710 Acquired absence of both cervix and uterus: Secondary | ICD-10-CM | POA: Diagnosis not present

## 2018-03-02 DIAGNOSIS — I10 Essential (primary) hypertension: Secondary | ICD-10-CM | POA: Diagnosis present

## 2018-03-02 DIAGNOSIS — Z8719 Personal history of other diseases of the digestive system: Secondary | ICD-10-CM | POA: Insufficient documentation

## 2018-03-02 DIAGNOSIS — R933 Abnormal findings on diagnostic imaging of other parts of digestive tract: Secondary | ICD-10-CM

## 2018-03-02 DIAGNOSIS — T39395A Adverse effect of other nonsteroidal anti-inflammatory drugs [NSAID], initial encounter: Secondary | ICD-10-CM | POA: Diagnosis present

## 2018-03-02 DIAGNOSIS — K2970 Gastritis, unspecified, without bleeding: Secondary | ICD-10-CM

## 2018-03-02 DIAGNOSIS — M797 Fibromyalgia: Secondary | ICD-10-CM | POA: Diagnosis present

## 2018-03-02 DIAGNOSIS — R195 Other fecal abnormalities: Secondary | ICD-10-CM

## 2018-03-02 DIAGNOSIS — E876 Hypokalemia: Secondary | ICD-10-CM

## 2018-03-02 DIAGNOSIS — Z85828 Personal history of other malignant neoplasm of skin: Secondary | ICD-10-CM | POA: Diagnosis not present

## 2018-03-02 DIAGNOSIS — K315 Obstruction of duodenum: Secondary | ICD-10-CM | POA: Diagnosis present

## 2018-03-02 DIAGNOSIS — K222 Esophageal obstruction: Secondary | ICD-10-CM | POA: Diagnosis present

## 2018-03-02 DIAGNOSIS — D62 Acute posthemorrhagic anemia: Secondary | ICD-10-CM | POA: Diagnosis present

## 2018-03-02 DIAGNOSIS — G43709 Chronic migraine without aura, not intractable, without status migrainosus: Secondary | ICD-10-CM

## 2018-03-02 DIAGNOSIS — Z87891 Personal history of nicotine dependence: Secondary | ICD-10-CM | POA: Diagnosis not present

## 2018-03-02 DIAGNOSIS — Z791 Long term (current) use of non-steroidal anti-inflammatories (NSAID): Secondary | ICD-10-CM

## 2018-03-02 HISTORY — PX: ESOPHAGOGASTRODUODENOSCOPY (EGD) WITH PROPOFOL: SHX5813

## 2018-03-02 LAB — HEMOGLOBIN AND HEMATOCRIT, BLOOD
HEMATOCRIT: 27.4 % — AB (ref 36.0–46.0)
Hemoglobin: 9.2 g/dL — ABNORMAL LOW (ref 12.0–15.0)

## 2018-03-02 LAB — COMPREHENSIVE METABOLIC PANEL
ALBUMIN: 3.3 g/dL — AB (ref 3.5–5.0)
ALT: 25 U/L (ref 14–54)
ANION GAP: 18 — AB (ref 5–15)
AST: 21 U/L (ref 15–41)
Alkaline Phosphatase: 84 U/L (ref 38–126)
BUN: 33 mg/dL — ABNORMAL HIGH (ref 6–20)
CALCIUM: 9.2 mg/dL (ref 8.9–10.3)
CO2: 33 mmol/L — AB (ref 22–32)
Chloride: 90 mmol/L — ABNORMAL LOW (ref 101–111)
Creatinine, Ser: 0.8 mg/dL (ref 0.44–1.00)
GFR calc non Af Amer: >60 mL/min (ref >60)
GLUCOSE: 113 mg/dL — AB (ref 65–99)
POTASSIUM: 3 mmol/L — AB (ref 3.5–5.1)
SODIUM: 141 mmol/L (ref 135–145)
Total Bilirubin: 0.8 mg/dL (ref 0.3–1.2)
Total Protein: 6.5 g/dL (ref 6.5–8.1)

## 2018-03-02 LAB — CBC WITH DIFFERENTIAL/PLATELET
BASOS ABS: 0 10*3/uL (ref 0.0–0.1)
BASOS PCT: 0 %
EOS ABS: 0 10*3/uL (ref 0.0–0.7)
Eosinophils Relative: 0 %
HEMATOCRIT: 51.2 % — AB (ref 36.0–46.0)
HEMOGLOBIN: 17.9 g/dL — AB (ref 12.0–15.0)
Lymphocytes Relative: 38 %
Lymphs Abs: 2 10*3/uL (ref 0.7–4.0)
MCH: 29 pg (ref 26.0–34.0)
MCHC: 35 g/dL (ref 30.0–36.0)
MCV: 82.8 fL (ref 78.0–100.0)
MONO ABS: 0.4 10*3/uL (ref 0.1–1.0)
MONOS PCT: 8 %
NEUTROS ABS: 2.9 10*3/uL (ref 1.7–7.7)
NEUTROS PCT: 54 %
Platelets: 237 10*3/uL (ref 150–400)
RBC: 6.18 MIL/uL — ABNORMAL HIGH (ref 3.87–5.11)
RDW: 13.5 % (ref 11.5–15.5)
WBC: 5.4 10*3/uL (ref 4.0–10.5)

## 2018-03-02 LAB — GLUCOSE, CAPILLARY
Glucose-Capillary: 115 mg/dL — ABNORMAL HIGH (ref 65–99)
Glucose-Capillary: 120 mg/dL — ABNORMAL HIGH (ref 65–99)
Glucose-Capillary: 62 mg/dL — ABNORMAL LOW (ref 65–99)
Glucose-Capillary: 76 mg/dL (ref 65–99)

## 2018-03-02 LAB — VITAMIN B12: Vitamin B-12: 4799 pg/mL — ABNORMAL HIGH (ref 180–914)

## 2018-03-02 LAB — TYPE AND SCREEN
ABO/RH(D): O POS
ANTIBODY SCREEN: NEGATIVE

## 2018-03-02 LAB — IRON AND TIBC
IRON: 20 ug/dL — AB (ref 28–170)
SATURATION RATIOS: 8 % — AB (ref 10.4–31.8)
TIBC: 252 ug/dL (ref 250–450)
UIBC: 232 ug/dL

## 2018-03-02 LAB — FERRITIN: Ferritin: 33 ng/mL (ref 11–307)

## 2018-03-02 LAB — LIPASE, BLOOD: LIPASE: 33 U/L (ref 11–51)

## 2018-03-02 SURGERY — ESOPHAGOGASTRODUODENOSCOPY (EGD) WITH PROPOFOL
Anesthesia: Monitor Anesthesia Care

## 2018-03-02 MED ORDER — POTASSIUM CHLORIDE 10 MEQ/100ML IV SOLN: 10.0000 meq | INTRAVENOUS | Status: DC

## 2018-03-02 MED ORDER — ONDANSETRON HCL 4 MG/2ML IJ SOLN
4.0000 mg | Freq: Once | INTRAMUSCULAR | Status: AC
  Administered 2018-03-02: 4 mg via INTRAVENOUS
  Filled 2018-03-02: qty 2

## 2018-03-02 MED ORDER — ONDANSETRON HCL 4 MG/2ML IJ SOLN
4.0000 mg | Freq: Four times a day (QID) | INTRAMUSCULAR | Status: AC
  Administered 2018-03-02 – 2018-03-03 (×6): 4 mg via INTRAVENOUS
  Filled 2018-03-02 (×6): qty 2

## 2018-03-02 MED ORDER — LIDOCAINE 2% (20 MG/ML) 5 ML SYRINGE
INTRAMUSCULAR | Status: DC | PRN
  Administered 2018-03-02: 40 mg via INTRAVENOUS

## 2018-03-02 MED ORDER — ONDANSETRON HCL 4 MG/2ML IJ SOLN: 4.0000 mg | Freq: Four times a day (QID) | INTRAMUSCULAR | Status: DC | PRN

## 2018-03-02 MED ORDER — SODIUM CHLORIDE 0.9 % IJ SOLN
INTRAMUSCULAR | Status: AC
  Filled 2018-03-02: qty 50

## 2018-03-02 MED ORDER — IOPAMIDOL (ISOVUE-300) INJECTION 61%
100.0000 mL | Freq: Once | INTRAVENOUS | Status: DC | PRN
  Administered 2018-03-01: 30 mL via ORAL

## 2018-03-02 MED ORDER — PANTOPRAZOLE SODIUM 40 MG IV SOLR
40.0000 mg | Freq: Once | INTRAVENOUS | Status: AC
  Administered 2018-03-02: 40 mg via INTRAVENOUS
  Filled 2018-03-02: qty 40

## 2018-03-02 MED ORDER — PANTOPRAZOLE SODIUM 40 MG IV SOLR
40.0000 mg | Freq: Two times a day (BID) | INTRAVENOUS | Status: DC
  Administered 2018-03-02 – 2018-03-03 (×3): 40 mg via INTRAVENOUS
  Filled 2018-03-02 (×3): qty 40

## 2018-03-02 MED ORDER — SODIUM CHLORIDE 0.9 % IV SOLN
INTRAVENOUS | Status: DC
  Administered 2018-03-02: 08:00:00 via INTRAVENOUS

## 2018-03-02 MED ORDER — IOPAMIDOL (ISOVUE-300) INJECTION 61%
100.0000 mL | Freq: Once | INTRAVENOUS | Status: AC | PRN
Start: 2018-03-02 — End: 2018-03-02
  Administered 2018-03-02: 100 mL via ORAL

## 2018-03-02 MED ORDER — POTASSIUM CHLORIDE CRYS ER 20 MEQ PO TBCR
40.0000 meq | EXTENDED_RELEASE_TABLET | Freq: Once | ORAL | Status: AC
  Administered 2018-03-02: 40 meq via ORAL
  Filled 2018-03-02: qty 2

## 2018-03-02 MED ORDER — IOPAMIDOL (ISOVUE-300) INJECTION 61%
100.0000 mL | Freq: Once | INTRAVENOUS | Status: AC | PRN
  Administered 2018-03-02: 80 mL via INTRAVENOUS

## 2018-03-02 MED ORDER — IOPAMIDOL (ISOVUE-300) INJECTION 61%
INTRAVENOUS | Status: AC
  Filled 2018-03-02: qty 100

## 2018-03-02 MED ORDER — BOOST / RESOURCE BREEZE PO LIQD CUSTOM
1.0000 | Freq: Three times a day (TID) | ORAL | Status: DC
  Administered 2018-03-03 – 2018-03-04 (×2): 1 via ORAL

## 2018-03-02 MED ORDER — PROMETHAZINE HCL 25 MG PO TABS: 12.5000 mg | ORAL_TABLET | Freq: Four times a day (QID) | ORAL | Status: DC | PRN

## 2018-03-02 MED ORDER — INSULIN ASPART 100 UNIT/ML ~~LOC~~ SOLN: 0.0000 [IU] | Freq: Three times a day (TID) | SUBCUTANEOUS | Status: DC

## 2018-03-02 MED ORDER — PROPOFOL 500 MG/50ML IV EMUL
INTRAVENOUS | Status: DC | PRN
  Administered 2018-03-02: 150 ug/kg/min via INTRAVENOUS

## 2018-03-02 MED ORDER — OXYCODONE HCL 5 MG PO TABS
5.0000 mg | ORAL_TABLET | ORAL | Status: DC | PRN
Start: 2018-03-02 — End: 2018-03-04
  Administered 2018-03-02: 5 mg via ORAL
  Filled 2018-03-02: qty 1

## 2018-03-02 MED ORDER — PROPOFOL 10 MG/ML IV BOLUS
INTRAVENOUS | Status: AC
  Filled 2018-03-02: qty 40

## 2018-03-02 MED ORDER — PROPOFOL 10 MG/ML IV BOLUS
INTRAVENOUS | Status: DC | PRN
  Administered 2018-03-02 (×2): 30 mg via INTRAVENOUS

## 2018-03-02 SURGICAL SUPPLY — 15 items

## 2018-03-02 NOTE — Consult Note (Addendum)
Referring Provider: Dr. Reesa Chew Primary Care Physician:  Harrison Mons, PA-C Primary Gastroenterologist:  None, unassigned  Reason for Consultation:  UGIB, abnormal CT scan  HPI: Carol King is a 67 y.o. female with medical history significant of osteoporosis, hyperthyroidism, migraine, right tibial fracture with cast in place who presented to the hospital with complaints of hematemesis.  Patient states she started taking 4 advil 3-4 times daily for about 10 days max after suffering a tibial fracture.  Then on March 9 she started having feeling of intense nausea and 1-2 episodes of daily.  At that point she discontinued the Advil.  She denies absolutely any other complaints including abdominal pain.  Her only other issue is about a 10 pound weight loss over this time due to inability to eat and keep food down.  A couple of days ago she had a sticky black stool, then 2 days ago she started having episodes of hematemesis with scant blood.  Due to this she was seen by her PCP couple of days ago who performed routine lab and was found to have a hemoglobin around 11 grams.  When she started vomiting the blood she was told to come to the ED.  In the waiting room she vomited about a half cupful of marooned colored blood/emesis.  Denies any previous history of bleeding.  She has never had colonoscopy or endoscopy but states for some reason had flex sigmoidoscopy several years ago.  By personal choice she did not ever have a colonoscopy and does not want one unless absolutely necessary.  In the ED her initial hemoglobin was noted to be 17.9 grams but there was a lab error and repeat labs showed hemoglobin of 9.2 grams, which is a drop from 11 gram range just 2 days ago.  Her potassium was noted to be 3.0.  In the ER she was given 1 dose of Protonix and oral potassium.  Medical team was asked to admit and GI was consulted.  She also had a CT scan that showed the following:  IMPRESSION: 1. Near  circumferential gastric wall thickening about the body, differential considerations include gastritis, peptic ulcer disease, or gastric mass. Recommend correlation with endoscopy. There is also wall thickening about the gastric cardia. 2. Gallstone without gallbladder inflammation. 3. Tiny hepatic hypodensities which are too small to accurately characterize. 4.  Aortic Atherosclerosis (ICD10-I70.0).  Otherwise she has no complaints this AM.  Has been NPO.  Has been started on PPI IV BID.   Past Medical History:  Diagnosis Date  . Arthritis   . Complication of anesthesia   . Difficulty sleeping    DUE TO HIP PAIN  . Headache    MIGRAINES  . History of skin cancer   . Hypertension   . PONV (postoperative nausea and vomiting)    SEVERAL YRS AGO - NONE WITH MORE RECENT SURGERIES  . Skin cancer    BCC, SCC    Past Surgical History:  Procedure Laterality Date  . ABDOMINAL HYSTERECTOMY  1981   one ovary remains  . APPENDECTOMY    . SKIN CANCER EXCISION    . TONSILLECTOMY AND ADENOIDECTOMY    . TOTAL HIP ARTHROPLASTY Right 07/11/2011   HIP; [facility]  . TOTAL HIP ARTHROPLASTY Left 05/06/2015   Procedure: LEFT TOTAL HIP ARTHROPLASTY ANTERIOR APPROACH;  Surgeon: Paralee Cancel, MD;  Location: [facility];  Service: Orthopedics;  Laterality: Left;    Prior to Admission medications   Medication Sig Start Date End Date Taking? Authorizing  Provider  alendronate (FOSAMAX) 70 MG tablet Take 1 tablet (70 mg total) by mouth every 7 (seven) days. Take with a full glass of water on an empty stomach. 10/14/17  Yes Jeffery, Chelle, PA-C  Ascorbic Acid (VITAMIN C PO) Take 1 tablet by mouth every morning. Time Release - Emiliano Dyer.   Yes [provider]  CALCIUM PO Take 2 tablets by mouth every morning.   Yes [provider]  loratadine (CLARITIN) 10 MG tablet Take 10 mg by mouth every morning.   Yes [provider]  Magnesium 250 MG TABS Take 500 mg by mouth every morning.  [employer].   Yes [provider]  Multiple Vitamin (MULTIVITAMIN WITH MINERALS) TABS tablet Take 2 tablets by mouth every morning.   Yes [provider]  OVER THE COUNTER MEDICATION Take 2 tablets by mouth every morning. [place].   Yes [provider]  OVER THE COUNTER MEDICATION Take 2 tablets by mouth every morning. Skeletal Strength- Natural Sunshine.   Yes [provider]  SUMAtriptan (IMITREX) 100 MG tablet Take 1 as needed for migraineMay repeat in 2 hours if headache persists or recurs. 06/10/17  Yes Jeffery, Chelle, PA-C  acetaminophen (TYLENOL) 325 MG tablet Take 2 tablets (650 mg total) by mouth every 6 (six) hours as needed for mild pain (or Fever >/= 101). Patient not taking: Reported on 11/23/2016 05/06/15   Danae Orleans, PA-C    Current Facility-Administered Medications  Medication Dose Route Frequency Provider Last Rate Last Dose  . 0.9 %  sodium chloride infusion   Intravenous Continuous Amin, Ankit Chirag, MD 100 mL/hr at 03/02/18 0828    . insulin aspart (novoLOG) injection 0-9 Units  0-9 Units Subcutaneous TID WC Amin, Ankit Chirag, MD      . iopamidol (ISOVUE-300) 61 % injection           . ondansetron (ZOFRAN) injection 4 mg  4 mg Intravenous Q6H PRN Amin, Ankit Chirag, MD      . pantoprazole (PROTONIX) injection 40 mg  40 mg Intravenous Q12H Amin, Ankit Chirag, MD   40 mg at 03/02/18 0828  . promethazine (PHENERGAN) tablet 12.5 mg  12.5 mg Oral Q6H PRN Amin, Ankit Chirag, MD      . sodium chloride 0.9 % injection            Current Outpatient Medications  Medication Sig Dispense Refill  . alendronate (FOSAMAX) 70 MG tablet Take 1 tablet (70 mg total) by mouth every 7 (seven) days. Take with a full glass of water on an empty stomach. 12 tablet 3  . Ascorbic Acid (VITAMIN C PO) Take 1 tablet by mouth every morning. Time Release - Emiliano Dyer.    Marland Kitchen CALCIUM PO Take 2 tablets by mouth every morning.      . loratadine (CLARITIN) 10 MG tablet Take 10 mg by mouth every morning.    . Magnesium 250 MG TABS Take 500 mg by mouth every morning. [employer].    . Multiple Vitamin (MULTIVITAMIN WITH MINERALS) TABS tablet Take 2 tablets by mouth every morning.    Marland Kitchen OVER THE COUNTER MEDICATION Take 2 tablets by mouth every morning. [place].    Marland Kitchen OVER THE COUNTER MEDICATION Take 2 tablets by mouth every morning. Skeletal Strength- Natural Sunshine.    . SUMAtriptan (IMITREX) 100 MG tablet Take 1 as needed for migraineMay repeat in 2 hours if headache persists or recurs. 9 tablet 12  .  acetaminophen (TYLENOL) 325 MG tablet Take 2 tablets (650 mg total) by mouth every 6 (six) hours as needed for mild pain (or Fever >/= 101). (Patient not taking: Reported on 11/23/2016)      Allergies as of 03/01/2018 - Review Complete 03/01/2018  Allergen Reaction Noted  . Darvon [propoxyphene hcl]  04/10/2012  . Vicodin [hydrocodone-acetaminophen] Other (See Comments) 04/10/2012    Family History  Problem Relation Age of Onset  . Cancer Mother 38       breast cancer  . Cancer Maternal Grandmother        breast cancer  . Kidney disease Maternal Grandfather   . Arthritis Paternal Grandmother     Social History   Socioeconomic History  . Marital status: Single    Spouse name: n/a  . Number of children: 0  . Years of education: college  . Highest education level: Not on file  Occupational History  . Occupation: Aeronautical engineer: [employer]  . Occupation: IT consultant: [employer]  Social Needs  . Financial resource strain: Not on file  . Food insecurity:    Worry: Not on file    Inability: Not on file  . Transportation needs:    Medical: Not on file    Non-medical: Not on file  Tobacco Use  . Smoking status: Former Smoker    Last attempt to quit: 04/30/1994    Years since quitting: 23.8  . Smokeless tobacco:  Former Systems developer    Quit date: 03/09/1993  Substance and Sexual Activity  . Alcohol use: No    Alcohol/week: 0.0 oz  . Drug use: No  . Sexual activity: Never  Lifestyle  . Physical activity:    Days per week: Not on file    Minutes per session: Not on file  . Stress: Not on file  Relationships  . Social connections:    Talks on phone: Not on file    Gets together: Not on file    Attends religious service: Not on file    Active member of club or organization: Not on file    Attends meetings of clubs or organizations: Not on file    Relationship status: Not on file  . Intimate partner violence:    Fear of current or ex partner: Not on file    Emotionally abused: Not on file    Physically abused: Not on file    Forced sexual activity: Not on file  Other Topics Concern  . Not on file  Social History Narrative   Home Environment:Lives at home alone with her two dogs. One level home with a few stairs to the front and back doors.   Diet: Stays clear of carbohydrates and chocolate because they trigger headaches. Likes meat/chicken and vegetables. Generally healthy.   Exercise: Stays busy and active at work. Takes the stairs instead of the elevator.   Sleep: Significantly improved since hip replacement surgeries. Goes to bed around 9 and wakes up at 6. Able to work full days.   Work: Still enjoys working at [employer].   Urinary problems: No concerns. Hydrates well.      Brother lives in [place], [place]    Review of Systems: ROS is o/w negative except as mentioned in HPI.  Physical Exam: Vital signs in last 24 hours: Temp:  [97.9 F (36.6 C)-98 F (36.7 C)] 98 F (36.7 C) (03/20 2254) Pulse Rate:  [81-120] 97 (03/21 0800) Resp:  [12-18]  16 (03/21 0800) BP: (121-154)/(61-87) 130/72 (03/21 0800) SpO2:  [95 %-100 %] 97 % (03/21 0800) Weight:  [104 lb (47.2 kg)] 104 lb (47.2 kg) (03/20 2058)   General:  Alert, thin white female, in NAD.  Pleasant and cooperative. Head:  Normocephalic  and atraumatic. Eyes:  Sclera clear, no icterus.  Conjunctiva pink. Ears:  Normal auditory acuity. Mouth:  No deformity or lesions.   Lungs:  Clear throughout to auscultation.  No wheezes, crackles, or rhonchi.  Heart:  Regular rate and rhythm; no murmurs, clicks, rubs, or gallops. Abdomen:  Soft, non-distended.  BS present.  Non-tender. Rectal:  Deferred  Msk:  Symmetrical without gross deformities. Pulses:  Normal pulses noted. Extremities:  Without clubbing or edema. Neurologic:  Alert and oriented x 4;  grossly normal neurologically. Skin:  Intact without significant lesions or rashes. Psych:  Alert and cooperative. Normal mood and affect.  Intake/Output from previous day: 03/20 0701 - 03/21 0700 In: 1000 [IV Piggyback:1000] Out: -   Lab Results: Recent Labs    03/01/18 2330 03/02/18 0015  WBC 5.4  --   HGB 17.9* 9.2*  HCT 51.2* 27.4*  PLT 237  --    BMET Recent Labs    03/01/18 2330  NA 141  K 3.0*  CL 90*  CO2 33*  GLUCOSE 113*  BUN 33*  CREATININE 0.80  CALCIUM 9.2   LFT Recent Labs    03/01/18 2330  PROT 6.5  ALBUMIN 3.3*  AST 21  ALT 25  ALKPHOS 84  BILITOT 0.8   Studies/Results: Ct Abdomen Pelvis W Contrast  Result Date: 03/02/2018 CLINICAL DATA:  Hematemesis.  Nausea and vomiting. EXAM: CT ABDOMEN AND PELVIS WITH CONTRAST TECHNIQUE: Multidetector CT imaging of the abdomen and pelvis was performed using the standard protocol following bolus administration of intravenous contrast. CONTRAST:  80 cc Isovue-300 IV COMPARISON:  Radiograph earlier this day. FINDINGS: Lower chest: Linear atelectasis/scarring in both lower lobes. No consolidation or pleural fluid. Hepatobiliary: Scattered tiny hepatic hypodensities are too small to characterize, may be small cysts, hamartomas or hemangiomas. Calcified gallstone within minimally distended gallbladder. No pericholecystic inflammation. No biliary dilatation. Pancreas: Parenchymal atrophy. No ductal dilatation  or inflammation. Spleen: Normal in size without focal abnormality. Adrenals/Urinary Tract: Normal adrenal glands. No hydronephrosis or perinephric edema. Homogeneous renal enhancement with symmetric excretion on delayed phase imaging. Urinary bladder is physiologically distended, detailed evaluation obscured by streak artifact from bilateral hip arthroplasties. Stomach/Bowel: Mild-to-moderate gastric wall thickening about the cardia. There is moderate near circumferential wall thickening about the gastric body. No definite perigastric inflammation. 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 No other bowel inflammation or wall thickening. No obstruction. Streak artifact from bilateral hip arthroplasties partially limits evaluation of pelvic bowel loops. Vascular/Lymphatic: Mild aortic atherosclerosis. Incidental circumaortic left renal vein. No enlarged abdominal or pelvic lymph nodes, allowing for streak artifact in the pelvis. Reproductive: Post hysterectomy. No gross adnexal mass allowing for streak artifact. Other: No ascites or free air.  No intra-abdominal abscess. Musculoskeletal: Bilateral hip arthroplasties. There are no acute or suspicious osseous abnormalities. IMPRESSION: 1. Near circumferential gastric wall thickening about the body, differential considerations include gastritis, peptic ulcer disease, or gastric mass. Recommend correlation with endoscopy. There is also wall thickening about the gastric cardia. 2. Gallstone without gallbladder inflammation. 3. Tiny hepatic hypodensities which are too small to accurately characterize. 4.  Aortic Atherosclerosis (ICD10-I70.0). Electronically Signed   By: Threasa Beards  Ehinger M.D.   On: 03/02/2018 02:27   Dg Abd 2 Views  Result Date: 03/01/2018 CLINICAL DATA:  Right upper quadrant and epigastric tenderness and pain for over week, nausea and vomiting  EXAM: ABDOMEN - 2 VIEW COMPARISON:  None. FINDINGS: Supine and erect views the abdomen show no bowel obstruction. No free air is seen on the erect view. Of rounded metallic opacity in the midline of the lower chest on the initial view represents the patient's necklace which was moved on the second view. No opaque calculi are seen. 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 There are degenerative changes at the L5-S1 level. IMPRESSION: 1. No bowel obstruction.  No free air. 2. Bilateral total hip replacements. Electronically Signed   By: Ivar Drape M.D.   On: 03/01/2018 15:07   IMPRESSION:  *67 year old female with hematemesis x 2 and black, heme positive stool with about a 2 gram drop in Hgb after experiencing nausea and daily vomiting for several days.  Had been taking NSAID's for leg pain after a fracture and CT scan shows gastric wall thickening.  Also reports 10 pound weight loss over the past several days as well.  Rule out PUD vs gastritis vs malignancy.  PLAN: *EGD later this afternoon. *Will give another dose of pantoprazole and a dose of zofran now.   Laban Emperor. Zehr  03/02/2018, 8:55 AM  Pager number [phone]  I have reviewed the entire case in detail with the above APP and discussed the plan in detail.  Therefore, I agree with the diagnoses recorded above. In addition,  I have personally interviewed and examined the patient and have personally reviewed any abdominal/pelvic CT scan images.  My additional thoughts are as follows:  NSAID use raises suspicion for GU, but weight loss is bothersome, especially with CT findings. Gallstone appears to be an incidental finding.  She is stable and EGD is next step for evaluation.  She is agreeable after discussion of procedure and risks.   The benefits and risks of the planned procedure were described in detail with the patient or (when appropriate) their health care proxy.  Risks were outlined as including, but not limited to, bleeding,  infection, perforation, adverse medication reaction leading to cardiac or pulmonary decompensation, or pancreatitis (if ERCP).  The limitation of incomplete mucosal visualization was also discussed.  No guarantees or warranties were given.   Nelida Meuse III Pager [phone]  Mon-Fri 8a-5p [phone] after 5p, weekends, holidays

## 2018-03-02 NOTE — ED Notes (Signed)
ED TO INPATIENT HANDOFF REPORT  Name/Age/Gender Carol King 67 y.o. female  Code Status    Code Status Orders  (From admission, onward)        Start     Ordered   03/02/18 0800  Full code  Continuous     03/02/18 0801    Code Status History    Date Active Date Inactive Code Status Order ID Comments User Context   05/06/2015 1140 05/07/2015 1643 Full Code [id number]  Norman Herrlich Inpatient      Home/SNF/Other Home  Chief Complaint Hematemesis  Level of Care/Admitting Diagnosis ED Disposition    ED Disposition Condition [place] [100102]  Level of Care: Med-Surg [16]  Diagnosis: GI bleed [818299]  Admitting Physician: Gerlean Ren [facility] [[id number]]  Attending Physician: Gerlean Ren [facility] [[id number]]  Estimated length of stay: past midnight tomorrow  Certification:: I certify this patient will need inpatient services for at least 2 midnights  PT Class (Do Not Modify): Inpatient [101]  PT Acc Code (Do Not Modify): Private [1]       Medical History Past Medical History:  Diagnosis Date  . Arthritis   . Complication of anesthesia   . Difficulty sleeping    DUE TO HIP PAIN  . Headache    MIGRAINES  . History of skin cancer   . Hypertension   . PONV (postoperative nausea and vomiting)    SEVERAL YRS AGO - NONE WITH MORE RECENT SURGERIES  . Skin cancer    BCC, SCC    Allergies Allergies  Allergen Reactions  . Darvon [Propoxyphene Hcl]     "climb the walls"  . Vicodin [Hydrocodone-Acetaminophen] Other (See Comments)    "climb the walls"    IV Location/Drains/Wounds Patient Lines/Drains/Airways Status   Active Line/Drains/Airways    Name:   Placement date:   Placement time:   Site:   Days:   Peripheral IV 03/02/18 Left Antecubital   03/02/18    0809    Antecubital   less than 1   Incision (Closed) 05/06/15 Hip Left   05/06/15    0957     1031          Labs/Imaging Results for orders  placed or performed during the hospital encounter of 03/01/18 (from the past 48 hour(s))  Comprehensive metabolic panel     Status: Abnormal   Collection Time: 03/01/18 11:30 PM  Result Value Ref Range   Sodium 141 135 - 145 mmol/L   Potassium 3.0 (L) 3.5 - 5.1 mmol/L   Chloride 90 (L) 101 - 111 mmol/L   CO2 33 (H) 22 - 32 mmol/L   Glucose, Bld 113 (H) 65 - 99 mg/dL   BUN 33 (H) 6 - 20 mg/dL   Creatinine, Ser 0.80 0.44 - 1.00 mg/dL   Calcium 9.2 8.9 - 10.3 mg/dL   Total Protein 6.5 6.5 - 8.1 g/dL   Albumin 3.3 (L) 3.5 - 5.0 g/dL   AST 21 15 - 41 U/L   ALT 25 14 - 54 U/L   Alkaline Phosphatase 84 38 - 126 U/L   Total Bilirubin 0.8 0.3 - 1.2 mg/dL   GFR calc non Af Amer >60 >60 mL/min   GFR calc Af Amer >60 >60 mL/min    Comment: (NOTE) The eGFR has been calculated using the CKD EPI equation. This calculation has not been validated in all clinical situations. eGFR's persistently <60 mL/min signify possible Chronic Kidney Disease.  Anion gap 18 (H) 5 - 15    Comment: Performed at [facility], [place] [address] [place], [place] [address]  Type and screen [place]     Status: None   Collection Time: 03/01/18 11:30 PM  Result Value Ref Range   ABO/RH(D) O POS    Antibody Screen NEG    Sample Expiration      03/04/2018 Performed at [facility], [place] [address] [place], [place] [address]   CBC with Differential     Status: Abnormal   Collection Time: 03/01/18 11:30 PM  Result Value Ref Range   WBC 5.4 4.0 - 10.5 K/uL   RBC 6.18 (H) 3.87 - 5.11 MIL/uL   Hemoglobin 17.9 (H) 12.0 - 15.0 g/dL   HCT 51.2 (H) 36.0 - 46.0 %   MCV 82.8 78.0 - 100.0 fL   MCH 29.0 26.0 - 34.0 pg   MCHC 35.0 30.0 - 36.0 g/dL   RDW 13.5 11.5 - 15.5 %   Platelets 237 150 - 400 K/uL   Neutrophils Relative % 54 %   Neutro Abs 2.9 1.7 - 7.7 K/uL   Lymphocytes Relative 38 %   Lymphs Abs 2.0 0.7 - 4.0 K/uL   Monocytes Relative 8 %   Monocytes  Absolute 0.4 0.1 - 1.0 K/uL   Eosinophils Relative 0 %   Eosinophils Absolute 0.0 0.0 - 0.7 K/uL   Basophils Relative 0 %   Basophils Absolute 0.0 0.0 - 0.1 K/uL    Comment: Performed at [facility], [place] [address] [place], [place] [address]  Lipase, blood     Status: None   Collection Time: 03/01/18 11:30 PM  Result Value Ref Range   Lipase 33 11 - 51 U/L    Comment: Performed at [facility], [place] [address] [place], [place] [address]  POC occult blood, ED     Status: Abnormal   Collection Time: 03/01/18 11:48 PM  Result Value Ref Range   Fecal Occult Bld POSITIVE (A) NEGATIVE  Hemoglobin and hematocrit, blood     Status: Abnormal   Collection Time: 03/02/18 12:15 AM  Result Value Ref Range   Hemoglobin 9.2 (L) 12.0 - 15.0 g/dL    Comment: DELTA CHECK NOTED REPEATED TO VERIFY RESULT CHECKED    HCT 27.4 (L) 36.0 - 46.0 %    Comment: Performed at [facility], [place] [address], [place], [place] [address]   Ct Abdomen Pelvis W Contrast  Result Date: 03/02/2018 CLINICAL DATA:  Hematemesis.  Nausea and vomiting. EXAM: CT ABDOMEN AND PELVIS WITH CONTRAST TECHNIQUE: Multidetector CT imaging of the abdomen and pelvis was performed using the standard protocol following bolus administration of intravenous contrast. CONTRAST:  80 cc Isovue-300 IV COMPARISON:  Radiograph earlier this day. FINDINGS: Lower chest: Linear atelectasis/scarring in both lower lobes. No consolidation or pleural fluid. Hepatobiliary: Scattered tiny hepatic hypodensities are too small to characterize, may be small cysts, hamartomas or hemangiomas. Calcified gallstone within minimally distended gallbladder. No pericholecystic inflammation. No biliary dilatation. Pancreas: Parenchymal atrophy. No ductal dilatation or inflammation. Spleen: Normal in size without focal abnormality. Adrenals/Urinary Tract: Normal adrenal glands. No hydronephrosis or perinephric edema.  Homogeneous renal enhancement with symmetric excretion on delayed phase imaging. Urinary bladder is physiologically distended, detailed evaluation obscured by streak artifact from bilateral hip arthroplasties. Stomach/Bowel: Mild-to-moderate gastric wall thickening about the cardia. There is moderate near circumferential wall thickening about the gastric body. No definite perigastric  inflammation. 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 No other bowel inflammation or wall thickening. No obstruction. Streak artifact from bilateral hip arthroplasties partially limits evaluation of pelvic bowel loops. Vascular/Lymphatic: Mild aortic atherosclerosis. Incidental circumaortic left renal vein. No enlarged abdominal or pelvic lymph nodes, allowing for streak artifact in the pelvis. Reproductive: Post hysterectomy. No gross adnexal mass allowing for streak artifact. Other: No ascites or free air.  No intra-abdominal abscess. Musculoskeletal: Bilateral hip arthroplasties. There are no acute or suspicious osseous abnormalities. IMPRESSION: 1. Near circumferential gastric wall thickening about the body, differential considerations include gastritis, peptic ulcer disease, or gastric mass. Recommend correlation with endoscopy. There is also wall thickening about the gastric cardia. 2. Gallstone without gallbladder inflammation. 3. Tiny hepatic hypodensities which are too small to accurately characterize. 4.  Aortic Atherosclerosis (ICD10-I70.0). Electronically Signed   By: Jeb Levering M.D.   On: 03/02/2018 02:27   Dg Abd 2 Views  Result Date: 03/01/2018 CLINICAL DATA:  Right upper quadrant and epigastric tenderness and pain for over week, nausea and vomiting EXAM: ABDOMEN - 2 VIEW COMPARISON:  None. FINDINGS: Supine and erect views the abdomen show no bowel obstruction. No free air is seen on the erect view. Of  rounded metallic opacity in the midline of the lower chest on the initial view represents the patient's necklace which was moved on the second view. No opaque calculi are seen. 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 There are degenerative changes at the L5-S1 level. IMPRESSION: 1. No bowel obstruction.  No free air. 2. Bilateral total hip replacements. Electronically Signed   By: Ivar Drape M.D.   On: 03/01/2018 15:07    Pending Labs Unresulted Labs (From admission, onward)   Start     Ordered   03/03/18 0500  Comprehensive metabolic panel  Tomorrow morning,   R     03/02/18 0801   03/03/18 0500  CBC  Tomorrow morning,   R     03/02/18 0801   03/03/18 0500  Protime-INR  Tomorrow morning,   R     03/02/18 0801   03/03/18 0500  APTT  Tomorrow morning,   R     03/02/18 0801   03/02/18 0801  Iron and TIBC  Once,   R     03/02/18 0801   03/02/18 0801  Ferritin  Once,   R     03/02/18 0801   03/02/18 0801  Vitamin B12  Once,   R     03/02/18 0801   03/02/18 0801  Folate RBC  Once,   R     03/02/18 0801      Vitals/Pain Today's Vitals   03/02/18 0413 03/02/18 0505 03/02/18 0636 03/02/18 0800  BP: 121/81 137/72 137/61 130/72  Pulse: (!) 106 (!) 107 96 97  Resp: _0 Temp:      TempSrc:      SpO2: 97% 98% 96% 97%  Weight:      Height:      PainSc:        Isolation Precautions No active isolations  Medications Medications  iopamidol (ISOVUE-300) 61 % injection (has no administration in time range)  sodium chloride 0.9 % injection (has no administration in time range)  pantoprazole (PROTONIX) injection 40 mg (40 mg Intravenous Given 03/02/18 0828)  ondansetron (ZOFRAN) injection 4 mg (has no administration in time range)  0.9 %  sodium chloride infusion (  Intravenous New Bag/Given 03/02/18 0828)  promethazine (PHENERGAN) tablet 12.5 mg (has no administration in time range)  insulin aspart (novoLOG) injection 0-9 Units (has no administration in time range)  sodium  chloride 0.9 % bolus 1,000 mL (0 mLs Intravenous Stopped 03/02/18 0304)  ondansetron (ZOFRAN) injection 4 mg (4 mg Intravenous Given 03/01/18 2339)  pantoprazole (PROTONIX) injection 40 mg (40 mg Intravenous Given 03/01/18 2338)  iopamidol (ISOVUE-300) 61 % injection (30 mLs Oral Contrast Given 03/01/18 2335)  iopamidol (ISOVUE-300) 61 % injection 100 mL (80 mLs Intravenous Contrast Given 03/02/18 0305)  potassium chloride SA (K-DUR,KLOR-CON) CR tablet 40 mEq (40 mEq Oral Given 03/02/18 0502)    Mobility walks with person assist

## 2018-03-02 NOTE — H&P (View-Only) (Signed)
Referring Provider: Dr. Reesa Chew Primary Care Physician:  Harrison Mons, PA-C Primary Gastroenterologist:  None, unassigned  Reason for Consultation:  UGIB, abnormal CT scan  HPI: Carol King is a 67 y.o. female with medical history significant of osteoporosis, hyperthyroidism, migraine, right tibial fracture with cast in place who presented to the hospital with complaints of hematemesis.  Patient states she started taking 4 advil 3-4 times daily for about 10 days max after suffering a tibial fracture.  Then on March 9 she started having feeling of intense nausea and 1-2 episodes of daily.  At that point she discontinued the Advil.  She denies absolutely any other complaints including abdominal pain.  Her only other issue is about a 10 pound weight loss over this time due to inability to eat and keep food down.  A couple of days ago she had a sticky black stool, then 2 days ago she started having episodes of hematemesis with scant blood.  Due to this she was seen by her PCP couple of days ago who performed routine lab and was found to have a hemoglobin around 11 grams.  When she started vomiting the blood she was told to come to the ED.  In the waiting room she vomited about a half cupful of marooned colored blood/emesis.  Denies any previous history of bleeding.  She has never had colonoscopy or endoscopy but states for some reason had flex sigmoidoscopy several years ago.  By personal choice she did not ever have a colonoscopy and does not want one unless absolutely necessary.  In the ED her initial hemoglobin was noted to be 17.9 grams but there was a lab error and repeat labs showed hemoglobin of 9.2 grams, which is a drop from 11 gram range just 2 days ago.  Her potassium was noted to be 3.0.  In the ER she was given 1 dose of Protonix and oral potassium.  Medical team was asked to admit and GI was consulted.  She also had a CT scan that showed the following:  IMPRESSION: 1. Near  circumferential gastric wall thickening about the body, differential considerations include gastritis, peptic ulcer disease, or gastric mass. Recommend correlation with endoscopy. There is also wall thickening about the gastric cardia. 2. Gallstone without gallbladder inflammation. 3. Tiny hepatic hypodensities which are too small to accurately characterize. 4.  Aortic Atherosclerosis (ICD10-I70.0).  Otherwise she has no complaints this AM.  Has been NPO.  Has been started on PPI IV BID.   Past Medical History:  Diagnosis Date  . Arthritis   . Complication of anesthesia   . Difficulty sleeping    DUE TO HIP PAIN  . Headache    MIGRAINES  . History of skin cancer   . Hypertension   . PONV (postoperative nausea and vomiting)    SEVERAL YRS AGO - NONE WITH MORE RECENT SURGERIES  . Skin cancer    BCC, SCC    Past Surgical History:  Procedure Laterality Date  . ABDOMINAL HYSTERECTOMY  1981   one ovary remains  . APPENDECTOMY    . SKIN CANCER EXCISION    . TONSILLECTOMY AND ADENOIDECTOMY    . TOTAL HIP ARTHROPLASTY Right 07/11/2011   HIP; [facility]  . TOTAL HIP ARTHROPLASTY Left 05/06/2015   Procedure: LEFT TOTAL HIP ARTHROPLASTY ANTERIOR APPROACH;  Surgeon: Paralee Cancel, MD;  Location: [facility];  Service: Orthopedics;  Laterality: Left;    Prior to Admission medications   Medication Sig Start Date End Date Taking? Authorizing  Provider  alendronate (FOSAMAX) 70 MG tablet Take 1 tablet (70 mg total) by mouth every 7 (seven) days. Take with a full glass of water on an empty stomach. 10/14/17  Yes Jeffery, Chelle, PA-C  Ascorbic Acid (VITAMIN C PO) Take 1 tablet by mouth every morning. Time Release - Emiliano Dyer.   Yes [provider]  CALCIUM PO Take 2 tablets by mouth every morning.   Yes [provider]  loratadine (CLARITIN) 10 MG tablet Take 10 mg by mouth every morning.   Yes [provider]  Magnesium 250 MG TABS Take 500 mg by mouth every morning.  [employer].   Yes [provider]  Multiple Vitamin (MULTIVITAMIN WITH MINERALS) TABS tablet Take 2 tablets by mouth every morning.   Yes [provider]  OVER THE COUNTER MEDICATION Take 2 tablets by mouth every morning. [place].   Yes [provider]  OVER THE COUNTER MEDICATION Take 2 tablets by mouth every morning. Skeletal Strength- Natural Sunshine.   Yes [provider]  SUMAtriptan (IMITREX) 100 MG tablet Take 1 as needed for migraineMay repeat in 2 hours if headache persists or recurs. 06/10/17  Yes Jeffery, Chelle, PA-C  acetaminophen (TYLENOL) 325 MG tablet Take 2 tablets (650 mg total) by mouth every 6 (six) hours as needed for mild pain (or Fever >/= 101). Patient not taking: Reported on 11/23/2016 05/06/15   Danae Orleans, PA-C    Current Facility-Administered Medications  Medication Dose Route Frequency Provider Last Rate Last Dose  . 0.9 %  sodium chloride infusion   Intravenous Continuous Amin, Ankit Chirag, MD 100 mL/hr at 03/02/18 0828    . insulin aspart (novoLOG) injection 0-9 Units  0-9 Units Subcutaneous TID WC Amin, Ankit Chirag, MD      . iopamidol (ISOVUE-300) 61 % injection           . ondansetron (ZOFRAN) injection 4 mg  4 mg Intravenous Q6H PRN Amin, Ankit Chirag, MD      . pantoprazole (PROTONIX) injection 40 mg  40 mg Intravenous Q12H Amin, Ankit Chirag, MD   40 mg at 03/02/18 0828  . promethazine (PHENERGAN) tablet 12.5 mg  12.5 mg Oral Q6H PRN Amin, Ankit Chirag, MD      . sodium chloride 0.9 % injection            Current Outpatient Medications  Medication Sig Dispense Refill  . alendronate (FOSAMAX) 70 MG tablet Take 1 tablet (70 mg total) by mouth every 7 (seven) days. Take with a full glass of water on an empty stomach. 12 tablet 3  . Ascorbic Acid (VITAMIN C PO) Take 1 tablet by mouth every morning. Time Release - Emiliano Dyer.    Marland Kitchen CALCIUM PO Take 2 tablets by mouth every morning.      . loratadine (CLARITIN) 10 MG tablet Take 10 mg by mouth every morning.    . Magnesium 250 MG TABS Take 500 mg by mouth every morning. [employer].    . Multiple Vitamin (MULTIVITAMIN WITH MINERALS) TABS tablet Take 2 tablets by mouth every morning.    Marland Kitchen OVER THE COUNTER MEDICATION Take 2 tablets by mouth every morning. [place].    Marland Kitchen OVER THE COUNTER MEDICATION Take 2 tablets by mouth every morning. Skeletal Strength- Natural Sunshine.    . SUMAtriptan (IMITREX) 100 MG tablet Take 1 as needed for migraineMay repeat in 2 hours if headache persists or recurs. 9 tablet 12  .  acetaminophen (TYLENOL) 325 MG tablet Take 2 tablets (650 mg total) by mouth every 6 (six) hours as needed for mild pain (or Fever >/= 101). (Patient not taking: Reported on 11/23/2016)      Allergies as of 03/01/2018 - Review Complete 03/01/2018  Allergen Reaction Noted  . Darvon [propoxyphene hcl]  04/10/2012  . Vicodin [hydrocodone-acetaminophen] Other (See Comments) 04/10/2012    Family History  Problem Relation Age of Onset  . Cancer Mother 1       breast cancer  . Cancer Maternal Grandmother        breast cancer  . Kidney disease Maternal Grandfather   . Arthritis Paternal Grandmother     Social History   Socioeconomic History  . Marital status: Single    Spouse name: n/a  . Number of children: 0  . Years of education: college  . Highest education level: Not on file  Occupational History  . Occupation: Aeronautical engineer: [employer]  . Occupation: IT consultant: [employer]  Social Needs  . Financial resource strain: Not on file  . Food insecurity:    Worry: Not on file    Inability: Not on file  . Transportation needs:    Medical: Not on file    Non-medical: Not on file  Tobacco Use  . Smoking status: Former Smoker    Last attempt to quit: 04/30/1994    Years since quitting: 23.8  . Smokeless tobacco:  Former Systems developer    Quit date: 03/09/1993  Substance and Sexual Activity  . Alcohol use: No    Alcohol/week: 0.0 oz  . Drug use: No  . Sexual activity: Never  Lifestyle  . Physical activity:    Days per week: Not on file    Minutes per session: Not on file  . Stress: Not on file  Relationships  . Social connections:    Talks on phone: Not on file    Gets together: Not on file    Attends religious service: Not on file    Active member of club or organization: Not on file    Attends meetings of clubs or organizations: Not on file    Relationship status: Not on file  . Intimate partner violence:    Fear of current or ex partner: Not on file    Emotionally abused: Not on file    Physically abused: Not on file    Forced sexual activity: Not on file  Other Topics Concern  . Not on file  Social History Narrative   Home Environment:Lives at home alone with her two dogs. One level home with a few stairs to the front and back doors.   Diet: Stays clear of carbohydrates and chocolate because they trigger headaches. Likes meat/chicken and vegetables. Generally healthy.   Exercise: Stays busy and active at work. Takes the stairs instead of the elevator.   Sleep: Significantly improved since hip replacement surgeries. Goes to bed around 9 and wakes up at 6. Able to work full days.   Work: Still enjoys working at [employer].   Urinary problems: No concerns. Hydrates well.      Brother lives in [place], [place]    Review of Systems: ROS is o/w negative except as mentioned in HPI.  Physical Exam: Vital signs in last 24 hours: Temp:  [97.9 F (36.6 C)-98 F (36.7 C)] 98 F (36.7 C) (03/20 2254) Pulse Rate:  [81-120] 97 (03/21 0800) Resp:  [12-18]  16 (03/21 0800) BP: (121-154)/(61-87) 130/72 (03/21 0800) SpO2:  [95 %-100 %] 97 % (03/21 0800) Weight:  [104 lb (47.2 kg)] 104 lb (47.2 kg) (03/20 2058)   General:  Alert, thin white female, in NAD.  Pleasant and cooperative. Head:  Normocephalic  and atraumatic. Eyes:  Sclera clear, no icterus.  Conjunctiva pink. Ears:  Normal auditory acuity. Mouth:  No deformity or lesions.   Lungs:  Clear throughout to auscultation.  No wheezes, crackles, or rhonchi.  Heart:  Regular rate and rhythm; no murmurs, clicks, rubs, or gallops. Abdomen:  Soft, non-distended.  BS present.  Non-tender. Rectal:  Deferred  Msk:  Symmetrical without gross deformities. Pulses:  Normal pulses noted. Extremities:  Without clubbing or edema. Neurologic:  Alert and oriented x 4;  grossly normal neurologically. Skin:  Intact without significant lesions or rashes. Psych:  Alert and cooperative. Normal mood and affect.  Intake/Output from previous day: 03/20 0701 - 03/21 0700 In: 1000 [IV Piggyback:1000] Out: -   Lab Results: Recent Labs    03/01/18 2330 03/02/18 0015  WBC 5.4  --   HGB 17.9* 9.2*  HCT 51.2* 27.4*  PLT 237  --    BMET Recent Labs    03/01/18 2330  NA 141  K 3.0*  CL 90*  CO2 33*  GLUCOSE 113*  BUN 33*  CREATININE 0.80  CALCIUM 9.2   LFT Recent Labs    03/01/18 2330  PROT 6.5  ALBUMIN 3.3*  AST 21  ALT 25  ALKPHOS 84  BILITOT 0.8   Studies/Results: Ct Abdomen Pelvis W Contrast  Result Date: 03/02/2018 CLINICAL DATA:  Hematemesis.  Nausea and vomiting. EXAM: CT ABDOMEN AND PELVIS WITH CONTRAST TECHNIQUE: Multidetector CT imaging of the abdomen and pelvis was performed using the standard protocol following bolus administration of intravenous contrast. CONTRAST:  80 cc Isovue-300 IV COMPARISON:  Radiograph earlier this day. FINDINGS: Lower chest: Linear atelectasis/scarring in both lower lobes. No consolidation or pleural fluid. Hepatobiliary: Scattered tiny hepatic hypodensities are too small to characterize, may be small cysts, hamartomas or hemangiomas. Calcified gallstone within minimally distended gallbladder. No pericholecystic inflammation. No biliary dilatation. Pancreas: Parenchymal atrophy. No ductal dilatation  or inflammation. Spleen: Normal in size without focal abnormality. Adrenals/Urinary Tract: Normal adrenal glands. No hydronephrosis or perinephric edema. Homogeneous renal enhancement with symmetric excretion on delayed phase imaging. Urinary bladder is physiologically distended, detailed evaluation obscured by streak artifact from bilateral hip arthroplasties. Stomach/Bowel: Mild-to-moderate gastric wall thickening about the cardia. There is moderate near circumferential wall thickening about the gastric body. No definite perigastric inflammation. 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 No other bowel inflammation or wall thickening. No obstruction. Streak artifact from bilateral hip arthroplasties partially limits evaluation of pelvic bowel loops. Vascular/Lymphatic: Mild aortic atherosclerosis. Incidental circumaortic left renal vein. No enlarged abdominal or pelvic lymph nodes, allowing for streak artifact in the pelvis. Reproductive: Post hysterectomy. No gross adnexal mass allowing for streak artifact. Other: No ascites or free air.  No intra-abdominal abscess. Musculoskeletal: Bilateral hip arthroplasties. There are no acute or suspicious osseous abnormalities. IMPRESSION: 1. Near circumferential gastric wall thickening about the body, differential considerations include gastritis, peptic ulcer disease, or gastric mass. Recommend correlation with endoscopy. There is also wall thickening about the gastric cardia. 2. Gallstone without gallbladder inflammation. 3. Tiny hepatic hypodensities which are too small to accurately characterize. 4.  Aortic Atherosclerosis (ICD10-I70.0). Electronically Signed   By: Threasa Beards  Ehinger M.D.   On: 03/02/2018 02:27   Dg Abd 2 Views  Result Date: 03/01/2018 CLINICAL DATA:  Right upper quadrant and epigastric tenderness and pain for over week, nausea and vomiting  EXAM: ABDOMEN - 2 VIEW COMPARISON:  None. FINDINGS: Supine and erect views the abdomen show no bowel obstruction. No free air is seen on the erect view. Of rounded metallic opacity in the midline of the lower chest on the initial view represents the patient's necklace which was moved on the second view. No opaque calculi are seen. 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 There are degenerative changes at the L5-S1 level. IMPRESSION: 1. No bowel obstruction.  No free air. 2. Bilateral total hip replacements. Electronically Signed   By: Ivar Drape M.D.   On: 03/01/2018 15:07   IMPRESSION:  *67 year old female with hematemesis x 2 and black, heme positive stool with about a 2 gram drop in Hgb after experiencing nausea and daily vomiting for several days.  Had been taking NSAID's for leg pain after a fracture and CT scan shows gastric wall thickening.  Also reports 10 pound weight loss over the past several days as well.  Rule out PUD vs gastritis vs malignancy.  PLAN: *EGD later this afternoon. *Will give another dose of pantoprazole and a dose of zofran now.   Laban Emperor. Zehr  03/02/2018, 8:55 AM  Pager number [phone]  I have reviewed the entire case in detail with the above APP and discussed the plan in detail.  Therefore, I agree with the diagnoses recorded above. In addition,  I have personally interviewed and examined the patient and have personally reviewed any abdominal/pelvic CT scan images.  My additional thoughts are as follows:  NSAID use raises suspicion for GU, but weight loss is bothersome, especially with CT findings. Gallstone appears to be an incidental finding.  She is stable and EGD is next step for evaluation.  She is agreeable after discussion of procedure and risks.   The benefits and risks of the planned procedure were described in detail with the patient or (when appropriate) their health care proxy.  Risks were outlined as including, but not limited to, bleeding,  infection, perforation, adverse medication reaction leading to cardiac or pulmonary decompensation, or pancreatitis (if ERCP).  The limitation of incomplete mucosal visualization was also discussed.  No guarantees or warranties were given.   Nelida Meuse III Pager [phone]  Mon-Fri 8a-5p [phone] after 5p, weekends, holidays

## 2018-03-02 NOTE — Anesthesia Procedure Notes (Signed)
Date/Time: 03/02/2018 1:40 PM Performed by: Talbot Grumbling, CRNA Oxygen Delivery Method: Nasal cannula

## 2018-03-02 NOTE — Transfer of Care (Signed)
Immediate Anesthesia Transfer of Care Note  Patient: Carol King  Procedure(s) Performed: ESOPHAGOGASTRODUODENOSCOPY (EGD) WITH PROPOFOL (N/A )  Patient Location: PACU  Anesthesia Type:MAC  Level of Consciousness: sedated  Airway & Oxygen Therapy: Patient Spontanous Breathing and Patient connected to nasal cannula oxygen  Post-op Assessment: Report given to RN and Post -op Vital signs reviewed and stable  Post vital signs: Reviewed and stable  Last Vitals:  Vitals Value Taken Time  BP 119/52 03/02/2018  2:25 PM  Temp    Pulse 90 03/02/2018  2:26 PM  Resp 19 03/02/2018  2:26 PM  SpO2 100 % 03/02/2018  2:26 PM  Vitals shown include unvalidated device data.  Last Pain:  Vitals:   03/02/18 1315  TempSrc: Oral  PainSc: 0-No pain         Complications: No apparent anesthesia complications

## 2018-03-02 NOTE — Interval H&P Note (Signed)
History and Physical Interval Note:  03/02/2018 2:02 PM  Carol King  has presented today for surgery, with the diagnosis of Hematemesis  The various methods of treatment have been discussed with the patient and family. After consideration of risks, benefits and other options for treatment, the patient has consented to  Procedure(s): ESOPHAGOGASTRODUODENOSCOPY (EGD) WITH PROPOFOL (N/A) as a surgical intervention .  The patient's history has been reviewed, patient examined, no change in status, stable for surgery.  I have reviewed the patient's chart and labs.  Questions were answered to the patient's satisfaction.     Nelida Meuse III

## 2018-03-02 NOTE — Op Note (Signed)
[facility] Patient Name: Carol King Procedure Date: 03/02/2018 MRN: [id number] Attending MD: Estill Cotta. Loletha Carrow , MD Date of Birth: [birth year] CSN: [id number] Age: 67 Admit Type: Inpatient Procedure:                Upper GI endoscopy Indications:              Hematemesis, Nausea with vomiting, Weight loss Providers:                Mallie Mussel L. Loletha Carrow, MD, Burtis Junes, RN, Cherylynn Ridges,                            Technician, Marla Roe, CRNA Referring MD:              Medicines:                Monitored Anesthesia Care Complications:            No immediate complications. Estimated Blood Loss:     Estimated blood loss was minimal. Procedure:                Pre-Anesthesia Assessment:                           - Prior to the procedure, a History and Physical                            was performed, and patient medications and                            allergies were reviewed. The patient's tolerance of                            previous anesthesia was also reviewed. The risks                            and benefits of the procedure and the sedation                            options and risks were discussed with the patient.                            All questions were answered, and informed consent                            was obtained. Prior Anticoagulants: The patient has                            taken no previous anticoagulant or antiplatelet                            agents. ASA Grade Assessment: II - A patient with                            mild systemic disease. After reviewing the risks  and benefits, the patient was deemed in                            satisfactory condition to undergo the procedure.                           After obtaining informed consent, the endoscope was                            passed under direct vision. Throughout the                            procedure, the patient's blood pressure, pulse, and                        oxygen saturations were monitored continuously. The                            EG-[id number] ([id number]) scope was introduced through the                            mouth, and advanced to the duodenal bulb. The upper                            GI endoscopy was performed with difficulty due to                            stenosis. Successful completion of the procedure                            was aided by withdrawing the scope and replacing                            with the pediatric endoscope. The patient tolerated                            the procedure well. Scope In: Scope Out: Findings:      A widely patent Schatzki ring (acquired) was found in the distal       esophagus.      Two non-bleeding cratered, benign-appearing gastric ulcers with no       stigmata of bleeding were found in the gastric antrum and in the       prepyloric region of the stomach. The largest lesion (pre-pylori) was       about 15 mm in largest dimension (2/3 circumference of pylorus).       Multiple biopsies were obtained in the gastric body and in the gastric       antrum with cold forceps for histology to rule out H. pylori.      The cardia and gastric fundus were normal on retroflexion.      An acquired benign-appearing, intrinsic severe stenosis was found in the       duodenal bulb and was non-traversed with either the adult or pediatric       EGD scopes. It was ulcerated and inflamed, and appeared to be short. The  lumen was visualized at an eccentric angle, but could not be reached due       to the anatomic distortion and degree of stenosis.      This stricture was causing dilatation of the duodenal bulb. Impression:               - Widely patent Schatzki ring.                           - Non-bleeding gastric ulcers with no stigmata of                            bleeding.                           - Acquired duodenal stenosis.                           - Multiple biopsies were  obtained in the gastric                            body and in the gastric antrum.                           This all appears to be NSAID-related. Moderate Sedation:      MAC sedation used Recommendation:           - Return patient to hospital ward for ongoing care.                           - Full liquid diet.                           - No aspirin, ibuprofen, naproxen, or other                            non-steroidal anti-inflammatory drugs.                           - Await pathology results.                           - Do an upper GI series.                           - Continue present medications, including                            pantprazole 40 mg IV twice daily. Procedure Code(s):        --- Professional ---                           [phone], Esophagogastroduodenoscopy, flexible,                            transoral; with biopsy, single or multiple Diagnosis Code(s):        --- Professional ---  K22.2, Esophageal obstruction                           K25.9, Gastric ulcer, unspecified as acute or                            chronic, without hemorrhage or perforation                           K31.5, Obstruction of duodenum                           K92.0, Hematemesis                           R11.2, Nausea with vomiting, unspecified                           R63.4, Abnormal weight loss CPT copyright 2016 American Medical Association. All rights reserved. The codes documented in this report are preliminary and upon coder review may  be revised to meet current compliance requirements. Henry L. Loletha Carrow, MD 03/02/2018 2:32:49 PM This report has been signed electronically. Number of Addenda: 0

## 2018-03-02 NOTE — Anesthesia Preprocedure Evaluation (Addendum)
Anesthesia Evaluation  Patient identified by MRN, date of birth, ID band Patient awake    Reviewed: Allergy & Precautions, NPO status , Patient's Chart, lab work & pertinent test results  History of Anesthesia Complications (+) PONV  Airway Mallampati: II  TM Distance: >3 FB     Dental   Pulmonary former smoker,    breath sounds clear to auscultation       Cardiovascular hypertension,  Rhythm:Regular Rate:Normal     Neuro/Psych    GI/Hepatic Neg liver ROS, GI History noted. CG   Endo/Other  Hyperthyroidism   Renal/GU negative Renal ROS     Musculoskeletal  (+) Arthritis , Fibromyalgia -  Abdominal   Peds  Hematology   Anesthesia Other Findings   Reproductive/Obstetrics                             Anesthesia Physical Anesthesia Plan  ASA: III  Anesthesia Plan: MAC   Post-op Pain Management:    Induction: Intravenous  PONV Risk Score and Plan: 3 and Treatment may vary due to age or medical condition  Airway Management Planned: Simple Face Mask and Nasal Cannula  Additional Equipment:   Intra-op Plan:   Post-operative Plan:   Informed Consent: I have reviewed the patients History and Physical, chart, labs and discussed the procedure including the risks, benefits and alternatives for the proposed anesthesia with the patient or authorized representative who has indicated his/her understanding and acceptance.   Dental advisory given  Plan Discussed with: CRNA and Anesthesiologist  Anesthesia Plan Comments:         Anesthesia Quick Evaluation

## 2018-03-02 NOTE — H&P (Signed)
History and Physical    Carol King MWN:[id number] DOB: 1951/10/09 DOA: 03/01/2018  PCP: Harrison Mons, PA-C Patient coming from: home  Chief Complaint: Hematemesis   HPI: Carol King is a 67 y.o. female with medical history significant of osteoporosis, hyperthyroidism, migraine, right tibial fracture with cast in place, fibromyalgia comes to the hospital with complaints of hematemesis.  Patient states she started having feeling of intense nausea and 1-2 episodes of daily vomiting starting March 9.  At that time she denied any abdominal pain, diarrhea, fevers and any other complaints.  But as the days progressed she started noticing her stool was turning dark and then 2 days ago she started having episodes of hematemesis with scant blood weight yesterday she had a lot more and then in the ER she had another episode.  Due to this she was seen by her PCP couple of days ago who performed routine lab and was found to have a hemoglobin around 11. About 4 weeks ago she had fractured her right leg and for a week she had been taking ibuprofen every 4 hours until March 9.  Denies any previous history of bleeding.  She has never had colonoscopy or endoscopy but states for some reason had flex sigmoidoscopy several years ago.  By personal choice she did not 10.  We need colonoscopy. She also admits of about 10 pound weight loss due to poor appetite and last 10 days.  In the ER patient had one episode of maroon colored vomiting which is about half a cupful.  Initially her hemoglobin was noted to be 17.9 but there was a lab error the and repeat labs showed hemoglobin of 9.2 which is a drop from 11.92 days ago.  Her potassium was noted to be 3.0.  In the ER she was given 1 dose of Protonix and oral potassium.  Medical team was asked to admit the patient for further care.  When I saw the patient she did not have any complaints and denied any abdominal pain, nausea, vomiting and other complaints.   Review  of Systems: As per HPI otherwise 10 point review of systems negative.  Review of Systems Otherwise negative except as per HPI, including: General: Denies fever, chills, night sweats or unintended weight loss. Resp: Denies cough, wheezing, shortness of breath. Cardiac: Denies chest pain, palpitations, orthopnea, paroxysmal nocturnal dyspnea. GI: Denies abdominal pain, nausea, vomiting, diarrhea or constipation GU: Denies dysuria, frequency, hesitancy or incontinence MS: Denies muscle aches, joint pain or swelling Neuro: Denies headache, neurologic deficits (focal weakness, numbness, tingling), abnormal gait Psych: Denies anxiety, depression, SI/HI/AVH Skin: Denies new rashes or lesions ID: Denies sick contacts, exotic exposures, travel  Past Medical History:  Diagnosis Date  . Arthritis   . Complication of anesthesia   . Difficulty sleeping    DUE TO HIP PAIN  . Headache    MIGRAINES  . History of skin cancer   . Hypertension   . PONV (postoperative nausea and vomiting)    SEVERAL YRS AGO - NONE WITH MORE RECENT SURGERIES  . Skin cancer    BCC, SCC    Past Surgical History:  Procedure Laterality Date  . ABDOMINAL HYSTERECTOMY  1981   one ovary remains  . APPENDECTOMY    . SKIN CANCER EXCISION    . TONSILLECTOMY AND ADENOIDECTOMY    . TOTAL HIP ARTHROPLASTY Right 07/11/2011   HIP; [facility]  . TOTAL HIP ARTHROPLASTY Left 05/06/2015   Procedure: LEFT TOTAL HIP ARTHROPLASTY ANTERIOR APPROACH;  Surgeon: Rodman Key  Alvan Dame, MD;  Location: [facility];  Service: Orthopedics;  Laterality: Left;     reports that she quit smoking about 23 years ago. She quit smokeless tobacco use about 24 years ago. She reports that she does not drink alcohol or use drugs.  Allergies  Allergen Reactions  . Darvon [Propoxyphene Hcl]     "climb the walls"  . Vicodin [Hydrocodone-Acetaminophen] Other (See Comments)    "climb the walls"    Family History  Problem Relation Age of Onset  . Cancer Mother 70        breast cancer  . Cancer Maternal Grandmother        breast cancer  . Kidney disease Maternal Grandfather   . Arthritis Paternal Grandmother      Prior to Admission medications   Medication Sig Start Date End Date Taking? Authorizing Provider  alendronate (FOSAMAX) 70 MG tablet Take 1 tablet (70 mg total) by mouth every 7 (seven) days. Take with a full glass of water on an empty stomach. 10/14/17  Yes Jeffery, Chelle, PA-C  Ascorbic Acid (VITAMIN C PO) Take 1 tablet by mouth every morning. Time Release - Emiliano Dyer.   Yes [provider]  CALCIUM PO Take 2 tablets by mouth every morning.   Yes [provider]  loratadine (CLARITIN) 10 MG tablet Take 10 mg by mouth every morning.   Yes [provider]  Magnesium 250 MG TABS Take 500 mg by mouth every morning. [employer].   Yes [provider]  Multiple Vitamin (MULTIVITAMIN WITH MINERALS) TABS tablet Take 2 tablets by mouth every morning.   Yes [provider]  OVER THE COUNTER MEDICATION Take 2 tablets by mouth every morning. [place].   Yes [provider]  OVER THE COUNTER MEDICATION Take 2 tablets by mouth every morning. Skeletal Strength- [employer].   Yes [provider]  SUMAtriptan (IMITREX) 100 MG tablet Take 1 as needed for migraineMay repeat in 2 hours if headache persists or recurs. 06/10/17  Yes Jeffery, Chelle, PA-C  acetaminophen (TYLENOL) 325 MG tablet Take 2 tablets (650 mg total) by mouth every 6 (six) hours as needed for mild pain (or Fever >/= 101). Patient not taking: Reported on 11/23/2016 05/06/15   Danae Orleans, PA-C    Physical Exam: Vitals:   03/02/18 0400 03/02/18 0413 03/02/18 0505 03/02/18 0636  BP: 121/81 121/81 137/72 137/61  Pulse: (!) 103 (!) 106 (!) 107 96  Resp:  16 12 16   Temp:      TempSrc:      SpO2:  97% 98% 96%  Weight:      Height:          Constitutional: NAD, calm,  comfortable Vitals:   03/02/18 0400 03/02/18 0413 03/02/18 0505 03/02/18 0636  BP: 121/81 121/81 137/72 137/61  Pulse: (!) 103 (!) 106 (!) 107 96  Resp:  16 12 16   Temp:      TempSrc:      SpO2:  97% 98% 96%  Weight:      Height:       Eyes: PERRL, lids and conjunctivae normal ENMT: Mucous membranes are moist. Posterior pharynx clear of any exudate or lesions.Normal dentition.  Neck: normal, supple, no masses, no thyromegaly Respiratory: clear to auscultation bilaterally, no wheezing, no crackles. Normal respiratory effort. No accessory muscle use.  Cardiovascular: Regular rate and rhythm, no murmurs / rubs / gallops. No extremity edema. 2+ pedal pulses. No carotid bruits.  Abdomen:  no tenderness, no masses palpated. No hepatosplenomegaly. Bowel sounds positive.  Musculoskeletal: no clubbing / cyanosis. No joint deformity upper and lower extremities. Good ROM, no contractures. Normal muscle tone.  Skin: no rashes, lesions, ulcers. No induration Neurologic: CN 2-12 grossly intact. Sensation intact, DTR normal. Strength 5/5 in all 4.  Psychiatric: Normal judgment and insight. Alert and oriented x 3. Normal mood.     Labs on Admission: I have personally reviewed following labs and imaging studies  CBC: Recent Labs  Lab 03/01/18 2330 03/02/18 0015  WBC 5.4  --   NEUTROABS 2.9  --   HGB 17.9* 9.2*  HCT 51.2* 27.4*  MCV 82.8  --   PLT 237  --    Basic Metabolic Panel: Recent Labs  Lab 03/01/18 2330  NA 141  K 3.0*  CL 90*  CO2 33*  GLUCOSE 113*  BUN 33*  CREATININE 0.80  CALCIUM 9.2   GFR: Estimated Creatinine Clearance: 50.2 mL/min (by C-G formula based on SCr of 0.8 mg/dL). Liver Function Tests: Recent Labs  Lab 03/01/18 2330  AST 21  ALT 25  ALKPHOS 84  BILITOT 0.8  PROT 6.5  ALBUMIN 3.3*   Recent Labs  Lab 03/01/18 2330  LIPASE 33   No results for input(s): AMMONIA in the last 168 hours. Coagulation Profile: No results for input(s): INR, PROTIME  in the last 168 hours. Cardiac Enzymes: No results for input(s): CKTOTAL, CKMB, CKMBINDEX, TROPONINI in the last 168 hours. BNP (last 3 results) No results for input(s): PROBNP in the last 8760 hours. HbA1C: No results for input(s): HGBA1C in the last 72 hours. CBG: No results for input(s): GLUCAP in the last 168 hours. Lipid Profile: No results for input(s): CHOL, HDL, LDLCALC, TRIG, CHOLHDL, LDLDIRECT in the last 72 hours. Thyroid Function Tests: No results for input(s): TSH, T4TOTAL, FREET4, T3FREE, THYROIDAB in the last 72 hours. Anemia Panel: No results for input(s): VITAMINB12, FOLATE, FERRITIN, TIBC, IRON, RETICCTPCT in the last 72 hours. Urine analysis:    Component Value Date/Time   COLORURINE YELLOW 05/01/2015 1348   APPEARANCEUR CLOUDY (A) 05/01/2015 1348   LABSPEC 1.018 05/01/2015 1348   PHURINE 6.0 05/01/2015 1348   GLUCOSEU NEGATIVE 05/01/2015 1348   HGBUR NEGATIVE 05/01/2015 1348   BILIRUBINUR negative 06/10/2017 1024   BILIRUBINUR neg 04/19/2013 1554   KETONESUR negative 06/10/2017 1024   KETONESUR NEGATIVE 05/01/2015 1348   PROTEINUR negative 06/10/2017 1024   PROTEINUR NEGATIVE 05/01/2015 1348   UROBILINOGEN 0.2 06/10/2017 1024   UROBILINOGEN 0.2 05/01/2015 1348   NITRITE Negative 06/10/2017 1024   NITRITE NEGATIVE 05/01/2015 1348   LEUKOCYTESUR Negative 06/10/2017 1024   Sepsis Labs: !!!!!!!!!!!!!!!!!!!!!!!!!!!!!!!!!!!!!!!!!!!! @LABRCNTIP (procalcitonin:4,lacticidven:4) )No results found for this or any previous visit (from the past 240 hour(s)).   Radiological Exams on Admission: Ct Abdomen Pelvis W Contrast  Result Date: 03/02/2018 CLINICAL DATA:  Hematemesis.  Nausea and vomiting. EXAM: CT ABDOMEN AND PELVIS WITH CONTRAST TECHNIQUE: Multidetector CT imaging of the abdomen and pelvis was performed using the standard protocol following bolus administration of intravenous contrast. CONTRAST:  80 cc Isovue-300 IV COMPARISON:  Radiograph earlier this day.  FINDINGS: Lower chest: Linear atelectasis/scarring in both lower lobes. No consolidation or pleural fluid. Hepatobiliary: Scattered tiny hepatic hypodensities are too small to characterize, may be small cysts, hamartomas or hemangiomas. Calcified gallstone within minimally distended gallbladder. No pericholecystic inflammation. No biliary dilatation. Pancreas: Parenchymal atrophy. No ductal dilatation or inflammation. Spleen: Normal in size without focal abnormality. Adrenals/Urinary Tract: Normal adrenal glands. No  hydronephrosis or perinephric edema. Homogeneous renal enhancement with symmetric excretion on delayed phase imaging. Urinary bladder is physiologically distended, detailed evaluation obscured by streak artifact from bilateral hip arthroplasties. Stomach/Bowel: Mild-to-moderate gastric wall thickening about the cardia. There is moderate near circumferential wall thickening about the gastric body. No definite perigastric inflammation. 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 No other bowel inflammation or wall thickening. No obstruction. Streak artifact from bilateral hip arthroplasties partially limits evaluation of pelvic bowel loops. Vascular/Lymphatic: Mild aortic atherosclerosis. Incidental circumaortic left renal vein. No enlarged abdominal or pelvic lymph nodes, allowing for streak artifact in the pelvis. Reproductive: Post hysterectomy. No gross adnexal mass allowing for streak artifact. Other: No ascites or free air.  No intra-abdominal abscess. Musculoskeletal: Bilateral hip arthroplasties. There are no acute or suspicious osseous abnormalities. IMPRESSION: 1. Near circumferential gastric wall thickening about the body, differential considerations include gastritis, peptic ulcer disease, or gastric mass. Recommend correlation with endoscopy. There is also wall thickening about the  gastric cardia. 2. Gallstone without gallbladder inflammation. 3. Tiny hepatic hypodensities which are too small to accurately characterize. 4.  Aortic Atherosclerosis (ICD10-I70.0). Electronically Signed   By: Jeb Levering M.D.   On: 03/02/2018 02:27   Dg Abd 2 Views  Result Date: 03/01/2018 CLINICAL DATA:  Right upper quadrant and epigastric tenderness and pain for over week, nausea and vomiting EXAM: ABDOMEN - 2 VIEW COMPARISON:  None. FINDINGS: Supine and erect views the abdomen show no bowel obstruction. No free air is seen on the erect view. Of rounded metallic opacity in the midline of the lower chest on the initial view represents the patient's necklace which was moved on the second view. No opaque calculi are seen. 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 There are degenerative changes at the L5-S1 level. IMPRESSION: 1. No bowel obstruction.  No free air. 2. Bilateral total hip replacements. Electronically Signed   By: Ivar Drape M.D.   On: 03/01/2018 15:07      Assessment/Plan Principal Problem:   GI bleed Active Problems:   Migraine   Hyperthyroidism   Fibromyalgia   History of basal cell carcinoma (BCC)   Osteoporosis    Acute blood loss anemia Upper GI bleed Hematemesis and melena -Admit the patient for further care and monitoring - Suspicion for gastritis/ulcer disease versus Mallory-Weiss tear.  Gastritis and ulcer could be related to her NSAID use -We will keep her n.p.o., start patient on Protonix IV twice daily -Gastroenterology consulted for EGD and may even require colonoscopy -Check iron studies -While n.p.o. will do Accu-Chek and sliding scale every 6 hours -No transfusion necessary at this time.  Will closely monitor hemoglobin  Hypokalemia - Patient had received 42meQ of potassium orally by the ER provider.  History of migraine - PRN meds if needed  History of basal cell carcinoma - Follow-up outpatient.  Has been treated  Osteoporosis - Used  to be on Fosamax but currently on hold  Right foot tibial fracture - Has been followed by orthopedic outpatient, currently has a cast in place.  Hyperthyroidism -Follows with outpatient endocrinology but not on any meds at this time  DVT prophylaxis: SCDs due to bleed.  Code Status: Full  Family Communication: None at bedside  Disposition Plan:  TBD Consults called: GI Admission status: Inpatient Med Surg   Crystalyn Delia Arsenio Loader MD [facility] Pager [phone]  If 7PM-7AM, please contact night-coverage [email or web address] Password [email or web address]  03/02/2018, 8:02 AM

## 2018-03-02 NOTE — Anesthesia Postprocedure Evaluation (Signed)
Anesthesia Post Note  Patient: Carol King  Procedure(s) Performed: ESOPHAGOGASTRODUODENOSCOPY (EGD) WITH PROPOFOL (N/A )     Patient location during evaluation: Endoscopy Anesthesia Type: MAC Level of consciousness: awake Pain management: pain level controlled Vital Signs Assessment: post-procedure vital signs reviewed and stable Respiratory status: spontaneous breathing Cardiovascular status: stable Anesthetic complications: no    Last Vitals:  Vitals Value Taken Time  BP    Temp    Pulse    Resp    SpO2      Last Pain:  Vitals:   03/02/18 1440  TempSrc:   PainSc: 0-No pain                 Shaneika Rossa

## 2018-03-02 NOTE — H&P (View-Only) (Signed)
Referring Provider: Dr. Reesa Chew Primary Care Physician:  Harrison Mons, PA-C Primary Gastroenterologist:  None, unassigned  Reason for Consultation:  UGIB, abnormal CT scan  HPI: Carol King is a 67 y.o. female with medical history significant of osteoporosis, hyperthyroidism, migraine, right tibial fracture with cast in place who presented to the hospital with complaints of hematemesis.  Patient states she started taking 4 advil 3-4 times daily for about 10 days max after suffering a tibial fracture.  Then on March 9 she started having feeling of intense nausea and 1-2 episodes of daily.  At that point she discontinued the Advil.  She denies absolutely any other complaints including abdominal pain.  Her only other issue is about a 10 pound weight loss over this time due to inability to eat and keep food down.  A couple of days ago she had a sticky black stool, then 2 days ago she started having episodes of hematemesis with scant blood.  Due to this she was seen by her PCP couple of days ago who performed routine lab and was found to have a hemoglobin around 11 grams.  When she started vomiting the blood she was told to come to the ED.  In the waiting room she vomited about a half cupful of marooned colored blood/emesis.  Denies any previous history of bleeding.  She has never had colonoscopy or endoscopy but states for some reason had flex sigmoidoscopy several years ago.  By personal choice she did not ever have a colonoscopy and does not want one unless absolutely necessary.  In the ED her initial hemoglobin was noted to be 17.9 grams but there was a lab error and repeat labs showed hemoglobin of 9.2 grams, which is a drop from 11 gram range just 2 days ago.  Her potassium was noted to be 3.0.  In the ER she was given 1 dose of Protonix and oral potassium.  Medical team was asked to admit and GI was consulted.  She also had a CT scan that showed the following:  IMPRESSION: 1. Near  circumferential gastric wall thickening about the body, differential considerations include gastritis, peptic ulcer disease, or gastric mass. Recommend correlation with endoscopy. There is also wall thickening about the gastric cardia. 2. Gallstone without gallbladder inflammation. 3. Tiny hepatic hypodensities which are too small to accurately characterize. 4.  Aortic Atherosclerosis (ICD10-I70.0).  Otherwise she has no complaints this AM.  Has been NPO.  Has been started on PPI IV BID.   Past Medical History:  Diagnosis Date  . Arthritis   . Complication of anesthesia   . Difficulty sleeping    DUE TO HIP PAIN  . Headache    MIGRAINES  . History of skin cancer   . Hypertension   . PONV (postoperative nausea and vomiting)    SEVERAL YRS AGO - NONE WITH MORE RECENT SURGERIES  . Skin cancer    BCC, SCC    Past Surgical History:  Procedure Laterality Date  . ABDOMINAL HYSTERECTOMY  1981   one ovary remains  . APPENDECTOMY    . SKIN CANCER EXCISION    . TONSILLECTOMY AND ADENOIDECTOMY    . TOTAL HIP ARTHROPLASTY Right 07/11/2011   HIP; [facility]  . TOTAL HIP ARTHROPLASTY Left 05/06/2015   Procedure: LEFT TOTAL HIP ARTHROPLASTY ANTERIOR APPROACH;  Surgeon: Paralee Cancel, MD;  Location: [facility];  Service: Orthopedics;  Laterality: Left;    Prior to Admission medications   Medication Sig Start Date End Date Taking? Authorizing  Provider  alendronate (FOSAMAX) 70 MG tablet Take 1 tablet (70 mg total) by mouth every 7 (seven) days. Take with a full glass of water on an empty stomach. 10/14/17  Yes Jeffery, Chelle, PA-C  Ascorbic Acid (VITAMIN C PO) Take 1 tablet by mouth every morning. Time Release - Emiliano Dyer.   Yes [provider]  CALCIUM PO Take 2 tablets by mouth every morning.   Yes [provider]  loratadine (CLARITIN) 10 MG tablet Take 10 mg by mouth every morning.   Yes [provider]  Magnesium 250 MG TABS Take 500 mg by mouth every morning.  [employer].   Yes [provider]  Multiple Vitamin (MULTIVITAMIN WITH MINERALS) TABS tablet Take 2 tablets by mouth every morning.   Yes [provider]  OVER THE COUNTER MEDICATION Take 2 tablets by mouth every morning. [place].   Yes [provider]  OVER THE COUNTER MEDICATION Take 2 tablets by mouth every morning. Skeletal Strength- Natural Sunshine.   Yes [provider]  SUMAtriptan (IMITREX) 100 MG tablet Take 1 as needed for migraineMay repeat in 2 hours if headache persists or recurs. 06/10/17  Yes Jeffery, Chelle, PA-C  acetaminophen (TYLENOL) 325 MG tablet Take 2 tablets (650 mg total) by mouth every 6 (six) hours as needed for mild pain (or Fever >/= 101). Patient not taking: Reported on 11/23/2016 05/06/15   Danae Orleans, PA-C    Current Facility-Administered Medications  Medication Dose Route Frequency Provider Last Rate Last Dose  . 0.9 %  sodium chloride infusion   Intravenous Continuous Amin, Ankit Chirag, MD 100 mL/hr at 03/02/18 0828    . insulin aspart (novoLOG) injection 0-9 Units  0-9 Units Subcutaneous TID WC Amin, Ankit Chirag, MD      . iopamidol (ISOVUE-300) 61 % injection           . ondansetron (ZOFRAN) injection 4 mg  4 mg Intravenous Q6H PRN Amin, Ankit Chirag, MD      . pantoprazole (PROTONIX) injection 40 mg  40 mg Intravenous Q12H Amin, Ankit Chirag, MD   40 mg at 03/02/18 0828  . promethazine (PHENERGAN) tablet 12.5 mg  12.5 mg Oral Q6H PRN Amin, Ankit Chirag, MD      . sodium chloride 0.9 % injection            Current Outpatient Medications  Medication Sig Dispense Refill  . alendronate (FOSAMAX) 70 MG tablet Take 1 tablet (70 mg total) by mouth every 7 (seven) days. Take with a full glass of water on an empty stomach. 12 tablet 3  . Ascorbic Acid (VITAMIN C PO) Take 1 tablet by mouth every morning. Time Release - Emiliano Dyer.    Marland Kitchen CALCIUM PO Take 2 tablets by mouth every morning.      . loratadine (CLARITIN) 10 MG tablet Take 10 mg by mouth every morning.    . Magnesium 250 MG TABS Take 500 mg by mouth every morning. [employer].    . Multiple Vitamin (MULTIVITAMIN WITH MINERALS) TABS tablet Take 2 tablets by mouth every morning.    Marland Kitchen OVER THE COUNTER MEDICATION Take 2 tablets by mouth every morning. [place].    Marland Kitchen OVER THE COUNTER MEDICATION Take 2 tablets by mouth every morning. Skeletal Strength- Natural Sunshine.    . SUMAtriptan (IMITREX) 100 MG tablet Take 1 as needed for migraineMay repeat in 2 hours if headache persists or recurs. 9 tablet 12  .  acetaminophen (TYLENOL) 325 MG tablet Take 2 tablets (650 mg total) by mouth every 6 (six) hours as needed for mild pain (or Fever >/= 101). (Patient not taking: Reported on 11/23/2016)      Allergies as of 03/01/2018 - Review Complete 03/01/2018  Allergen Reaction Noted  . Darvon [propoxyphene hcl]  04/10/2012  . Vicodin [hydrocodone-acetaminophen] Other (See Comments) 04/10/2012    Family History  Problem Relation Age of Onset  . Cancer Mother 20       breast cancer  . Cancer Maternal Grandmother        breast cancer  . Kidney disease Maternal Grandfather   . Arthritis Paternal Grandmother     Social History   Socioeconomic History  . Marital status: Single    Spouse name: n/a  . Number of children: 0  . Years of education: college  . Highest education level: Not on file  Occupational History  . Occupation: Aeronautical engineer: [employer]  . Occupation: IT consultant: [employer]  Social Needs  . Financial resource strain: Not on file  . Food insecurity:    Worry: Not on file    Inability: Not on file  . Transportation needs:    Medical: Not on file    Non-medical: Not on file  Tobacco Use  . Smoking status: Former Smoker    Last attempt to quit: 04/30/1994    Years since quitting: 23.8  . Smokeless tobacco:  Former Systems developer    Quit date: 03/09/1993  Substance and Sexual Activity  . Alcohol use: No    Alcohol/week: 0.0 oz  . Drug use: No  . Sexual activity: Never  Lifestyle  . Physical activity:    Days per week: Not on file    Minutes per session: Not on file  . Stress: Not on file  Relationships  . Social connections:    Talks on phone: Not on file    Gets together: Not on file    Attends religious service: Not on file    Active member of club or organization: Not on file    Attends meetings of clubs or organizations: Not on file    Relationship status: Not on file  . Intimate partner violence:    Fear of current or ex partner: Not on file    Emotionally abused: Not on file    Physically abused: Not on file    Forced sexual activity: Not on file  Other Topics Concern  . Not on file  Social History Narrative   Home Environment:Lives at home alone with her two dogs. One level home with a few stairs to the front and back doors.   Diet: Stays clear of carbohydrates and chocolate because they trigger headaches. Likes meat/chicken and vegetables. Generally healthy.   Exercise: Stays busy and active at work. Takes the stairs instead of the elevator.   Sleep: Significantly improved since hip replacement surgeries. Goes to bed around 9 and wakes up at 6. Able to work full days.   Work: Still enjoys working at [employer].   Urinary problems: No concerns. Hydrates well.      Brother lives in [place], [place]    Review of Systems: ROS is o/w negative except as mentioned in HPI.  Physical Exam: Vital signs in last 24 hours: Temp:  [97.9 F (36.6 C)-98 F (36.7 C)] 98 F (36.7 C) (03/20 2254) Pulse Rate:  [81-120] 97 (03/21 0800) Resp:  [12-18]  16 (03/21 0800) BP: (121-154)/(61-87) 130/72 (03/21 0800) SpO2:  [95 %-100 %] 97 % (03/21 0800) Weight:  [104 lb (47.2 kg)] 104 lb (47.2 kg) (03/20 2058)   General:  Alert, thin white female, in NAD.  Pleasant and cooperative. Head:  Normocephalic  and atraumatic. Eyes:  Sclera clear, no icterus.  Conjunctiva pink. Ears:  Normal auditory acuity. Mouth:  No deformity or lesions.   Lungs:  Clear throughout to auscultation.  No wheezes, crackles, or rhonchi.  Heart:  Regular rate and rhythm; no murmurs, clicks, rubs, or gallops. Abdomen:  Soft, non-distended.  BS present.  Non-tender. Rectal:  Deferred  Msk:  Symmetrical without gross deformities. Pulses:  Normal pulses noted. Extremities:  Without clubbing or edema. Neurologic:  Alert and oriented x 4;  grossly normal neurologically. Skin:  Intact without significant lesions or rashes. Psych:  Alert and cooperative. Normal mood and affect.  Intake/Output from previous day: 03/20 0701 - 03/21 0700 In: 1000 [IV Piggyback:1000] Out: -   Lab Results: Recent Labs    03/01/18 2330 03/02/18 0015  WBC 5.4  --   HGB 17.9* 9.2*  HCT 51.2* 27.4*  PLT 237  --    BMET Recent Labs    03/01/18 2330  NA 141  K 3.0*  CL 90*  CO2 33*  GLUCOSE 113*  BUN 33*  CREATININE 0.80  CALCIUM 9.2   LFT Recent Labs    03/01/18 2330  PROT 6.5  ALBUMIN 3.3*  AST 21  ALT 25  ALKPHOS 84  BILITOT 0.8   Studies/Results: Ct Abdomen Pelvis W Contrast  Result Date: 03/02/2018 CLINICAL DATA:  Hematemesis.  Nausea and vomiting. EXAM: CT ABDOMEN AND PELVIS WITH CONTRAST TECHNIQUE: Multidetector CT imaging of the abdomen and pelvis was performed using the standard protocol following bolus administration of intravenous contrast. CONTRAST:  80 cc Isovue-300 IV COMPARISON:  Radiograph earlier this day. FINDINGS: Lower chest: Linear atelectasis/scarring in both lower lobes. No consolidation or pleural fluid. Hepatobiliary: Scattered tiny hepatic hypodensities are too small to characterize, may be small cysts, hamartomas or hemangiomas. Calcified gallstone within minimally distended gallbladder. No pericholecystic inflammation. No biliary dilatation. Pancreas: Parenchymal atrophy. No ductal dilatation  or inflammation. Spleen: Normal in size without focal abnormality. Adrenals/Urinary Tract: Normal adrenal glands. No hydronephrosis or perinephric edema. Homogeneous renal enhancement with symmetric excretion on delayed phase imaging. Urinary bladder is physiologically distended, detailed evaluation obscured by streak artifact from bilateral hip arthroplasties. Stomach/Bowel: Mild-to-moderate gastric wall thickening about the cardia. There is moderate near circumferential wall thickening about the gastric body. No definite perigastric inflammation. 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 No other bowel inflammation or wall thickening. No obstruction. Streak artifact from bilateral hip arthroplasties partially limits evaluation of pelvic bowel loops. Vascular/Lymphatic: Mild aortic atherosclerosis. Incidental circumaortic left renal vein. No enlarged abdominal or pelvic lymph nodes, allowing for streak artifact in the pelvis. Reproductive: Post hysterectomy. No gross adnexal mass allowing for streak artifact. Other: No ascites or free air.  No intra-abdominal abscess. Musculoskeletal: Bilateral hip arthroplasties. There are no acute or suspicious osseous abnormalities. IMPRESSION: 1. Near circumferential gastric wall thickening about the body, differential considerations include gastritis, peptic ulcer disease, or gastric mass. Recommend correlation with endoscopy. There is also wall thickening about the gastric cardia. 2. Gallstone without gallbladder inflammation. 3. Tiny hepatic hypodensities which are too small to accurately characterize. 4.  Aortic Atherosclerosis (ICD10-I70.0). Electronically Signed   By: Threasa Beards  Ehinger M.D.   On: 03/02/2018 02:27   Dg Abd 2 Views  Result Date: 03/01/2018 CLINICAL DATA:  Right upper quadrant and epigastric tenderness and pain for over week, nausea and vomiting  EXAM: ABDOMEN - 2 VIEW COMPARISON:  None. FINDINGS: Supine and erect views the abdomen show no bowel obstruction. No free air is seen on the erect view. Of rounded metallic opacity in the midline of the lower chest on the initial view represents the patient's necklace which was moved on the second view. No opaque calculi are seen. 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 There are degenerative changes at the L5-S1 level. IMPRESSION: 1. No bowel obstruction.  No free air. 2. Bilateral total hip replacements. Electronically Signed   By: Ivar Drape M.D.   On: 03/01/2018 15:07   IMPRESSION:  *67 year old female with hematemesis x 2 and black, heme positive stool with about a 2 gram drop in Hgb after experiencing nausea and daily vomiting for several days.  Had been taking NSAID's for leg pain after a fracture and CT scan shows gastric wall thickening.  Also reports 10 pound weight loss over the past several days as well.  Rule out PUD vs gastritis vs malignancy.  PLAN: *EGD later this afternoon. *Will give another dose of pantoprazole and a dose of zofran now.   Laban Emperor. Zehr  03/02/2018, 8:55 AM  Pager number [phone]  I have reviewed the entire case in detail with the above APP and discussed the plan in detail.  Therefore, I agree with the diagnoses recorded above. In addition,  I have personally interviewed and examined the patient and have personally reviewed any abdominal/pelvic CT scan images.  My additional thoughts are as follows:  NSAID use raises suspicion for GU, but weight loss is bothersome, especially with CT findings. Gallstone appears to be an incidental finding.  She is stable and EGD is next step for evaluation.  She is agreeable after discussion of procedure and risks.   The benefits and risks of the planned procedure were described in detail with the patient or (when appropriate) their health care proxy.  Risks were outlined as including, but not limited to, bleeding,  infection, perforation, adverse medication reaction leading to cardiac or pulmonary decompensation, or pancreatitis (if ERCP).  The limitation of incomplete mucosal visualization was also discussed.  No guarantees or warranties were given.   Nelida Meuse III Pager [phone]  Mon-Fri 8a-5p [phone] after 5p, weekends, holidays

## 2018-03-02 NOTE — Anesthesia Postprocedure Evaluation (Signed)
Anesthesia Post Note  Patient: Carol King  Procedure(s) Performed: ESOPHAGOGASTRODUODENOSCOPY (EGD) WITH PROPOFOL (N/A )     Patient location during evaluation: Endoscopy Anesthesia Type: MAC Level of consciousness: awake Pain management: pain level controlled Vital Signs Assessment: post-procedure vital signs reviewed and stable Respiratory status: spontaneous breathing Cardiovascular status: stable Anesthetic complications: no    Last Vitals:  Vitals Value Taken Time  BP    Temp    Pulse    Resp    SpO2      Last Pain:  Vitals:   03/02/18 1440  TempSrc:   PainSc: 0-No pain                 [name]     

## 2018-03-03 ENCOUNTER — Encounter (HOSPITAL_COMMUNITY): Payer: Self-pay | Admitting: Gastroenterology

## 2018-03-03 DIAGNOSIS — K922 Gastrointestinal hemorrhage, unspecified: Secondary | ICD-10-CM

## 2018-03-03 LAB — GLUCOSE, CAPILLARY
GLUCOSE-CAPILLARY: 102 mg/dL — AB (ref 65–99)
GLUCOSE-CAPILLARY: 123 mg/dL — AB (ref 65–99)
GLUCOSE-CAPILLARY: 124 mg/dL — AB (ref 65–99)
GLUCOSE-CAPILLARY: 141 mg/dL — AB (ref 65–99)
Glucose-Capillary: 103 mg/dL — ABNORMAL HIGH (ref 65–99)
Glucose-Capillary: 110 mg/dL — ABNORMAL HIGH (ref 65–99)

## 2018-03-03 LAB — COMPREHENSIVE METABOLIC PANEL
ALBUMIN: 2.2 g/dL — AB (ref 3.5–5.0)
ALT: 20 U/L (ref 14–54)
ANION GAP: 7 (ref 5–15)
AST: 18 U/L (ref 15–41)
Alkaline Phosphatase: 56 U/L (ref 38–126)
BILIRUBIN TOTAL: 0.4 mg/dL (ref 0.3–1.2)
BUN: 18 mg/dL (ref 6–20)
CO2: 28 mmol/L (ref 22–32)
Calcium: 8.5 mg/dL — ABNORMAL LOW (ref 8.9–10.3)
Chloride: 104 mmol/L (ref 101–111)
Creatinine, Ser: 0.6 mg/dL (ref 0.44–1.00)
GFR calc non Af Amer: >60 mL/min (ref >60)
Glucose, Bld: 119 mg/dL — ABNORMAL HIGH (ref 65–99)
POTASSIUM: 3.3 mmol/L — AB (ref 3.5–5.1)
Sodium: 139 mmol/L (ref 135–145)
TOTAL PROTEIN: 4.5 g/dL — AB (ref 6.5–8.1)

## 2018-03-03 LAB — APTT: aPTT: 30 seconds (ref 24–36)

## 2018-03-03 LAB — CBC
HEMATOCRIT: 24 % — AB (ref 36.0–46.0)
Hemoglobin: 8 g/dL — ABNORMAL LOW (ref 12.0–15.0)
MCH: 28.4 pg (ref 26.0–34.0)
MCHC: 33.3 g/dL (ref 30.0–36.0)
MCV: 85.1 fL (ref 78.0–100.0)
Platelets: 234 10*3/uL (ref 150–400)
RBC: 2.82 MIL/uL — AB (ref 3.87–5.11)
RDW: 13.9 % (ref 11.5–15.5)
WBC: 5.8 10*3/uL (ref 4.0–10.5)

## 2018-03-03 LAB — FOLATE RBC
FOLATE, HEMOLYSATE: 441.1 ng/mL
Folate, RBC: 1646 ng/mL (ref >498)
HEMATOCRIT: 26.8 % — AB (ref 34.0–46.6)

## 2018-03-03 LAB — MAGNESIUM: Magnesium: 1.6 mg/dL — ABNORMAL LOW (ref 1.7–2.4)

## 2018-03-03 MED ORDER — MAGNESIUM SULFATE 2 GM/50ML IV SOLN
2.0000 g | Freq: Once | INTRAVENOUS | Status: AC
  Administered 2018-03-03: 2 g via INTRAVENOUS
  Filled 2018-03-03: qty 50

## 2018-03-03 MED ORDER — PANTOPRAZOLE SODIUM 40 MG PO PACK
40.0000 mg | PACK | Freq: Two times a day (BID) | ORAL | Status: DC
  Administered 2018-03-04: 40 mg via ORAL
  Filled 2018-03-03 (×2): qty 20

## 2018-03-03 MED ORDER — POTASSIUM CHLORIDE CRYS ER 20 MEQ PO TBCR
40.0000 meq | EXTENDED_RELEASE_TABLET | Freq: Once | ORAL | Status: AC
  Administered 2018-03-03: 40 meq via ORAL
  Filled 2018-03-03: qty 2

## 2018-03-03 MED ORDER — SODIUM CHLORIDE 0.9 % IV SOLN
125.0000 mg | Freq: Once | INTRAVENOUS | Status: AC
  Administered 2018-03-03: 125 mg via INTRAVENOUS
  Filled 2018-03-03: qty 10

## 2018-03-03 NOTE — Progress Notes (Addendum)
Initial Nutrition Assessment  DOCUMENTATION CODES:   Not applicable  INTERVENTION:   Magic cup TID with meals, each supplement provides 290 kcal and 9 grams of protein   NUTRITION DIAGNOSIS:   Increased nutrient needs related to acute illness as evidenced by estimated needs.   GOAL:   Patient will meet greater than or equal to 90% of their needs   MONITOR:   PO intake, Supplement acceptance, Diet advancement, Labs, Weight trends  REASON FOR ASSESSMENT:   Other (Comment)(Low BMI)    ASSESSMENT:   67 yo female admitted 3/20 with hematemesis, occult stool for diagnostic EGD. History significant of basal cell carcinoma (not currently on treatment), osteoporosis, hyperthyroidism, migraine, right tibial fracture with cast in place, fibromyalgia   EGD was completed 3/21; attended MD follow-up meeting with pt-- results of EGD suggest GI bleeding d/t NSAID use. He plans to keep her one more day for observation since her Hgb is low.  Spoke with pt:   Appetite prior to current illness was really good; pt is very active and energetic; she stated that she is feeling much better  Percent PO intake improving; pt states that she is hungry  Denies issues with chewing/swallowing  N/V not present   Diarrhea persists today, but less frequent   As of today, last BM 8am  Pt talked through her plans after admission to get adequate kcal/protein since she is going to remain on full liquids. She likes [employer], Ensure makes her nauseous.  In acute setting of current encounter, pt has lost weight due to poor appetite/N/V over past 10 days. Pt UBW does not place her in the Low BMI classification.  Prior to current illness, pt's weight has remained relatively stable according to medical record. Pt reported that her wt has been stable prior to recent illness as well.  Wt Readings from Last 15 Encounters:  03/03/18 103 lb 9.9 oz (47 kg)  06/10/17 108 lb 6.4 oz (49.2 kg)  11/23/16  108 lb (49 kg)  06/08/16 109 lb 6.4 oz (49.6 kg)  05/06/15 105 lb (47.6 kg)  05/01/15 109 lb (49.4 kg)  03/10/15 106 lb 9.6 oz (48.4 kg)  09/18/14 117 lb 3.2 oz (53.2 kg)  07/15/14 109 lb 12.8 oz (49.8 kg)  03/30/14 111 lb (50.3 kg)  03/26/14 118 lb (53.5 kg)  01/03/14 111 lb 3.2 oz (50.4 kg)  04/19/13 116 lb (52.6 kg)  04/10/12 117 lb (53.1 kg)     Medications: insulin, protonix, NaCl  @ 100 ml/hr  Labs:  CBG's 115, 141, 103 K 3.3 L   NUTRITION - FOCUSED PHYSICAL EXAM:    Most Recent Value  Orbital Region  No depletion  Upper Arm Region  No depletion  Thoracic and Lumbar Region  No depletion  Buccal Region  No depletion  Temple Region  No depletion  Clavicle Bone Region  Mild depletion  Clavicle and Acromion Bone Region  No depletion  Scapular Bone Region  No depletion  Dorsal Hand  Mild depletion  Patellar Region  No depletion  Anterior Thigh Region  No depletion  Posterior Calf Region  No depletion [broken leg]  Edema (RD Assessment)  None  Hair  Reviewed  Eyes  Reviewed  Mouth  Reviewed  Skin  Reviewed  Nails  Reviewed       Diet Order:  Diet full liquid Room service appropriate? Yes; Fluid consistency: Thin  EDUCATION NEEDS:   Education needs have been addressed  Skin:  Skin Assessment: Reviewed  RN Assessment  Last BM:  3/21; diarrhea  Height:   Ht Readings from Last 1 Encounters:  03/01/18 5\' 3"  (1.6 m)    Weight:   Wt Readings from Last 1 Encounters:  03/03/18 103 lb 9.9 oz (47 kg)    Ideal Body Weight:  52.2 kg  BMI:  Body mass index is 18.35 kg/m.  Estimated Nutritional Needs:   Kcal:  1300-1500 kcal (28-32 kcal/kg)  Protein:  65-75 grams (20% kcal)  Fluid:  >= 1.3 L/day    Edmonia Lynch Dietetic Intern Pager: [phone]

## 2018-03-03 NOTE — Progress Notes (Signed)
PROGRESS NOTE [facility]   Carol King   NFA:[id number] DOB: Jun 30, 1951  DOA: 03/01/2018 PCP: Harrison Mons, PA-C   Brief Narrative:  Carol King is a 67 year old female with medical history significant for OA, hyper thyroidism, migraine and fibromyalgia who had a recent right radial fracture.  Patient presented to the emergency room complaining of vomiting blood.  She report recent use of NSAIDs. In the ED she had an hematemesis episode.  Hemoglobin was noted to be 17.9 and on repeat felt to be laparotomy on repeat was 9.2 which was a drop on hemoglobin from 2 days ago at 11.2.  Patient was admitted with working diagnosis of upper GI bleed.  Patient was started on IV Protonix and GI was consulted.  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  This was thought to be related to NSAID use.   Subjective: Patient seen and examined, doing well has no complaints this morning.  Her hemoglobin dropped 1 g from yesterday.  She report no vomiting or overt bleeding.  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  Hemoglobin this morning dropped 1 g from yesterday.  IV iron x1 given today.  No signs of overt bleeding.  Will monitor CBC overnight and if hemoglobin stable will discharge in the morning.   Upper GI bleed/duodenal stricture/gastric ulcer Felt to be secondary to NSAID use, patient also with duodenal stenosis and severe stricture confirmed by upper GI series. GI recommending patient to continue full liquid diet and protein calorie supplements until EGD can be repeated to attempt dilation of stricture.  Gastric biopsy was obtained on pending.  Continue PPI twice  daily  Hypokalemia/hypomagnesemia Replete Check BMP and magnesium in a.m.  Right foot tibial fracture Cast in place, stable  DVT prophylaxis: SCDs ambulation Code Status: Full code Family Communication: None at bedside Disposition Plan: Home in a.m. if hemoglobin stable  Consultants:   GI  Procedures:   EGD  Antimicrobials:  None   Objective: Vitals:   03/02/18 2003 03/03/18 0420 03/03/18 1100 03/03/18 1400  BP: (!) 125/58 (!) 104/52 118/66 117/60  Pulse: 88 90 87 94  Resp: (!) 21 20 19    Temp: 98.6 F (37 C) 98.4 F (36.9 C) 97.9 F (36.6 C) 98.4 F (36.9 C)  TempSrc: Oral Oral Oral Oral  SpO2: 97% 97% 99% 100%  Weight:  47 kg (103 lb 9.9 oz)    Height:        Intake/Output Summary (Last 24 hours) at 03/03/2018 1624 Last data filed at 03/03/2018 1500 Gross per 24 hour  Intake 1330 ml  Output -  Net 1330 ml   Filed Weights   03/01/18 2058 03/03/18 0420  Weight: 47.2 kg (104 lb) 47 kg (103 lb 9.9 oz)    Examination:  General: Pt is alert, awake, not in acute distress Cardiovascular: RRR, S1/S2 +, no rubs, no gallops Respiratory: CTA bilaterally, no wheezing, no rhonchi Abdominal: Soft, NT, ND, bowel sounds + Extremities: Right cast in place, pedal pulses intact, no edema   Data Reviewed: I have personally reviewed following labs and imaging studies  CBC: Recent Labs  Lab 03/01/18 2330 03/02/18 0015 03/02/18 1000 03/03/18 0551  WBC 5.4  --   --  5.8  NEUTROABS 2.9  --   --   --   HGB 17.9* 9.2*  --  8.0*  HCT 51.2* 27.4* 26.8* 24.0*  MCV 82.8  --   --  85.1  PLT 237  --   --  161   Basic Metabolic Panel: Recent Labs  Lab 03/01/18 2330 03/03/18 0551  NA 141 139  K 3.0* 3.3*  CL 90* 104  CO2 33* 28  GLUCOSE 113* 119*  BUN 33* 18  CREATININE 0.80 0.60  CALCIUM 9.2 8.5*  MG  --  1.6*   GFR: Estimated Creatinine Clearance: 49.9 mL/min (by C-G formula based on SCr of 0.6 mg/dL). Liver Function Tests: Recent Labs  Lab  03/01/18 2330 03/03/18 0551  AST 21 18  ALT 25 20  ALKPHOS 84 56  BILITOT 0.8 0.4  PROT 6.5 4.5*  ALBUMIN 3.3* 2.2*   Recent Labs  Lab 03/01/18 2330  LIPASE 33   No results for input(s): AMMONIA in the last 168 hours. Coagulation Profile: No results for input(s): INR, PROTIME in the last 168 hours. Cardiac Enzymes: No results for input(s): CKTOTAL, CKMB, CKMBINDEX, TROPONINI in the last 168 hours. BNP (last 3 results) No results for input(s): PROBNP in the last 8760 hours. HbA1C: No results for input(s): HGBA1C in the last 72 hours. CBG: Recent Labs  Lab 03/02/18 1913 03/02/18 2348 03/03/18 0415 03/03/18 0822 03/03/18 1135  GLUCAP 120* 115* 141* 103* 123*   Lipid Profile: No results for input(s): CHOL, HDL, LDLCALC, TRIG, CHOLHDL, LDLDIRECT in the last 72 hours. Thyroid Function Tests: No results for input(s): TSH, T4TOTAL, FREET4, T3FREE, THYROIDAB in the last 72 hours. Anemia Panel: Recent Labs    03/02/18 0953  VITAMINB12 4,799*  FERRITIN 33  TIBC 252  IRON 20*   Sepsis Labs: No results for input(s): PROCALCITON, LATICACIDVEN in the last 168 hours.  No results found for this or any previous visit (from the past 240 hour(s)).    Radiology Studies: Ct Abdomen Pelvis W Contrast  Result Date: 03/02/2018 CLINICAL DATA:  Hematemesis.  Nausea and vomiting. EXAM: CT ABDOMEN AND PELVIS WITH CONTRAST TECHNIQUE: Multidetector CT imaging of the abdomen and pelvis was performed using the standard protocol following bolus administration of intravenous contrast. CONTRAST:  80 cc Isovue-300 IV COMPARISON:  Radiograph earlier this day. FINDINGS: Lower chest: Linear atelectasis/scarring in both lower lobes. No consolidation or pleural fluid. Hepatobiliary: Scattered tiny hepatic hypodensities are too small to characterize, may be small cysts, hamartomas or hemangiomas. Calcified gallstone within minimally distended gallbladder. No pericholecystic inflammation. No biliary  dilatation. Pancreas: Parenchymal atrophy. No ductal dilatation or inflammation. Spleen: Normal in size without focal abnormality. Adrenals/Urinary Tract: Normal adrenal glands. No hydronephrosis or perinephric edema. Homogeneous renal enhancement with symmetric excretion on delayed phase imaging. Urinary bladder is physiologically distended, detailed evaluation obscured by streak artifact from bilateral hip arthroplasties. Stomach/Bowel: Mild-to-moderate gastric wall thickening about the cardia. There is moderate near circumferential wall thickening about the gastric body. No definite perigastric inflammation. 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 No other bowel inflammation or wall thickening. No obstruction. Streak artifact from bilateral hip arthroplasties partially limits evaluation of pelvic bowel loops. Vascular/Lymphatic: Mild aortic atherosclerosis. Incidental circumaortic left renal vein. No enlarged abdominal or pelvic lymph nodes, allowing for streak artifact in the pelvis. Reproductive: Post hysterectomy. No gross adnexal mass allowing for streak artifact. Other: No ascites or free air.  No intra-abdominal abscess. Musculoskeletal:  Bilateral hip arthroplasties. There are no acute or suspicious osseous abnormalities. IMPRESSION: 1. Near circumferential gastric wall thickening about the body, differential considerations include gastritis, peptic ulcer disease, or gastric mass. Recommend correlation with endoscopy. There is also wall thickening about the gastric cardia. 2. Gallstone without gallbladder inflammation. 3. Tiny hepatic hypodensities which are too small to accurately characterize. 4.  Aortic Atherosclerosis (ICD10-I70.0). Electronically Signed   By: Jeb Levering M.D.   On: 03/02/2018 02:27   Dg Duanne Limerick W/water Sol Cm  Addendum Date: 03/02/2018   ADDENDUM REPORT: 03/02/2018 17:38  ADDENDUM: Study discussed by telephone with Dr. Hilarie Fredrickson, covergin for Dr. Wilfrid Lund III) on 03/02/2018 at 1735 hours. Electronically Signed   By: Genevie Ann M.D.   On: 03/02/2018 17:38   Result Date: 03/02/2018 CLINICAL DATA:  67 year old female with high-grade stricture in the proximal duodenum encountered on endoscopy today. The stenosis was unable to be traversed by adult or pediatric endoscope. EXAM: WATER SOLUBLE UPPER GI SERIES TECHNIQUE: Single-column upper GI series was performed using water soluble contrast. CONTRAST:  147mL ISOVUE-300 IOPAMIDOL (ISOVUE-300) INJECTION 61% COMPARISON:  CT Abdomen and Pelvis 03/02/2018. Report of EGD earlier today FLUOROSCOPY TIME:  Fluoroscopy Time:  2 minutes 48 seconds Radiation Exposure Index (if provided by the fluoroscopic device): 35.3 mGy Number of Acquired Spot Images: 0 FINDINGS: Preprocedural scout view of the abdomen. Oral contrast from the CT Abdomen and Pelvis at 0158 hours today is now present throughout the large bowel. Non obstructed bowel gas pattern. Negative lung bases. A single contrast study was undertaken with water-soluble contrast and the patient tolerated this well and without difficulty. No obstruction to the forward flow of contrast throughout the esophagus and into the stomach. Normal esophageal course and contour. Mild circumferential narrowing at the gastroesophageal junction might correspond to a Schatzki ring described on the endoscopy today (series 11). Prompt transit of contrast from proximal to the distal stomach and into the duodenum bulb which is dilated (series 7). The gastric pylorus seem to be continuously patent throughout the study. The patient was positioned RPO, and small volumes of contrast began to traverse a short segment severe stenosis. The stenosis length is estimated at 5-6 millimeters (series 26). To and fro motion of contrast in the stomach and duodenum bulb occurred throughout this time. Occasionally, a small round  sacculation was opacified in conjunction with the stenosis and this is seen on series 30 cine images (image 88). Compare this appearance to sagittal image 36 of the earlier CT Abdomen and Pelvis which demonstrates the short segment stenosis (4 millimeters) and lateral to it a small linear appearing sacculation (image 34). Over the course of 7 minutes only a small volume of the oral contrast had traversed the stricture. IMPRESSION: 1. Short-segment very severe stricture in the proximal duodenum just distal to a dilated duodenum bulb. The stricture appears to be less than 5-6 mm in length, and has a small disc like sacculation associated. 2. Compared the images on this exam to sagittal images 34 through 36 on the CT Abdomen and Pelvis earlier today. Electronically Signed: By: Genevie Ann M.D. On: 03/02/2018 17:18      Scheduled Meds: . feeding supplement  1 Container Oral TID BM  . insulin aspart  0-9 Units Subcutaneous TID WC  . ondansetron (ZOFRAN) IV  4 mg Intravenous Q6H  . pantoprazole sodium  40 mg Oral BID AC   Continuous Infusions:   LOS: 1 day    Time spent: Total of 15 minutes  spent with pt, greater than 50% of which was spent in discussion of  treatment, counseling and coordination of care  Chipper Oman, MD Pager: Text Page via [email or web address]   If 7PM-7AM, please contact night-coverage [email or web address] 03/03/2018, 4:24 PM   Note - This record has been created using [employer]. Chart creation errors have been sought, but may not always have been located. Such creation errors do not reflect on the standard of medical care.

## 2018-03-03 NOTE — Progress Notes (Addendum)
[facility] Progress Note  CC:  Hematemesis  Subjective:  Feels good.  Tolerating full liquids.  Having liquids stools, initial one was dark, but they have lightened up now.  No further sign of GI bleeding.  EGD on 3/21 showed the following:  - Widely patent Schatzki ring. - Non-bleeding gastric ulcers with no stigmata of bleeding. - Acquired duodenal stenosis. - Multiple biopsies were obtained in the gastric body and in the gastric antrum.  This all appears to be NSAID-related.   UGI last evening showed the following:  IMPRESSION: 1. Short-segment very severe stricture in the proximal duodenum just distal to a dilated duodenum bulb. The stricture appears to be less than 5-6 mm in length, and has a small disc like sacculation associated. 2. Compared the images on this exam to sagittal images 34 through 36 on the CT Abdomen and Pelvis earlier today.   Objective:  Vital signs in last 24 hours: Temp:  [97.6 F (36.4 C)-98.7 F (37.1 C)] 98.4 F (36.9 C) (03/22 0420) Pulse Rate:  [88-106] 90 (03/22 0420) Resp:  [16-22] 20 (03/22 0420) BP: (104-141)/(46-62) 104/52 (03/22 0420) SpO2:  [97 %-100 %] 97 % (03/22 0420) Weight:  [103 lb 9.9 oz (47 kg)] 103 lb 9.9 oz (47 kg) (03/22 0420) Last BM Date: 03/02/18 General:  Alert, thin, in NAD Heart:  Regular rate and rhythm; no murmurs Pulm:  CTAB.  No increased WOB. Abdomen:  Soft, non-distended.  BS present and hyperactive.  Non-tender. Extremities:  Without edema.  Cast noted on right leg. Neurologic:  Alert and oriented x 4;  grossly normal neurologically. Psych:  Alert and cooperative. Normal mood and affect.  Intake/Output from previous day: 03/21 0701 - 03/22 0700 In: 1524.7 [I.V.:1524.7] Out: 200 [Urine:200]  Lab Results: Recent Labs    03/01/18 2330 03/02/18 0015 03/03/18 0551  WBC 5.4  --  5.8  HGB 17.9* 9.2* 8.0*  HCT 51.2* 27.4* 24.0*  PLT 237  --  234   BMET Recent Labs    03/01/18 2330  03/03/18 0551  NA 141 139  K 3.0* 3.3*  CL 90* 104  CO2 33* 28  GLUCOSE 113* 119*  BUN 33* 18  CREATININE 0.80 0.60  CALCIUM 9.2 8.5*   LFT Recent Labs    03/03/18 0551  PROT 4.5*  ALBUMIN 2.2*  AST 18  ALT 20  ALKPHOS 56  BILITOT 0.4   Ct Abdomen Pelvis W Contrast  Result Date: 03/02/2018 CLINICAL DATA:  Hematemesis.  Nausea and vomiting. EXAM: CT ABDOMEN AND PELVIS WITH CONTRAST TECHNIQUE: Multidetector CT imaging of the abdomen and pelvis was performed using the standard protocol following bolus administration of intravenous contrast. CONTRAST:  80 cc Isovue-300 IV COMPARISON:  Radiograph earlier this day. FINDINGS: Lower chest: Linear atelectasis/scarring in both lower lobes. No consolidation or pleural fluid. Hepatobiliary: Scattered tiny hepatic hypodensities are too small to characterize, may be small cysts, hamartomas or hemangiomas. Calcified gallstone within minimally distended gallbladder. No pericholecystic inflammation. No biliary dilatation. Pancreas: Parenchymal atrophy. No ductal dilatation or inflammation. Spleen: Normal in size without focal abnormality. Adrenals/Urinary Tract: Normal adrenal glands. No hydronephrosis or perinephric edema. Homogeneous renal enhancement with symmetric excretion on delayed phase imaging. Urinary bladder is physiologically distended, detailed evaluation obscured by streak artifact from bilateral hip arthroplasties. Stomach/Bowel: Mild-to-moderate gastric wall thickening about the cardia. There is moderate near circumferential wall thickening about the gastric body. No definite perigastric inflammation. 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 No other bowel inflammation or wall thickening. No obstruction. Streak artifact from bilateral hip arthroplasties partially limits evaluation of pelvic bowel loops. Vascular/Lymphatic: Mild aortic  atherosclerosis. Incidental circumaortic left renal vein. No enlarged abdominal or pelvic lymph nodes, allowing for streak artifact in the pelvis. Reproductive: Post hysterectomy. No gross adnexal mass allowing for streak artifact. Other: No ascites or free air.  No intra-abdominal abscess. Musculoskeletal: Bilateral hip arthroplasties. There are no acute or suspicious osseous abnormalities. IMPRESSION: 1. Near circumferential gastric wall thickening about the body, differential considerations include gastritis, peptic ulcer disease, or gastric mass. Recommend correlation with endoscopy. There is also wall thickening about the gastric cardia. 2. Gallstone without gallbladder inflammation. 3. Tiny hepatic hypodensities which are too small to accurately characterize. 4.  Aortic Atherosclerosis (ICD10-I70.0). Electronically Signed   By: Jeb Levering M.D.   On: 03/02/2018 02:27   Dg Abd 2 Views  Result Date: 03/01/2018 CLINICAL DATA:  Right upper quadrant and epigastric tenderness and pain for over week, nausea and vomiting EXAM: ABDOMEN - 2 VIEW COMPARISON:  None. FINDINGS: Supine and erect views the abdomen show no bowel obstruction. No free air is seen on the erect view. Of rounded metallic opacity in the midline of the lower chest on the initial view represents the patient's necklace which was moved on the second view. No opaque calculi are seen. 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 There are degenerative changes at the L5-S1 level. IMPRESSION: 1. No bowel obstruction.  No free air. 2. Bilateral total hip replacements. Electronically Signed   By: Ivar Drape M.D.   On: 03/01/2018 15:07   Dg Duanne Limerick W/water Sol Cm  Addendum Date: 03/02/2018   ADDENDUM REPORT: 03/02/2018 17:38 ADDENDUM: Study discussed by telephone with Dr. Hilarie Fredrickson, covergin for Dr. Wilfrid Lund III) on 03/02/2018 at 1735 hours. Electronically Signed   By: Genevie Ann M.D.   On: 03/02/2018 17:38   Result Date: 03/02/2018 CLINICAL DATA:   67 year old female with high-grade stricture in the proximal duodenum encountered on endoscopy today. The stenosis was unable to be traversed by adult or pediatric endoscope. EXAM: WATER SOLUBLE UPPER GI SERIES TECHNIQUE: Single-column upper GI series was performed using water soluble contrast. CONTRAST:  175mL ISOVUE-300 IOPAMIDOL (ISOVUE-300) INJECTION 61% COMPARISON:  CT Abdomen and Pelvis 03/02/2018. Report of EGD earlier today FLUOROSCOPY TIME:  Fluoroscopy Time:  2 minutes 48 seconds Radiation Exposure Index (if provided by the fluoroscopic device): 35.3 mGy Number of Acquired Spot Images: 0 FINDINGS: Preprocedural scout view of the abdomen. Oral contrast from the CT Abdomen and Pelvis at 0158 hours today is now present throughout the large bowel. Non obstructed bowel gas pattern. Negative lung bases. A single contrast study was undertaken with water-soluble contrast and the patient tolerated this well and without difficulty. No obstruction to the forward flow of contrast throughout the esophagus and into the stomach. Normal esophageal course and contour. Mild circumferential narrowing at the gastroesophageal junction might correspond to a Schatzki ring described on the endoscopy today (series 11). Prompt transit of contrast from proximal to the distal stomach and into the duodenum bulb which is dilated (series 7). The gastric pylorus seem to be continuously patent throughout the study. The patient was positioned RPO, and small volumes of contrast began to traverse a short segment severe stenosis. The stenosis length is estimated at 5-6 millimeters (series 26). To and fro motion of contrast in the stomach and duodenum bulb occurred throughout this time.  Occasionally, a small round sacculation was opacified in conjunction with the stenosis and this is seen on series 30 cine images (image 88). Compare this appearance to sagittal image 36 of the earlier CT Abdomen and Pelvis which demonstrates the short segment  stenosis (4 millimeters) and lateral to it a small linear appearing sacculation (image 34). Over the course of 7 minutes only a small volume of the oral contrast had traversed the stricture. IMPRESSION: 1. Short-segment very severe stricture in the proximal duodenum just distal to a dilated duodenum bulb. The stricture appears to be less than 5-6 mm in length, and has a small disc like sacculation associated. 2. Compared the images on this exam to sagittal images 34 through 36 on the CT Abdomen and Pelvis earlier today. Electronically Signed: By: Genevie Ann M.D. On: 03/02/2018 17:18   Assessment / Plan: *67 year old female with hematemesis x 2 and black, heme positive stool with about a 2 gram drop in Hgb after experiencing nausea and daily vomiting for several days.  Had been taking NSAID's for leg pain after a fracture and CT scan shows gastric wall thickening.  Also reports 10 pound weight loss over the past several days as well.  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  This is thought to be all NSAID induced.  Hgb is down about a gram but no evidence of bleeding and she is still on 100 cc/hr of IV fluids so it is likely dilutional.  Is tolerating full liquids.  Anticipate discharge later today (after seen by Dr. Loletha Carrow) on full liquid diet with multiple protein drinks per day.  Will give OV follow-up and she will need a repeat EGD.  Will plan to recheck Hgb as outpatient next week.    LOS: 1 day   Carol King  03/03/2018, 8:58 AM  Pager number [phone]  Hematemesis Gastric ulcers, chronic (with bleeding upon admission) Weight loss Duodenal stricture  I have discussed the case with the PA, and that is the plan I formulated. I personally interviewed and examined the patient.  No further hematemesis since admission. Tolerating full liquids and protein calorie supplements well.  She will need to stay on that  until repeat EGD in 2-3 weeks to attempt dilation of stricture. IV iron today.  Changing PPI to oral solution.  I am concerned tablets may not be absorbed well with duodenal stricture.  She is agreeable to this plan.  Will communicate to [facility] doc, who plans to keep her until tomorrow.  She can go home any time from a GI perspective, and my office will contact her next week with plans for outpatient EGD. I will also follow up gastric Bx result next week.   Total time 25 minutes, over half spent face to face with patient and in coordination of care. Nelida Meuse III Pager [phone]  Mon-Fri 8a-5p [phone] after 5p, weekends, holidays

## 2018-03-04 ENCOUNTER — Other Ambulatory Visit: Payer: Self-pay

## 2018-03-04 LAB — CBC WITH DIFFERENTIAL/PLATELET
Basophils Absolute: 0 10*3/uL (ref 0.0–0.1)
Basophils Relative: 0 %
EOS PCT: 2 %
Eosinophils Absolute: 0.1 10*3/uL (ref 0.0–0.7)
HEMATOCRIT: 25.3 % — AB (ref 36.0–46.0)
Hemoglobin: 8.4 g/dL — ABNORMAL LOW (ref 12.0–15.0)
LYMPHS ABS: 2.7 10*3/uL (ref 0.7–4.0)
LYMPHS PCT: 56 %
MCH: 28.7 pg (ref 26.0–34.0)
MCHC: 33.2 g/dL (ref 30.0–36.0)
MCV: 86.3 fL (ref 78.0–100.0)
MONO ABS: 0.5 10*3/uL (ref 0.1–1.0)
MONOS PCT: 11 %
NEUTROS ABS: 1.5 10*3/uL — AB (ref 1.7–7.7)
Neutrophils Relative %: 31 %
PLATELETS: 246 10*3/uL (ref 150–400)
RBC: 2.93 MIL/uL — ABNORMAL LOW (ref 3.87–5.11)
RDW: 14.1 % (ref 11.5–15.5)
WBC: 4.9 10*3/uL (ref 4.0–10.5)

## 2018-03-04 LAB — GLUCOSE, CAPILLARY
Glucose-Capillary: 99 mg/dL (ref 65–99)
Glucose-Capillary: 108 mg/dL — ABNORMAL HIGH (ref 65–99)

## 2018-03-04 LAB — BASIC METABOLIC PANEL
Anion gap: 7 (ref 5–15)
BUN: 10 mg/dL (ref 6–20)
CHLORIDE: 109 mmol/L (ref 101–111)
CO2: 26 mmol/L (ref 22–32)
Calcium: 9 mg/dL (ref 8.9–10.3)
Creatinine, Ser: 0.63 mg/dL (ref 0.44–1.00)
GFR calc Af Amer: >60 mL/min (ref >60)
GFR calc non Af Amer: >60 mL/min (ref >60)
GLUCOSE: 108 mg/dL — AB (ref 65–99)
POTASSIUM: 4.8 mmol/L (ref 3.5–5.1)
Sodium: 142 mmol/L (ref 135–145)

## 2018-03-04 LAB — MAGNESIUM: Magnesium: 1.7 mg/dL (ref 1.7–2.4)

## 2018-03-04 MED ORDER — POLYETHYLENE GLYCOL 3350 17 G PO PACK: 17.0000 g | PACK | Freq: Every day | ORAL | 0 refills | Status: DC

## 2018-03-04 MED ORDER — PANTOPRAZOLE SODIUM 40 MG PO PACK: 40.0000 mg | PACK | Freq: Two times a day (BID) | ORAL | 0 refills | Status: DC

## 2018-03-04 MED ORDER — POLYSACCHARIDE IRON COMPLEX 150 MG PO CAPS: 150.0000 mg | ORAL_CAPSULE | Freq: Every day | ORAL | 0 refills | Status: DC

## 2018-03-04 NOTE — Discharge Summary (Signed)
Physician Discharge Summary  Carol King  ZOX:[id number]  DOB: Jan 01, 1951  DOA: 03/01/2018 PCP: Harrison Mons, PA-C  Admit date: 03/01/2018 Discharge date: 03/04/2018  Admitted From: Home  Disposition: Home   Recommendations for Outpatient Follow-up:  1. Follow up with PCP in 1 week  2. Please obtain BMP/CBC in one week to monitor hemoglobin and renal function 3. Full liquids for 2-3 weeks until seen by GI  4. Plan for EGD in 2-3 weeks  Discharge Condition: Stable CODE STATUS: Full code Diet recommendation: Full liquids  Brief/Interim Summary: For full details see H&P/Progress note, but in brief, Carol King is a 67 year old female with medical history significant for OA, hyper thyroidism, migraine and fibromyalgia who had a recent right radial fracture.  Patient presented to the emergency room complaining of vomiting blood.  She report recent use of NSAIDs. In the ED she had an hematemesis episode.  Hemoglobin was noted to be 17.9 and on repeat felt to be laparotomy on repeat was 9.2 which was a drop on hemoglobin from 2 days ago at 11.2.  Patient was admitted with working diagnosis of upper GI bleed.  Patient was started on IV Protonix and GI was consulted.  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  This was thought to be related to NSAID use.  Hemoglobin was monitored and remained stable upon discharge.  Patient did not have further hematemesis or melanic episode and she was deemed stable for discharge home.   Subjective: Patient seen and examined, continues to do well no further hematemesis.  Denies abdominal pain, nausea vomiting and diarrhea.  No acute events overnight.  Tolerating diet well.   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  Hemoglobin this morning dropped 1 g from yesterday.  IV iron x1 given.  No signs of overt bleeding.  Will discharge on iron supplementation.  Monitor CBC 1 week.   Upper GI bleed/duodenal stricture/gastric ulcer Felt to be secondary to NSAID use, patient also with duodenal stenosis and severe stricture confirmed by upper GI series. GI recommending patient to continue full liquid diet and protein calorie supplements until EGD can be repeated to attempt dilation of stricture.  Gastric biopsy was negative for H. pylori Continue PPI twice daily.  GI team arranging follow-up  Hypokalemia/hypomagnesemia - resolved  Right foot tibial fracture Cast in place, stable  All other chronic medical condition were stable during the hospitalization.  On the day of the discharge the patient's vitals were stable, and no other acute medical condition were reported by patient. the patient was felt safe to be discharge to home.  Discharge Instructions  You were cared for by a hospitalist during your hospital stay. If you have any questions about your discharge medications or the care you received while you were in the hospital after you are discharged, you can call the unit and asked to speak with the hospitalist on call if the hospitalist that took care of you is not available. Once you are discharged, your primary care physician will handle any further medical issues. Please note that NO REFILLS for any discharge medications will be authorized once you are discharged, as it is imperative that you return to your primary care physician (or establish a relationship with a primary care  physician if you do not have one) for your aftercare needs so that they can reassess your need for medications and monitor your lab values.  Discharge Instructions    Call MD for:  difficulty breathing, headache or visual disturbances   Complete by:  As directed    Call MD  for:  extreme fatigue   Complete by:  As directed    Call MD for:  hives   Complete by:  As directed    Call MD for:  persistant dizziness or light-headedness   Complete by:  As directed    Call MD for:  persistant nausea and vomiting   Complete by:  As directed    Call MD for:  redness, tenderness, or signs of infection (pain, swelling, redness, odor or green/yellow discharge around incision site)   Complete by:  As directed    Call MD for:  severe uncontrolled pain   Complete by:  As directed    Call MD for:  temperature >100.4   Complete by:  As directed    Diet - low sodium heart healthy   Complete by:  As directed    Full liquid diet for 2-3 weeks   Increase activity slowly   Complete by:  As directed      Allergies as of 03/04/2018      Reactions   Darvon [propoxyphene Hcl]    "climb the walls"   Vicodin [hydrocodone-acetaminophen] Other (See Comments)   "climb the walls"      Medication List    TAKE these medications   acetaminophen 325 MG tablet Commonly known as:  TYLENOL Take 2 tablets (650 mg total) by mouth every 6 (six) hours as needed for mild pain (or Fever >/= 101).   alendronate 70 MG tablet Commonly known as:  FOSAMAX Take 1 tablet (70 mg total) by mouth every 7 (seven) days. Take with a full glass of water on an empty stomach.   CALCIUM PO Take 2 tablets by mouth every morning.   iron polysaccharides 150 MG capsule Commonly known as:  NIFEREX Take 1 capsule (150 mg total) by mouth daily.   loratadine 10 MG tablet Commonly known as:  CLARITIN Take 10 mg by mouth every morning.   Magnesium 250 MG Tabs Take 500 mg by mouth every morning. [employer].   multivitamin with minerals Tabs tablet Take 2 tablets by mouth every morning.   OVER THE COUNTER MEDICATION Take 2 tablets by mouth every morning. Skeletal Strength- Natural Sunshine. What changed:  Another medication with the same name was removed. Continue taking this medication, and  follow the directions you see here.   pantoprazole sodium 40 mg/20 mL Pack Commonly known as:  PROTONIX Take 20 mLs (40 mg total) by mouth 2 (two) times daily before a meal.   polyethylene glycol packet Commonly known as:  MIRALAX Take 17 g by mouth daily.   SUMAtriptan 100 MG tablet Commonly known as:  IMITREX Take 1 as needed for migraineMay repeat in 2 hours if headache persists or recurs.   VITAMIN C PO Take 1 tablet by mouth every morning. Time Release - Emiliano Dyer.      Follow-up Information    Harrison Mons, PA-C. Schedule an appointment as soon as possible for a visit in 1 week(s).   Specialty:  Family Medicine Why:  Hospital follow-up Contact information: [address] [place] [address] [phone]        Doran Stabler, MD Follow up.  Specialty:  Gastroenterology Why:  Follow-up in 2-3 weeks, office will call you for appointment Contact information: [address] [place] [address] [phone]          Allergies  Allergen Reactions  . Darvon [Propoxyphene Hcl]     "climb the walls"  . Vicodin [Hydrocodone-Acetaminophen] Other (See Comments)    "climb the walls"    Consultations:  GI   Procedures/Studies: Ct Abdomen Pelvis W Contrast  Result Date: 03/02/2018 CLINICAL DATA:  Hematemesis.  Nausea and vomiting. EXAM: CT ABDOMEN AND PELVIS WITH CONTRAST TECHNIQUE: Multidetector CT imaging of the abdomen and pelvis was performed using the standard protocol following bolus administration of intravenous contrast. CONTRAST:  80 cc Isovue-300 IV COMPARISON:  Radiograph earlier this day. FINDINGS: Lower chest: Linear atelectasis/scarring in both lower lobes. No consolidation or pleural fluid. Hepatobiliary: Scattered tiny hepatic hypodensities are too small to characterize, may be small cysts, hamartomas or hemangiomas. Calcified gallstone within minimally distended gallbladder. No pericholecystic inflammation. No biliary  dilatation. Pancreas: Parenchymal atrophy. No ductal dilatation or inflammation. Spleen: Normal in size without focal abnormality. Adrenals/Urinary Tract: Normal adrenal glands. No hydronephrosis or perinephric edema. Homogeneous renal enhancement with symmetric excretion on delayed phase imaging. Urinary bladder is physiologically distended, detailed evaluation obscured by streak artifact from bilateral hip arthroplasties. Stomach/Bowel: Mild-to-moderate gastric wall thickening about the cardia. There is moderate near circumferential wall thickening about the gastric body. No definite perigastric inflammation. 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 No other bowel inflammation or wall thickening. No obstruction. Streak artifact from bilateral hip arthroplasties partially limits evaluation of pelvic bowel loops. Vascular/Lymphatic: Mild aortic atherosclerosis. Incidental circumaortic left renal vein. No enlarged abdominal or pelvic lymph nodes, allowing for streak artifact in the pelvis. Reproductive: Post hysterectomy. No gross adnexal mass allowing for streak artifact. Other: No ascites or free air.  No intra-abdominal abscess. Musculoskeletal: Bilateral hip arthroplasties. There are no acute or suspicious osseous abnormalities. IMPRESSION: 1. Near circumferential gastric wall thickening about the body, differential considerations include gastritis, peptic ulcer disease, or gastric mass. Recommend correlation with endoscopy. There is also wall thickening about the gastric cardia. 2. Gallstone without gallbladder inflammation. 3. Tiny hepatic hypodensities which are too small to accurately characterize. 4.  Aortic Atherosclerosis (ICD10-I70.0). Electronically Signed   By: Jeb Levering M.D.   On: 03/02/2018 02:27   Dg Abd 2 Views  Result Date: 03/01/2018 CLINICAL DATA:  Right upper quadrant and  epigastric tenderness and pain for over week, nausea and vomiting EXAM: ABDOMEN - 2 VIEW COMPARISON:  None. FINDINGS: Supine and erect views the abdomen show no bowel obstruction. No free air is seen on the erect view. Of rounded metallic opacity in the midline of the lower chest on the initial view represents the patient's necklace which was moved on the second view. No opaque calculi are seen. 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 There are degenerative changes at the L5-S1 level. IMPRESSION: 1. No bowel obstruction.  No free air. 2. Bilateral total hip replacements. Electronically Signed   By: Ivar Drape M.D.   On: 03/01/2018 15:07   Dg Duanne Limerick W/water Sol Cm  Addendum Date: 03/02/2018   ADDENDUM REPORT: 03/02/2018 17:38 ADDENDUM: Study discussed by telephone with Dr. Hilarie Fredrickson, covergin for Dr. Wilfrid Lund III) on 03/02/2018 at 1735 hours. Electronically Signed   By: Genevie Ann M.D.   On: 03/02/2018 17:38   Result Date: 03/02/2018 CLINICAL DATA:  67 year old female with high-grade stricture in the proximal duodenum encountered on endoscopy today. The stenosis was unable to be traversed by adult or pediatric endoscope. EXAM: WATER SOLUBLE UPPER GI SERIES TECHNIQUE: Single-column upper GI series was performed using water soluble contrast. CONTRAST:  154mL ISOVUE-300 IOPAMIDOL (ISOVUE-300) INJECTION 61% COMPARISON:  CT Abdomen and Pelvis 03/02/2018. Report of EGD earlier today FLUOROSCOPY TIME:  Fluoroscopy Time:  2 minutes 48 seconds Radiation Exposure Index (if provided by the fluoroscopic device): 35.3 mGy Number of Acquired Spot Images: 0 FINDINGS: Preprocedural scout view of the abdomen. Oral contrast from the CT Abdomen and Pelvis at 0158 hours today is now present throughout the large bowel. Non obstructed bowel gas pattern. Negative lung bases. A single contrast study was undertaken with water-soluble contrast and the patient tolerated this well and without difficulty. No obstruction to the forward flow  of contrast throughout the esophagus and into the stomach. Normal esophageal course and contour. Mild circumferential narrowing at the gastroesophageal junction might correspond to a Schatzki ring described on the endoscopy today (series 11). Prompt transit of contrast from proximal to the distal stomach and into the duodenum bulb which is dilated (series 7). The gastric pylorus seem to be continuously patent throughout the study. The patient was positioned RPO, and small volumes of contrast began to traverse a short segment severe stenosis. The stenosis length is estimated at 5-6 millimeters (series 26). To and fro motion of contrast in the stomach and duodenum bulb occurred throughout this time. Occasionally, a small round sacculation was opacified in conjunction with the stenosis and this is seen on series 30 cine images (image 88). Compare this appearance to sagittal image 36 of the earlier CT Abdomen and Pelvis which demonstrates the short segment stenosis (4 millimeters) and lateral to it a small linear appearing sacculation (image 34). Over the course of 7 minutes only a small volume of the oral contrast had traversed the stricture. IMPRESSION: 1. Short-segment very severe stricture in the proximal duodenum just distal to a dilated duodenum bulb. The stricture appears to be less than 5-6 mm in length, and has a small disc like sacculation associated. 2. Compared the images on this exam to sagittal images 34 through 36 on the CT Abdomen and Pelvis earlier today. Electronically Signed: By: Genevie Ann M.D. On: 03/02/2018 17:18    Discharge Exam: Vitals:   03/04/18 0501 03/04/18 0735  BP: (!) 112/55 115/67  Pulse: 83 87  Resp: 18 20  Temp: 98.4 F (36.9 C) 97.7 F (36.5 C)  SpO2: 98% 100%   Vitals:   03/03/18 1400 03/03/18 2021 03/04/18 0501 03/04/18 0735  BP: 117/60 109/63 (!) 112/55 115/67  Pulse: 94 100 83 87  Resp:  18 18 20   Temp: 98.4 F (36.9 C) 99.1 F (37.3 C) 98.4 F (36.9 C) 97.7 F  (36.5 C)  TempSrc: Oral Oral Oral Oral  SpO2: 100% 98% 98% 100%  Weight:   47.1 kg (103 lb 13.4 oz)   Height:        General: Pt is alert, awake, not in acute distress Cardiovascular: RRR, S1/S2 +, no rubs, no gallops Respiratory: CTA bilaterally, no wheezing, no rhonchi Abdominal: Soft, NT, ND, bowel sounds + Extremities: no edema, no cyanosis  The results of significant diagnostics from this hospitalization (including imaging, microbiology, ancillary and laboratory) are listed below for reference.     Microbiology: No results found for this or any previous visit (from the past 240 hour(s)).   Labs: BNP (last  3 results) No results for input(s): BNP in the last 8760 hours. Basic Metabolic Panel: Recent Labs  Lab 03/01/18 2330 03/03/18 0551 03/04/18 0532  NA 141 139 142  K 3.0* 3.3* 4.8  CL 90* 104 109  CO2 33* 28 26  GLUCOSE 113* 119* 108*  BUN 33* 18 10  CREATININE 0.80 0.60 0.63  CALCIUM 9.2 8.5* 9.0  MG  --  1.6* 1.7   Liver Function Tests: Recent Labs  Lab 03/01/18 2330 03/03/18 0551  AST 21 18  ALT 25 20  ALKPHOS 84 56  BILITOT 0.8 0.4  PROT 6.5 4.5*  ALBUMIN 3.3* 2.2*   Recent Labs  Lab 03/01/18 2330  LIPASE 33   No results for input(s): AMMONIA in the last 168 hours. CBC: Recent Labs  Lab 03/01/18 2330 03/02/18 0015 03/02/18 1000 03/03/18 0551 03/04/18 0532  WBC 5.4  --   --  5.8 4.9  NEUTROABS 2.9  --   --   --  1.5*  HGB 17.9* 9.2*  --  8.0* 8.4*  HCT 51.2* 27.4* 26.8* 24.0* 25.3*  MCV 82.8  --   --  85.1 86.3  PLT 237  --   --  234 246   Cardiac Enzymes: No results for input(s): CKTOTAL, CKMB, CKMBINDEX, TROPONINI in the last 168 hours. BNP: Invalid input(s): POCBNP CBG: Recent Labs  Lab 03/03/18 1135 03/03/18 1709 03/03/18 2046 03/03/18 2348 03/04/18 0535  GLUCAP 123* 110* 124* 102* 99   D-Dimer No results for input(s): DDIMER in the last 72 hours. Hgb A1c No results for input(s): HGBA1C in the last 72 hours. Lipid  Profile No results for input(s): CHOL, HDL, LDLCALC, TRIG, CHOLHDL, LDLDIRECT in the last 72 hours. Thyroid function studies No results for input(s): TSH, T4TOTAL, T3FREE, THYROIDAB in the last 72 hours.  Invalid input(s): FREET3 Anemia work up Recent Labs    03/02/18 0953  VITAMINB12 4,799*  FERRITIN 33  TIBC 252  IRON 20*   Urinalysis    Component Value Date/Time   COLORURINE YELLOW 05/01/2015 1348   APPEARANCEUR CLOUDY (A) 05/01/2015 1348   LABSPEC 1.018 05/01/2015 1348   PHURINE 6.0 05/01/2015 1348   GLUCOSEU NEGATIVE 05/01/2015 1348   HGBUR NEGATIVE 05/01/2015 1348   BILIRUBINUR negative 06/10/2017 1024   BILIRUBINUR neg 04/19/2013 1554   KETONESUR negative 06/10/2017 1024   KETONESUR NEGATIVE 05/01/2015 1348   PROTEINUR negative 06/10/2017 1024   PROTEINUR NEGATIVE 05/01/2015 1348   UROBILINOGEN 0.2 06/10/2017 1024   UROBILINOGEN 0.2 05/01/2015 1348   NITRITE Negative 06/10/2017 1024   NITRITE NEGATIVE 05/01/2015 1348   LEUKOCYTESUR Negative 06/10/2017 1024   Sepsis Labs Invalid input(s): PROCALCITONIN,  WBC,  LACTICIDVEN Microbiology No results found for this or any previous visit (from the past 240 hour(s)).   Time coordinating discharge: 32 minutes  SIGNED:  Chipper Oman, MD  [facility] 03/04/2018, 11:01 AM  Pager please text page via  [email or web address]  Note - This record has been created using [employer]. Chart creation errors have been sought, but may not always have been located. Such creation errors do not reflect on the standard of medical care.

## 2018-03-04 NOTE — Progress Notes (Signed)
Gastric biopsy negative for H. Pylori. Please inform patient. Hgb stable at 8.4 today. Recommendations per my note yesterday.

## 2018-03-07 ENCOUNTER — Telehealth: Payer: Self-pay | Admitting: Gastroenterology

## 2018-03-07 ENCOUNTER — Other Ambulatory Visit: Payer: Self-pay

## 2018-03-07 ENCOUNTER — Telehealth: Payer: Self-pay | Admitting: Internal Medicine

## 2018-03-07 MED ORDER — PANTOPRAZOLE SODIUM 40 MG PO PACK: 40.0000 mg | PACK | Freq: Every day | ORAL | 3 refills | Status: DC

## 2018-03-07 MED ORDER — PANTOPRAZOLE SODIUM 40 MG PO TBEC: 40.0000 mg | DELAYED_RELEASE_TABLET | Freq: Two times a day (BID) | ORAL | 3 refills | Status: DC

## 2018-03-07 NOTE — Telephone Encounter (Signed)
She needs pantoprazole 40 mg twice daily before breakfast and supper.  Disp#60, RF 2  If insurance prefers another PPI such as omeprazole, that is fine as long as also given twice daily.  Please send prescription today

## 2018-03-07 NOTE — Telephone Encounter (Signed)
Rx for Protonix has been sent to the pharmacy. Pt has been notified and aware.

## 2018-03-07 NOTE — Telephone Encounter (Signed)
Called the pharmacy and cancelled the Protonix tablets. Spoke to the pharmacist at [facility]. He will proceed with the solution Protonix. New Rx sent. Pt is aware.

## 2018-03-07 NOTE — Telephone Encounter (Signed)
Called [facility]. They are trying to contact a different doctor (Dr.Silva) for verification for a RX. Called the pt she states that the order was sent in wrong and was only given the Protonix for a 2 day supply. Please advise

## 2018-03-07 NOTE — Telephone Encounter (Signed)
Change of plans regarding the medication:   Due to her duodenal stricture, I would like the medicine to be liquid form.  Protonix solution 40 packet, one twice daily with breakfast and supper.  Disp #60, RF 2  If the pharmacy cannot get that or insurance will not cover, then I would prefer lansoprazole (prevacid) 30 mg dissolving tablet.  Sig:  One by mouth twice daily with breakfast and supper.  Disp #60 RF 2

## 2018-03-08 ENCOUNTER — Other Ambulatory Visit: Payer: Self-pay

## 2018-03-08 ENCOUNTER — Telehealth: Payer: Self-pay

## 2018-03-08 DIAGNOSIS — K315 Obstruction of duodenum: Secondary | ICD-10-CM

## 2018-03-08 NOTE — Telephone Encounter (Signed)
Spoke to patient confirmed EGD, mailed prep instructions to patient.

## 2018-03-08 NOTE — Telephone Encounter (Signed)
-----   Message from Nelida Meuse III, MD sent at 03/03/2018 11:01 AM EDT ----- Carol King,    This patient will be discharged from the hospital and needs to be scheduled for an EGD in my next [facility] block on 4/9.  Please put her in after the 10:30 case that morning.  It will be EGD with balloon dilation duodenal stricture - with fluoroscopy.    Call patient Monday to finalize.  Thanks.  - HD

## 2018-03-09 ENCOUNTER — Encounter (HOSPITAL_COMMUNITY): Payer: Self-pay

## 2018-03-09 ENCOUNTER — Emergency Department (HOSPITAL_COMMUNITY)
Admission: EM | Admit: 2018-03-09 | Discharge: 2018-03-09 | Disposition: A | Discharge status: Home / Self Care | Payer: BLUE CROSS/BLUE SHIELD | Attending: Emergency Medicine | Admitting: Emergency Medicine

## 2018-03-09 ENCOUNTER — Other Ambulatory Visit: Payer: BLUE CROSS/BLUE SHIELD

## 2018-03-09 ENCOUNTER — Other Ambulatory Visit: Payer: Self-pay

## 2018-03-09 DIAGNOSIS — Z79899 Other long term (current) drug therapy: Secondary | ICD-10-CM | POA: Diagnosis not present

## 2018-03-09 DIAGNOSIS — R112 Nausea with vomiting, unspecified: Secondary | ICD-10-CM | POA: Diagnosis not present

## 2018-03-09 DIAGNOSIS — Z87891 Personal history of nicotine dependence: Secondary | ICD-10-CM | POA: Insufficient documentation

## 2018-03-09 DIAGNOSIS — I10 Essential (primary) hypertension: Secondary | ICD-10-CM | POA: Diagnosis not present

## 2018-03-09 DIAGNOSIS — E860 Dehydration: Secondary | ICD-10-CM

## 2018-03-09 DIAGNOSIS — K920 Hematemesis: Secondary | ICD-10-CM

## 2018-03-09 LAB — COMPREHENSIVE METABOLIC PANEL
ALT: 15 U/L (ref 14–54)
AST: 14 U/L — ABNORMAL LOW (ref 15–41)
Albumin: 2.8 g/dL — ABNORMAL LOW (ref 3.5–5.0)
Alkaline Phosphatase: 78 U/L (ref 38–126)
Anion gap: 13 (ref 5–15)
BUN: 22 mg/dL — ABNORMAL HIGH (ref 6–20)
CHLORIDE: 102 mmol/L (ref 101–111)
CO2: 26 mmol/L (ref 22–32)
CREATININE: 0.58 mg/dL (ref 0.44–1.00)
Calcium: 9.5 mg/dL (ref 8.9–10.3)
Glucose, Bld: 119 mg/dL — ABNORMAL HIGH (ref 65–99)
Potassium: 3.7 mmol/L (ref 3.5–5.1)
Sodium: 141 mmol/L (ref 135–145)
Total Bilirubin: 0.5 mg/dL (ref 0.3–1.2)
Total Protein: 6.3 g/dL — ABNORMAL LOW (ref 6.5–8.1)

## 2018-03-09 LAB — CBC WITH DIFFERENTIAL/PLATELET
Basophils Absolute: 0 10*3/uL (ref 0.0–0.1)
Basophils Relative: 0 %
Eosinophils Absolute: 0.1 10*3/uL (ref 0.0–0.7)
Eosinophils Relative: 1 %
HCT: 33.5 % — ABNORMAL LOW (ref 36.0–46.0)
Hemoglobin: 10.7 g/dL — ABNORMAL LOW (ref 12.0–15.0)
Lymphocytes Relative: 37 %
Lymphs Abs: 3.3 10*3/uL (ref 0.7–4.0)
MCH: 27.5 pg (ref 26.0–34.0)
MCHC: 31.9 g/dL (ref 30.0–36.0)
MCV: 86.1 fL (ref 78.0–100.0)
Monocytes Absolute: 0.6 10*3/uL (ref 0.1–1.0)
Monocytes Relative: 7 %
Neutro Abs: 4.9 10*3/uL (ref 1.7–7.7)
Neutrophils Relative %: 55 %
Platelets: 500 10*3/uL — ABNORMAL HIGH (ref 150–400)
RBC: 3.89 MIL/uL (ref 3.87–5.11)
RDW: 13.3 % (ref 11.5–15.5)
WBC: 8.8 10*3/uL (ref 4.0–10.5)

## 2018-03-09 LAB — URINALYSIS, ROUTINE W REFLEX MICROSCOPIC
BILIRUBIN URINE: NEGATIVE
Glucose, UA: NEGATIVE mg/dL
Hgb urine dipstick: NEGATIVE
Ketones, ur: 80 mg/dL — AB
Leukocytes, UA: NEGATIVE
NITRITE: NEGATIVE
Protein, ur: NEGATIVE mg/dL
SPECIFIC GRAVITY, URINE: 1.02 (ref 1.005–1.030)
pH: 6 (ref 5.0–8.0)

## 2018-03-09 LAB — LIPASE, BLOOD: LIPASE: 28 U/L (ref 11–51)

## 2018-03-09 MED ORDER — PANTOPRAZOLE SODIUM 40 MG IV SOLR
40.0000 mg | Freq: Once | INTRAVENOUS | Status: AC
  Administered 2018-03-09: 40 mg via INTRAVENOUS
  Filled 2018-03-09: qty 40

## 2018-03-09 MED ORDER — ONDANSETRON HCL 4 MG/2ML IJ SOLN
4.0000 mg | Freq: Once | INTRAMUSCULAR | Status: AC
  Administered 2018-03-09: 4 mg via INTRAVENOUS
  Filled 2018-03-09: qty 2

## 2018-03-09 MED ORDER — PANTOPRAZOLE SODIUM 40 MG PO PACK
40.0000 mg | PACK | Freq: Every day | ORAL | Status: DC
  Administered 2018-03-09: 40 mg
  Filled 2018-03-09: qty 20

## 2018-03-09 MED ORDER — ONDANSETRON 8 MG PO TBDP: 8.0000 mg | ORAL_TABLET | Freq: Three times a day (TID) | ORAL | 0 refills | Status: DC | PRN

## 2018-03-09 MED ORDER — SODIUM CHLORIDE 0.9 % IV BOLUS
1000.0000 mL | Freq: Once | INTRAVENOUS | Status: AC
  Administered 2018-03-09: 1000 mL via INTRAVENOUS

## 2018-03-09 NOTE — ED Notes (Signed)
1st URINE REQUEST MADE, SHE IS UNABLE TO PROVIDE ONE AT THIS TIME

## 2018-03-09 NOTE — ED Triage Notes (Signed)
Patient was recently hospitalized for vomiting blood. Patient states she had a precsription for Protonix, but the pharmacy didn't have any Liquid Protonix and patient has not had Protonix x 5 days. Patient states when she vomits it is red/brown.

## 2018-03-09 NOTE — ED Notes (Signed)
Pt able to keep fluids down.  

## 2018-03-09 NOTE — Discharge Instructions (Signed)
Take Zofran as needed for nausea.  Wait around 20-30 minutes before having something to eat or drink after taking this medication to give it time to work.  Make sure to get your liquid Protonix from your pharmacy today.  If you are unable to obtain it from your pharmacy call your gastroenterology office for aid in obtaining this medication.  Continue with liquid diet until follow-up with GI for your upcoming procedure.  Return to the emergency department if any concerning signs or symptoms develop such as worsening pain, inability to tolerate any fluids orally, or fevers.

## 2018-03-09 NOTE — ED Provider Notes (Signed)
[facility] DEPT Provider Note   CSN: [id number] Arrival date & time: 03/09/18  0709     History   Chief Complaint Chief Complaint  Patient presents with  . Hematemesis    HPI Carol King is a 67 y.o. female with history of arthritis, hypertension, hyperthyroidism, fibromyalgia presents today for chief complaint acute onset, per aggressively worsening nausea and vomiting as well as hematemesis.  She was recently hospitalized for upper GI bleed and discharged on 03/04/18 with prescription for liquid Protonix.  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  She has follow-up scheduled with GI for repeat EGD with balloon procedure. she states she was unable to get this prescription filled at the pharmacy did not have this medication in stock.  She notes that she was able to keep food down on Monday after discharge on Saturday but began developing nausea and vomiting on Tuesday.  She notes 2-4 episodes of nonbloody nonbilious emesis daily specifically after meals and states she has not really been able to keep any food down.  This morning at around 4 AM she had an episode of emesis which she states was dark brown in which tasted metallic leading her to believe that it was bloody.  She has not been taking Zofran.  Her hyperthyroidism is untreated.  She denies shortness of breath, chest pain, lightheadedness, abdominal pain, urinary symptoms, melena, or hematochezia.  The history is provided by the patient.    Past Medical History:  Diagnosis Date  . Arthritis   . Complication of anesthesia   . Difficulty sleeping    DUE TO HIP PAIN  . Headache    MIGRAINES  . History of skin cancer   . Hypertension   . PONV (postoperative nausea and vomiting)    SEVERAL YRS AGO - NONE WITH MORE RECENT SURGERIES  . Skin cancer    BCC, SCC    Patient Active Problem List   Diagnosis Date Noted  . GI bleed 03/02/2018  .  Osteoporosis 10/14/2017  . History of basal cell carcinoma (BCC) 06/10/2017  . 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 Fibromyalgia 01/10/2014  . Post-menopausal 01/10/2014  . Migraine 04/19/2013  . Hyperthyroidism 04/19/2013    Past Surgical History:  Procedure Laterality Date  . ABDOMINAL HYSTERECTOMY  1981   one ovary remains  . APPENDECTOMY    . ESOPHAGOGASTRODUODENOSCOPY (EGD) WITH PROPOFOL N/A 03/02/2018   Procedure: ESOPHAGOGASTRODUODENOSCOPY (EGD) WITH PROPOFOL;  Surgeon: Doran Stabler, MD;  Location: [facility];  Service: Gastroenterology;  Laterality: N/A;  . SKIN CANCER EXCISION    . TONSILLECTOMY AND ADENOIDECTOMY    . TOTAL HIP ARTHROPLASTY Right 07/11/2011   HIP; [facility]  . TOTAL HIP ARTHROPLASTY Left 05/06/2015   Procedure: LEFT TOTAL HIP ARTHROPLASTY ANTERIOR APPROACH;  Surgeon: Paralee Cancel, MD;  Location: [facility];  Service: Orthopedics;  Laterality: Left;     OB History   None      Home Medications    Prior to Admission medications   Medication Sig Start Date End Date Taking? Authorizing Provider  acetaminophen (TYLENOL) 325 MG tablet Take 2 tablets (650 mg total) by mouth every 6 (six) hours as needed for mild pain (or Fever >/= 101). 05/06/15  Yes Babish, Rodman Key, PA-C  Ascorbic Acid (VITAMIN C PO) Take 1 tablet by mouth every morning. Time Release - Emiliano Dyer.   Yes [provider]  CALCIUM PO Take 2 tablets by mouth every morning.  Yes [provider]  iron polysaccharides (NIFEREX) 150 MG capsule Take 1 capsule (150 mg total) by mouth daily. 03/04/18  Yes Patrecia Pour, Christean Grief, MD  loratadine (CLARITIN) 10 MG tablet Take 10 mg by mouth every morning.   Yes [provider]  Magnesium 250 MG TABS Take 500 mg by mouth every morning. [employer].   Yes [provider]  Multiple Vitamin (MULTIVITAMIN WITH MINERALS) TABS tablet Take 2 tablets by mouth every morning.   Yes [provider]  OVER THE COUNTER MEDICATION  Take 2 tablets by mouth every morning. Skeletal Strength- Natural Sunshine.   Yes [provider]  SUMAtriptan (IMITREX) 100 MG tablet Take 1 as needed for migraineMay repeat in 2 hours if headache persists or recurs. 06/10/17  Yes Jeffery, Chelle, PA-C  alendronate (FOSAMAX) 70 MG tablet Take 1 tablet (70 mg total) by mouth every 7 (seven) days. Take with a full glass of water on an empty stomach. 10/14/17   Harrison Mons, PA-C  ondansetron (ZOFRAN ODT) 8 MG disintegrating tablet Take 1 tablet (8 mg total) by mouth every 8 (eight) hours as needed for nausea or vomiting. 03/09/18   Jola Schmidt, MD  pantoprazole sodium (PROTONIX) 40 mg/20 mL PACK Place 20 mLs (40 mg total) into feeding tube daily. 03/07/18   Doran Stabler, MD  polyethylene glycol [facility]) packet Take 17 g by mouth daily. Patient not taking: Reported on 03/09/2018 03/04/18   Doreatha Lew, MD    Family History Family History  Problem Relation Age of Onset  . Cancer Mother 9       breast cancer  . Cancer Maternal Grandmother        breast cancer  . Kidney disease Maternal Grandfather   . Arthritis Paternal Grandmother     Social History Social History   Tobacco Use  . Smoking status: Former Smoker    Last attempt to quit: 04/30/1994    Years since quitting: 23.8  . Smokeless tobacco: Former Systems developer    Quit date: 03/09/1993  Substance Use Topics  . Alcohol use: No    Alcohol/week: 0.0 oz  . Drug use: No     Allergies   Darvon [propoxyphene hcl] and Vicodin [hydrocodone-acetaminophen]   Review of Systems Review of Systems  Constitutional: Negative for chills and fever.  Respiratory: Negative for shortness of breath.   Cardiovascular: Negative for chest pain.  Gastrointestinal: Positive for nausea and vomiting. Negative for abdominal pain, blood in stool, constipation and diarrhea.       +hematemesis  Genitourinary: Negative for decreased urine volume, dysuria and hematuria.  Neurological:  Negative for syncope and light-headedness.  All other systems reviewed and are negative.    Physical Exam Updated Vital Signs BP (!) 141/84   Pulse 100   Temp 97.9 F (36.6 C) (Oral)   Resp (!) 23   Ht 5\' 3"  (1.6 m)   Wt 46.7 kg (103 lb)   SpO2 95%   BMI 18.25 kg/m   Physical Exam  Constitutional: She appears well-developed and well-nourished. No distress.  Very thin but overall well in appearance  HENT:  Head: Normocephalic and atraumatic.  Eyes: Conjunctivae are normal. Right eye exhibits no discharge. Left eye exhibits no discharge.  Neck: No JVD present. No tracheal deviation present.  Cardiovascular: Regular rhythm.  Mildly tachycardic  Pulmonary/Chest: Effort normal and breath sounds normal. No stridor. No respiratory distress. She has no wheezes. She has no rales. She exhibits no tenderness.  Abdominal:  Soft. Bowel sounds are normal. She exhibits no distension. There is tenderness. There is no guarding.  Very mild tenderness to palpation of the epigastric region.  Abdomen is scaphoid.  No CVA tenderness.  Murphy sign absent, Rovsing's absent  Musculoskeletal: She exhibits no edema.  Neurological: She is alert.  Skin: Skin is warm and dry. No erythema.  Psychiatric: She has a normal mood and affect. Her behavior is normal.  Nursing note and vitals reviewed.    ED Treatments / Results  Labs (all labs ordered are listed, but only abnormal results are displayed) Labs Reviewed  COMPREHENSIVE METABOLIC PANEL - Abnormal; Notable for the following components:      Result Value   Glucose, Bld 119 (*)    BUN 22 (*)    Total Protein 6.3 (*)    Albumin 2.8 (*)    AST 14 (*)    All other components within normal limits  URINALYSIS, ROUTINE W REFLEX MICROSCOPIC - Abnormal; Notable for the following components:   APPearance HAZY (*)    Ketones, ur 80 (*)    All other components within normal limits  CBC WITH DIFFERENTIAL/PLATELET - Abnormal; Notable for the following  components:   Hemoglobin 10.7 (*)    HCT 33.5 (*)    Platelets 500 (*)    All other components within normal limits  LIPASE, BLOOD    EKG None  Radiology No results found.  Procedures Procedures (including critical care time)  Medications Ordered in ED Medications  pantoprazole sodium (PROTONIX) 40 mg/20 mL oral suspension 40 mg (40 mg Per Tube Given 03/09/18 1243)  pantoprazole (PROTONIX) injection 40 mg (40 mg Intravenous Given 03/09/18 0818)  ondansetron (ZOFRAN) injection 4 mg (4 mg Intravenous Given 03/09/18 0941)  sodium chloride 0.9 % bolus 1,000 mL (0 mLs Intravenous Stopped 03/09/18 1030)  sodium chloride 0.9 % bolus 1,000 mL (0 mLs Intravenous Stopped 03/09/18 1244)     Initial Impression / Assessment and Plan / ED Course  I have reviewed the triage vital signs and the nursing notes.  Pertinent labs & imaging results that were available during my care of the patient were reviewed by me and considered in my medical decision making (see chart for details).     Patient presents with nausea and vomiting after meals as well as one episode of hematemesis this morning.  She was recently discharged from the hospital with known duodenal stricture and instructions for liquid only diet with liquid Protonix.  She has been unable to fill this medication as it was not available at her pharmacy.  She is afebrile, initially tachycardic with complete resolution while in the ED.  She states she thinks it was because she was active and moving around a lot when initial vitals were taken.  She is nontoxic in appearance.  Abdomen is soft and with very mild tenderness to palpation of the epigastric region.  Lab work reviewed by me shows no leukocytosis and improved anemia although she may be hemoconcentrated due to her persistent vomiting.  CMP does show an elevation in her BUN as compared to 5 days ago on discharge although it is very mildly elevated from normal values.  Lipase is within normal  limits as is creatinine.  UA is not concerning for UTI or nephrolithiasis.  I do not see need for advanced imaging at this time.  Patient received multiple fluid boluses, Zofran, IV and p.o. Protonix with significant improvement in her symptoms.  On reevaluation she is resting comfortably, repeat  abdominal examinations unremarkable.  She is tolerating p.o. fluids without difficulty.  She tells me that her pharmacy has informed her that her prescription will be ready today and she states that if she is still unable to pick it up from her pharmacy she will call her gastroenterologist for further assistance.  She does have a procedure scheduled with gastroenterology in early April.  Doubt acute surgical abdominal pathology.  Will discharge with a small course of Zofran additionally.  Advised to advance diet very slowly.  Discussed indications for return to the ED. Pt verbalized understanding of and agreement with plan and is safe for discharge home at this time.  Patient seen and evaluated by Dr. Venora Maples who agrees with assessment and plan at this time.  Final Clinical Impressions(s) / ED Diagnoses   Final diagnoses:  Nausea and vomiting, intractability of vomiting not specified, unspecified vomiting type  Dehydration  Hematemesis, presence of nausea not specified    ED Discharge Orders        Ordered    ondansetron (ZOFRAN ODT) 8 MG disintegrating tablet  Every 8 hours PRN     03/09/18 [place], Mckynzi Cammon A, PA-C 03/09/18 1644    Jola Schmidt, MD 03/17/18 1218

## 2018-03-09 NOTE — ED Notes (Signed)
Pt wheeled to restroom.

## 2018-03-09 NOTE — ED Notes (Signed)
Pt provided ice water 

## 2018-03-10 ENCOUNTER — Other Ambulatory Visit: Payer: Self-pay

## 2018-03-10 ENCOUNTER — Encounter (HOSPITAL_COMMUNITY): Payer: Self-pay | Admitting: *Deleted

## 2018-03-13 DIAGNOSIS — S82201D Unspecified fracture of shaft of right tibia, subsequent encounter for closed fracture with routine healing: Secondary | ICD-10-CM | POA: Diagnosis not present

## 2018-03-17 ENCOUNTER — Other Ambulatory Visit: Payer: Self-pay

## 2018-03-20 ENCOUNTER — Encounter: Payer: Self-pay | Admitting: Physician Assistant

## 2018-03-21 ENCOUNTER — Encounter (HOSPITAL_COMMUNITY)
Admission: RE | Disposition: A | Discharge status: Home / Self Care | Payer: Self-pay | Source: Ambulatory Visit | Attending: Gastroenterology

## 2018-03-21 ENCOUNTER — Other Ambulatory Visit: Payer: Self-pay

## 2018-03-21 ENCOUNTER — Telehealth: Payer: Self-pay | Admitting: Gastroenterology

## 2018-03-21 ENCOUNTER — Encounter (HOSPITAL_COMMUNITY): Payer: Self-pay

## 2018-03-21 ENCOUNTER — Ambulatory Visit (HOSPITAL_COMMUNITY)
Admission: RE | Admit: 2018-03-21 | Discharge: 2018-03-21 | Disposition: A | Discharge status: Home / Self Care | Payer: BLUE CROSS/BLUE SHIELD | Source: Ambulatory Visit | Attending: Gastroenterology | Admitting: Gastroenterology

## 2018-03-21 ENCOUNTER — Ambulatory Visit (HOSPITAL_COMMUNITY): Payer: BLUE CROSS/BLUE SHIELD | Admitting: Anesthesiology

## 2018-03-21 ENCOUNTER — Ambulatory Visit (HOSPITAL_COMMUNITY): Payer: BLUE CROSS/BLUE SHIELD

## 2018-03-21 DIAGNOSIS — I700 Atherosclerosis of aorta: Secondary | ICD-10-CM | POA: Insufficient documentation

## 2018-03-21 DIAGNOSIS — Z90710 Acquired absence of both cervix and uterus: Secondary | ICD-10-CM | POA: Diagnosis not present

## 2018-03-21 DIAGNOSIS — E0590 Thyrotoxicosis, unspecified without thyrotoxic crisis or storm: Secondary | ICD-10-CM | POA: Diagnosis not present

## 2018-03-21 DIAGNOSIS — M810 Age-related osteoporosis without current pathological fracture: Secondary | ICD-10-CM | POA: Diagnosis not present

## 2018-03-21 DIAGNOSIS — Z96642 Presence of left artificial hip joint: Secondary | ICD-10-CM | POA: Diagnosis not present

## 2018-03-21 DIAGNOSIS — G43909 Migraine, unspecified, not intractable, without status migrainosus: Secondary | ICD-10-CM | POA: Diagnosis not present

## 2018-03-21 DIAGNOSIS — Z79899 Other long term (current) drug therapy: Secondary | ICD-10-CM | POA: Diagnosis not present

## 2018-03-21 DIAGNOSIS — Z85828 Personal history of other malignant neoplasm of skin: Secondary | ICD-10-CM | POA: Diagnosis not present

## 2018-03-21 DIAGNOSIS — K254 Chronic or unspecified gastric ulcer with hemorrhage: Secondary | ICD-10-CM | POA: Diagnosis not present

## 2018-03-21 DIAGNOSIS — M1990 Unspecified osteoarthritis, unspecified site: Secondary | ICD-10-CM | POA: Diagnosis not present

## 2018-03-21 DIAGNOSIS — K315 Obstruction of duodenum: Secondary | ICD-10-CM | POA: Diagnosis not present

## 2018-03-21 DIAGNOSIS — D649 Anemia, unspecified: Secondary | ICD-10-CM | POA: Insufficient documentation

## 2018-03-21 DIAGNOSIS — K259 Gastric ulcer, unspecified as acute or chronic, without hemorrhage or perforation: Secondary | ICD-10-CM

## 2018-03-21 DIAGNOSIS — Z87891 Personal history of nicotine dependence: Secondary | ICD-10-CM | POA: Insufficient documentation

## 2018-03-21 DIAGNOSIS — I10 Essential (primary) hypertension: Secondary | ICD-10-CM | POA: Insufficient documentation

## 2018-03-21 DIAGNOSIS — K219 Gastro-esophageal reflux disease without esophagitis: Secondary | ICD-10-CM | POA: Diagnosis not present

## 2018-03-21 DIAGNOSIS — R634 Abnormal weight loss: Secondary | ICD-10-CM | POA: Diagnosis not present

## 2018-03-21 HISTORY — PX: ESOPHAGOGASTRODUODENOSCOPY (EGD) WITH PROPOFOL: SHX5813

## 2018-03-21 SURGERY — ESOPHAGOGASTRODUODENOSCOPY (EGD) WITH PROPOFOL
Anesthesia: Monitor Anesthesia Care

## 2018-03-21 MED ORDER — PROPOFOL 500 MG/50ML IV EMUL
INTRAVENOUS | Status: DC | PRN
  Administered 2018-03-21: 200 ug/kg/min via INTRAVENOUS

## 2018-03-21 MED ORDER — PROPOFOL 10 MG/ML IV BOLUS
INTRAVENOUS | Status: DC | PRN
  Administered 2018-03-21 (×2): 20 mg via INTRAVENOUS

## 2018-03-21 MED ORDER — LACTATED RINGERS IV SOLN
INTRAVENOUS | Status: DC
  Administered 2018-03-21: 1000 mL via INTRAVENOUS
  Administered 2018-03-21: 12:00:00 via INTRAVENOUS

## 2018-03-21 MED ORDER — SODIUM CHLORIDE 0.9 % IV SOLN: INTRAVENOUS | Status: DC

## 2018-03-21 MED ORDER — ONDANSETRON HCL 4 MG/2ML IJ SOLN
INTRAMUSCULAR | Status: DC | PRN
  Administered 2018-03-21: 4 mg via INTRAVENOUS

## 2018-03-21 MED ORDER — PROPOFOL 10 MG/ML IV BOLUS
INTRAVENOUS | Status: AC
  Filled 2018-03-21: qty 40

## 2018-03-21 MED ORDER — LIDOCAINE 2% (20 MG/ML) 5 ML SYRINGE
INTRAMUSCULAR | Status: DC | PRN
  Administered 2018-03-21: 100 mg via INTRAVENOUS

## 2018-03-21 SURGICAL SUPPLY — 14 items

## 2018-03-21 NOTE — Anesthesia Preprocedure Evaluation (Addendum)
Anesthesia Evaluation  Patient identified by MRN, date of birth, ID band Patient awake    Reviewed: Allergy & Precautions, NPO status , Patient's Chart, lab work & pertinent test results  History of Anesthesia Complications (+) PONV and history of anesthetic complications  Airway Mallampati: I       Dental no notable dental hx. (+) Teeth Intact   Pulmonary former smoker,    Pulmonary exam normal breath sounds clear to auscultation       Cardiovascular hypertension, Normal cardiovascular exam Rhythm:Regular Rate:Normal     Neuro/Psych negative psych ROS   GI/Hepatic   Endo/Other    Renal/GU      Musculoskeletal   Abdominal Normal abdominal exam  (+)   Peds  Hematology  (+) Blood dyscrasia, anemia ,   Anesthesia Other Findings   Reproductive/Obstetrics                             Anesthesia Physical Anesthesia Plan  ASA: II  Anesthesia Plan: MAC   Post-op Pain Management:    Induction: Intravenous  PONV Risk Score and Plan: 3 and Propofol infusion  Airway Management Planned: Natural Airway and Simple Face Mask  Additional Equipment:   Intra-op Plan:   Post-operative Plan:   Informed Consent: I have reviewed the patients History and Physical, chart, labs and discussed the procedure including the risks, benefits and alternatives for the proposed anesthesia with the patient or authorized representative who has indicated his/her understanding and acceptance.     Plan Discussed with: CRNA  Anesthesia Plan Comments:        Anesthesia Quick Evaluation

## 2018-03-21 NOTE — Interval H&P Note (Signed)
History and Physical Interval Note:  03/21/2018 11:31 AM  Georgian Co  has presented today for surgery, with the diagnosis of duodenal stricture  The various methods of treatment have been discussed with the patient and family. After consideration of risks, benefits and other options for treatment, the patient has consented to  Procedure(s): ESOPHAGOGASTRODUODENOSCOPY (EGD) WITH PROPOFOL (N/A) BALLOON DILATION with FLUOROSCOPY (N/A) as a surgical intervention .  The patient's history has been reviewed, patient examined, no change in status, stable for surgery.  I have reviewed the patient's chart and labs.  Questions were answered to the patient's satisfaction.     Carol King

## 2018-03-21 NOTE — Discharge Instructions (Signed)
YOU HAD AN ENDOSCOPIC PROCEDURE TODAY: Refer to the procedure report and other information in the discharge instructions given to you for any specific questions about what was found during the examination. If this information does not answer your questions, please call [facility] office at [phone] to clarify.  ° °YOU SHOULD EXPECT: Some feelings of bloating in the abdomen. Passage of more gas than usual. Walking can help get rid of the air that was put into your GI tract during the procedure and reduce the bloating. If you had a lower endoscopy (such as a colonoscopy or flexible sigmoidoscopy) you may notice spotting of blood in your stool or on the toilet paper. Some abdominal soreness may be present for a day or two, also. ° °DIET: Your first meal following the procedure should be a light meal and then it is ok to progress to your normal diet. A half-sandwich or bowl of soup is an example of a good first meal. Heavy or fried foods are harder to digest and may make you feel nauseous or bloated. Drink plenty of fluids but you should avoid alcoholic beverages for 24 hours. If you had a esophageal dilation, please see attached instructions for diet.   ° °ACTIVITY: Your care partner should take you home directly after the procedure. You should plan to take it easy, moving slowly for the rest of the day. You can resume normal activity the day after the procedure however YOU SHOULD NOT DRIVE, use power tools, machinery or perform tasks that involve climbing or major physical exertion for 24 hours (because of the sedation medicines used during the test).  ° °SYMPTOMS TO REPORT IMMEDIATELY: °A gastroenterologist can be reached at any hour. Please call [phone]  for any of the following symptoms:  °Following lower endoscopy (colonoscopy, flexible sigmoidoscopy) °Excessive amounts of blood in the stool  °Significant tenderness, worsening of abdominal pains  °Swelling of the abdomen that is new, acute  °Fever of 100° or  higher  °Following upper endoscopy (EGD, EUS, ERCP, esophageal dilation) °Vomiting of blood or coffee ground material  °New, significant abdominal pain  °New, significant chest pain or pain under the shoulder blades  °Painful or persistently difficult swallowing  °New shortness of breath  °Black, tarry-looking or red, bloody stools ° °FOLLOW UP:  °If any biopsies were taken you will be contacted by phone or by letter within the next 1-3 weeks. Call [phone]  if you have not heard about the biopsies in 3 weeks.  °Please also call with any specific questions about appointments or follow up tests. ° °

## 2018-03-21 NOTE — Telephone Encounter (Signed)
Please send a referral to [facility] [place] [facility] ASAP for duodenal stricture. Prefer Drs. Gunnar Bulla, [place]  When we know who she will see, please let me know so I can send them a message with clinical details.  Send my recent hospital consult note, two EGD reports and UGI series report.

## 2018-03-21 NOTE — Anesthesia Postprocedure Evaluation (Signed)
Anesthesia Post Note  Patient: Carol King  Procedure(s) Performed: ESOPHAGOGASTRODUODENOSCOPY (EGD) WITH PROPOFOL (N/A )     Patient location during evaluation: Endoscopy Anesthesia Type: MAC Level of consciousness: awake Pain management: pain level controlled Vital Signs Assessment: post-procedure vital signs reviewed and stable Respiratory status: spontaneous breathing Cardiovascular status: stable Postop Assessment: no apparent nausea or vomiting Anesthetic complications: no    Last Vitals:  Vitals:   03/21/18 1042 03/21/18 1242  BP: (!) 147/79 140/66  Pulse: 83 85  Resp: 13 13  Temp: 36.6 C (!) 36.4 C  SpO2: 100% 100%    Last Pain:  Vitals:   03/21/18 1242  TempSrc: Oral  PainSc: 0-No pain   Pain Goal:                 Azam Gervasi JR,JOHN Naylene Foell

## 2018-03-21 NOTE — Transfer of Care (Signed)
Immediate Anesthesia Transfer of Care Note  Patient: Carol King  Procedure(s) Performed: ESOPHAGOGASTRODUODENOSCOPY (EGD) WITH PROPOFOL (N/A )  Patient Location: PACU  Anesthesia Type:MAC  Level of Consciousness: awake, alert  and oriented  Airway & Oxygen Therapy: Patient Spontanous Breathing and Patient connected to nasal cannula oxygen  Post-op Assessment: Report given to RN and Post -op Vital signs reviewed and stable  Post vital signs: Reviewed and stable  Last Vitals:  Vitals Value Taken Time  BP    Temp    Pulse 85 03/21/2018 12:41 PM  Resp 13 03/21/2018 12:41 PM  SpO2 100 % 03/21/2018 12:41 PM  Vitals shown include unvalidated device data.  Last Pain:  Vitals:   03/21/18 1042  TempSrc: Oral  PainSc: 0-No pain         Complications: No apparent anesthesia complications

## 2018-03-21 NOTE — Op Note (Signed)
[facility] Patient Name: Carol King Procedure Date: 03/21/2018 MRN: [id number] Attending MD: Estill Cotta. Loletha Carrow , MD Date of Birth: 11-21-1951 CSN: [id number] Age: 67 Admit Type: Outpatient Procedure:                Upper GI endoscopy Indications:              For therapy of duodenal stenosis found < 3 weeks                            ago, Follow-up of chronic gastric ulcer with                            hemorrhage, Nausea with vomiting, Weight loss Providers:                Mallie Mussel L. Loletha Carrow, MD, Cleda Daub, RN, Cherylynn Ridges, Technician, Virgia Land, CRNA Referring MD:              Medicines:                Monitored Anesthesia Care Complications:            No immediate complications. Estimated Blood Loss:     Estimated blood loss: none. Procedure:                Pre-Anesthesia Assessment:                           - Prior to the procedure, a History and Physical                            was performed, and patient medications and                            allergies were reviewed. The patient's tolerance of                            previous anesthesia was also reviewed. The risks                            and benefits of the procedure and the sedation                            options and risks were discussed with the patient.                            All questions were answered, and informed consent                            was obtained. Prior Anticoagulants: The patient has                            taken no previous anticoagulant or antiplatelet  agents. ASA Grade Assessment: II - A patient with                            mild systemic disease. After reviewing the risks                            and benefits, the patient was deemed in                            satisfactory condition to undergo the procedure.                           After obtaining informed consent, the endoscope was                   passed under direct vision. Throughout the                            procedure, the patient's blood pressure, pulse, and                            oxygen saturations were monitored continuously. The                            was introduced through the mouth, and advanced to                            the duodenal bulb. The upper GI endoscopy was                            performed with difficulty due to stenosis. The                            patient tolerated the procedure well. Scope In: Scope Out: Findings:      The esophagus was normal.      The cardia and gastric fundus were normal on retroflexion.      One non-bleeding superficial gastric ulcer with no stigmata of bleeding       was found at the pylorus. It was much improved from last EGD. There was       evidence of the previously-seen gastric antral ulcer site, almost       completely healed.      An acquired benign-appearing, intrinsic severe stenosis (2-80m in       diameter) was found in the duodenal bulb and was non-traversed. It       appeared very similar to recent exam. A wire-guided balloon device was       passed through the scope in to the duodenal bulb. However, the wire was       only barely engaged into the stricture. Due to the degree of stricture       and it's eccentric position (and apparent adjacent diverticulum seen on       recent UGIS), it was determined that the wire could not be safely passed       through the stricture, even with fluoroscopy. Thus, no balloon dilation       could be performed.  Impression:               - Normal esophagus.                           - Non-bleeding gastric ulcer with no stigmata of                            bleeding.                           - Acquired duodenal stenosis.                           - No specimens collected. Moderate Sedation:      MAC sedation used Recommendation:           - Patient has a contact number available for                             emergencies. The signs and symptoms of potential                            delayed complications were discussed with the                            patient. Return to normal activities tomorrow.                            Written discharge instructions were provided to the                            patient.                           - Resume previous diet.                           - Continue present medications.                           - Refer to a surgeon at appointment to be scheduled                            to consider gastrojejunostomy (without resection)                            for gastric drainage. Procedure Code(s):        --- Professional ---                           [phone], Esophagogastroduodenoscopy, flexible,                            transoral; diagnostic, including collection of                            specimen(s) by brushing or washing, when performed                            (  separate procedure) Diagnosis Code(s):        --- Professional ---                           K25.9, Gastric ulcer, unspecified as acute or                            chronic, without hemorrhage or perforation                           K31.5, Obstruction of duodenum                           K25.4, Chronic or unspecified gastric ulcer with                            hemorrhage                           R11.2, Nausea with vomiting, unspecified                           R63.4, Abnormal weight loss CPT copyright 2017 American Medical Association. All rights reserved. The codes documented in this report are preliminary and upon coder review may  be revised to meet current compliance requirements. Demere Dotzler L. Loletha Carrow, MD 03/21/2018 12:43:04 PM This report has been signed electronically. Number of Addenda: 0

## 2018-03-22 ENCOUNTER — Encounter (HOSPITAL_COMMUNITY): Payer: Self-pay | Admitting: Gastroenterology

## 2018-03-22 ENCOUNTER — Telehealth: Payer: Self-pay | Admitting: Gastroenterology

## 2018-03-22 MED ORDER — PANTOPRAZOLE SODIUM 40 MG PO PACK: 40.0000 mg | PACK | Freq: Every day | ORAL | 1 refills | Status: DC

## 2018-03-22 NOTE — Telephone Encounter (Signed)
Pt scheduled to see Dr. Georgette Dover 03/30/18@2 :20pm, pt to arrive there at 2pm. Pt aware of appt.

## 2018-03-22 NOTE — Telephone Encounter (Signed)
Called [facility] and left message for new pt coordinator and Referral faxed to [facility].

## 2018-03-22 NOTE — Telephone Encounter (Signed)
done

## 2018-03-27 ENCOUNTER — Ambulatory Visit: Payer: BLUE CROSS/BLUE SHIELD | Admitting: Internal Medicine

## 2018-03-27 ENCOUNTER — Telehealth: Payer: Self-pay | Admitting: Gastroenterology

## 2018-03-27 ENCOUNTER — Encounter: Payer: Self-pay | Admitting: Internal Medicine

## 2018-03-27 VITALS — BP 132/80 | HR 91 | Ht 63.0 in | Wt 100.4 lb

## 2018-03-27 DIAGNOSIS — E0590 Thyrotoxicosis, unspecified without thyrotoxic crisis or storm: Secondary | ICD-10-CM

## 2018-03-27 LAB — T3, FREE: T3 FREE: 4.9 pg/mL — AB (ref 2.3–4.2)

## 2018-03-27 LAB — TSH: TSH: <0.01 u[IU]/mL — ABNORMAL LOW (ref 0.35–4.50)

## 2018-03-27 LAB — T4, FREE: FREE T4: 1.94 ng/dL — AB (ref 0.60–1.60)

## 2018-03-27 NOTE — Telephone Encounter (Signed)
Faxed referral information over to [facility] GI attn: Dr. Lysle Rubens. Fax# [phone].

## 2018-03-27 NOTE — Telephone Encounter (Signed)
I have spoken with a GI physician at [facility] (Dr. 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  I spoke to Garretson this morning, and she is agreeable, and I sent her name, DOB and mobile number in a secure email to Dr. Lysle Rubens.   Carol King is putting some weight back on even though only on liquid protein supplement diet, and she is happy about the prospect of avoiding surgery. Please find the office number for the [facility] group and their fax number so we can send a referral, my recent hospital consult note, my two EGD reports, and the recent UGI series report.  I expect [facility] will contact Carol King directly for the procedure.  I also asked her to keep the surgical consult appointment this week in case the stent is not feasible and surgery then required.

## 2018-03-27 NOTE — Progress Notes (Signed)
Patient ID: Carol King, female   DOB: 1951/03/28, 67 y.o.   MRN: [id number]    HPI  Carol King is a 67 y.o.-year-old female, referred by her PCP,Jeffery, Chelle, PA-C, for evaluation for thyrotoxicosis.  She was previously seen by Dr. Dwyane Dee once, in 09/2014.  She cannot remember when she was first told her thyroid tests are abnormal.  Per my records, the first documented abnormal TSH was in 2014.  Since then, she saw Dr. Dwyane Dee in 2015 and he checked a thyroid uptake and scan and a thyroid ultrasound.  She did not return to see him afterwards.  TFTs in 2017 in 2018 were worse.  I reviewed pt's thyroid tests: Lab Results  Component Value Date   TSH <0.006 (L) 06/10/2017   TSH 0.03 (L) 06/08/2016   TSH 0.011 (L) 07/15/2014   TSH 0.288 (L) 04/19/2013   FREET4 3.51 (H) 06/10/2017   FREET4 1.9 (H) 06/08/2016   FREET4 1.17 09/18/2014   T3FREE 4.0 06/08/2016   T3FREE 2.6 09/18/2014   Antithyroid antibodies: No results found for: TSI   Thyroid uptake and scan (10/08/2014): Uptake 9.9%, consistent with subacute thyroiditis.  Possible cold nodule in the lower right lobe.  Thyroid ultrasound (10/21/2014): 1.8 cm right thyroid nodule. Corresponding to the cold nodule on the thyroid scan.  At that time, she was advised to have FNA of this nodule, but she refused.  Pt denies feeling nodules in neck, hoarseness, dysphagia/odynophagia, SOB with lying down.  She denies: - fatigue - excessive sweating/heat intolerance - tremors - anxiety - palpitations - hyperdefecation - weight loss - hair loss  Pt does have a FH of thyroid ds (hyperthyroidism). No FH of thyroid cancer. No h/o radiation tx to head or neck.  No seaweed or kelp, + recent contrast studies (Ct scan 03/02/2018). No steroid use. No herbal supplements. No Biotin use.  Pt. also has a history of osteoporosis per DXA scan from 09/2017.  She had a stress fracture in 01/2018 which evolved to a complete fracture so she is now in  a boot.  After her fracture, she used a lot of NSAIDs and she developed stomach and duodenal ulcers and actually developed a duodenal stricture.  She is now on liquid and soft food diet and she will have a duodenal stent placed at [facility].  She believes that this will be done within the next month.  She initially lost weight due to the duodenal stricture and impossibility to eat, but now on the softer diet she was able to gain a few pounds back.  ROS: Constitutional: + Both weight gain/loss, no fatigue, no subjective hyperthermia/hypothermia Eyes: no blurry vision, no xerophthalmia ENT: no sore throat, no nodules palpated in throat, no dysphagia/odynophagia, no hoarseness Cardiovascular: no CP/SOB/palpitations/leg swelling Respiratory: no cough/SOB Gastrointestinal: no N/V/D/C Musculoskeletal: no muscle/joint aches Skin: no rashes Neurological: no tremors/numbness/tingling/dizziness Psychiatric: no depression/anxiety  Past Medical History:  Diagnosis Date  . Arthritis   . Complication of anesthesia   . Difficulty sleeping    DUE TO HIP PAIN  . Headache    MIGRAINES  . History of skin cancer   . Hypertension   . PONV (postoperative nausea and vomiting)    SEVERAL YRS AGO - NONE WITH MORE RECENT SURGERIES  . Skin cancer    BCC, SCC   Past Surgical History:  Procedure Laterality Date  . ABDOMINAL HYSTERECTOMY  1981   one ovary remains  . APPENDECTOMY    . ESOPHAGOGASTRODUODENOSCOPY (EGD) WITH PROPOFOL N/A  03/02/2018   Procedure: ESOPHAGOGASTRODUODENOSCOPY (EGD) WITH PROPOFOL;  Surgeon: Doran Stabler, MD;  Location: [facility];  Service: Gastroenterology;  Laterality: N/A;  . ESOPHAGOGASTRODUODENOSCOPY (EGD) WITH PROPOFOL N/A 03/21/2018   Procedure: ESOPHAGOGASTRODUODENOSCOPY (EGD) WITH PROPOFOL;  Surgeon: Doran Stabler, MD;  Location: [facility];  Service: Gastroenterology;  Laterality: N/A;  . SKIN CANCER EXCISION    . TONSILLECTOMY AND ADENOIDECTOMY    . TOTAL  HIP ARTHROPLASTY Right 07/11/2011   HIP; [facility]  . TOTAL HIP ARTHROPLASTY Left 05/06/2015   Procedure: LEFT TOTAL HIP ARTHROPLASTY ANTERIOR APPROACH;  Surgeon: Paralee Cancel, MD;  Location: [facility];  Service: Orthopedics;  Laterality: Left;   Social History   Socioeconomic History  . Marital status: Single    Spouse name: n/a  . Number of children: 0  . Years of education: college  . Highest education level: Not on file  Occupational History  . Occupation: Aeronautical engineer: [employer]  . Occupation: IT consultant: [employer]  Social Needs  . Financial resource strain: Not on file  . Food insecurity:    Worry: Not on file    Inability: Not on file  . Transportation needs:    Medical: Not on file    Non-medical: Not on file  Tobacco Use  . Smoking status: Former Smoker    Last attempt to quit: 04/30/1994    Years since quitting: 23.9  . Smokeless tobacco: Former Systems developer    Quit date: 03/09/1993  Substance and Sexual Activity  . Alcohol use: No    Alcohol/week: 0.0 oz  . Drug use: No  . Sexual activity: Never  Lifestyle  . Physical activity:    Days per week: Not on file    Minutes per session: Not on file  . Stress: Not on file  Relationships  . Social connections:    Talks on phone: Not on file    Gets together: Not on file    Attends religious service: Not on file    Active member of club or organization: Not on file    Attends meetings of clubs or organizations: Not on file    Relationship status: Not on file  . Intimate partner violence:    Fear of current or ex partner: Not on file    Emotionally abused: Not on file    Physically abused: Not on file    Forced sexual activity: Not on file  Other Topics Concern  . Not on file  Social History Narrative   Home Environment:Lives at home alone with her two dogs. One level home with a few stairs to the front and back doors.   Diet: Stays clear of carbohydrates  and chocolate because they trigger headaches. Likes meat/chicken and vegetables. Generally healthy.   Exercise: Stays busy and active at work. Takes the stairs instead of the elevator.   Sleep: Significantly improved since hip replacement surgeries. Goes to bed around 9 and wakes up at 6. Able to work full days.   Work: Still enjoys working at [employer].   Urinary problems: No concerns. Hydrates well.      Brother lives in [place], [place]   Current Outpatient Medications on File Prior to Visit  Medication Sig Dispense Refill  . acetaminophen (TYLENOL) 325 MG tablet Take 2 tablets (650 mg total) by mouth every 6 (six) hours as needed for mild pain (or Fever >/= 101).    Marland Kitchen alendronate (FOSAMAX) 70 MG  tablet Take 1 tablet (70 mg total) by mouth every 7 (seven) days. Take with a full glass of water on an empty stomach. 12 tablet 3  . Ascorbic Acid (VITAMIN C PO) Take 1 tablet by mouth every morning. Time Release - Emiliano Dyer.    . B Complex Vitamins (VITAMIN B COMPLEX PO) Take by mouth.    Marland Kitchen CALCIUM PO Take 2 tablets by mouth every morning.    . iron polysaccharides (NIFEREX) 150 MG capsule Take 1 capsule (150 mg total) by mouth daily. 30 capsule 0  . Magnesium 250 MG TABS Take 500 mg by mouth every morning. [employer].    . Multiple Vitamin (MULTIVITAMIN WITH MINERALS) TABS tablet Take 2 tablets by mouth every morning.    . ondansetron (ZOFRAN ODT) 8 MG disintegrating tablet Take 1 tablet (8 mg total) by mouth every 8 (eight) hours as needed for nausea or vomiting. 10 tablet 0  . OVER THE COUNTER MEDICATION Take 2 tablets by mouth every morning. Skeletal Strength- Natural Sunshine.    . pantoprazole sodium (PROTONIX) 40 mg/20 mL PACK Place 20 mLs (40 mg total) into feeding tube daily. 30 each 1  . polyethylene glycol (MIRALAX) packet Take 17 g by mouth daily. 14 each 0  . SUMAtriptan (IMITREX) 100 MG tablet Take 1 as needed for migraineMay repeat in 2 hours if headache persists or recurs.  (Patient taking differently: Take 100 mg by mouth every 2 (two) hours as needed for migraine or headache. May repeat in 2 hours if headache persists or recurs.) 9 tablet 12   No current facility-administered medications on file prior to visit.    Allergies  Allergen Reactions  . Darvon [Propoxyphene Hcl] Other (See Comments)    "climb the walls"  . Vicodin [Hydrocodone-Acetaminophen] Other (See Comments)    "climb the walls"   Family History  Problem Relation Age of Onset  . Cancer Mother 65       breast cancer  . Cancer Maternal Grandmother        breast cancer  . Kidney disease Maternal Grandfather   . Arthritis Paternal Grandmother     PE: BP 132/80   Pulse 91   Ht 5\' 3"  (1.6 m)   Wt 100 lb 6.4 oz (45.5 kg)   SpO2 98%   BMI 17.79 kg/m  Wt Readings from Last 3 Encounters:  03/27/18 100 lb 6.4 oz (45.5 kg)  03/21/18 96 lb 6 oz (43.7 kg)  03/09/18 103 lb (46.7 kg)   Constitutional: Thin, in NAD Eyes: PERRLA, EOMI, no exophthalmos, no lid lag, no stare ENT: moist mucous membranes, no thyromegaly, no thyroid bruits, no cervical lymphadenopathy Cardiovascular: RRR at the end of the appointment, No MRG Respiratory: CTA B Gastrointestinal: abdomen soft, NT, ND, BS+ Musculoskeletal: no deformities, strength intact in all 4 Skin: moist, warm, no rashes Neurological: no tremor with outstretched hands, DTR normal in all 4  ASSESSMENT: 1. Thyrotoxicosis  2.  Right thyroid nodule  PLAN:  1. Patient with a history of a low TSH, without thyrotoxic sxs: weight loss, heat intolerance, hyperdefecation, palpitations, anxiety.  Her heart rate at the beginning of the appointment was 91, but towards the end, it normalized. - she does not appear to have exogenous causes for the low TSH.   - We discussed that possible causes of thyrotoxicosis are:  Graves ds   Thyroiditis (suggested on the 2015 uptake and scan) toxic multinodular goiter/ toxic adenoma (not evident on the 2015 uptake  and  scan) - We reviewed together the results of her thyroid uptake and scan from 2015 that showed possible subacute thyroiditis with the presence of a cold nodule in the right thyroid lobe - will check the TSH, fT3 and fT4 and also add thyroid stimulating antibodies to screen for Graves' disease.  - If the tests remain abnormal, we may need another uptake and scan to differentiate between the 3 above possible etiologies  - we discussed about possible modalities of treatment for the above conditions, to include methimazole use, radioactive iodine ablation or (last resort) surgery. - I do not feel that we need to add beta blockers at this time, since she is not tachycardic or tremulous - RTC in 4 months, but likely sooner for repeat labs  2. Right thyroid nodule.  - After the results of her TFTs come back, we may need another thyroid ultrasound and a possible biopsy of the right thyroid nodule.  She agrees with this.  Component     Latest Ref Rng & Units 03/27/2018  TSH     0.35 - 4.50 uIU/mL <0.01 Repeated and verified X2. (L)  T4,Free(Direct)     0.60 - 1.60 ng/dL 1.94 (H)  Triiodothyronine,Free,Serum     2.3 - 4.2 pg/mL 4.9 (H)  TSI     <140 % baseline 427 (H)   Labs confirm Graves' disease.  We can skip the uptake and scan for now and start methimazole 5 mg twice a day.  Plan to repeat her TFTs in 1.5 months.   Philemon Kingdom, MD PhD [facility] Endocrinology

## 2018-03-27 NOTE — Patient Instructions (Signed)
Please stop at the lab.  Please return in 4 months.    Hyperthyroidism Hyperthyroidism is when the thyroid is too active (overactive). Your thyroid is a large gland that is located in your neck. The thyroid helps to control how your body uses food (metabolism). When your thyroid is overactive, it produces too much of a hormone called thyroxine. What are the causes? Causes of hyperthyroidism may include:  Graves disease. This is when your immune system attacks the thyroid gland. This is the most common cause.  Inflammation of the thyroid gland.  Tumor in the thyroid gland or somewhere else.  Excessive use of thyroid medicines, including: ? Prescription thyroid supplement. ? Herbal supplements that mimic thyroid hormones.  Solid or fluid-filled lumps within your thyroid gland (thyroid nodules).  Excessive ingestion of iodine.  What increases the risk?  Being female.  Having a family history of thyroid conditions. What are the signs or symptoms? Signs and symptoms of hyperthyroidism may include:  Nervousness.  Inability to tolerate heat.  Unexplained weight loss.  Diarrhea.  Change in the texture of hair or skin.  Heart skipping beats or making extra beats.  Rapid heart rate.  Loss of menstruation.  Shaky hands.  Fatigue.  Restlessness.  Increased appetite.  Sleep problems.  Enlarged thyroid gland or nodules.  How is this diagnosed? Diagnosis of hyperthyroidism may include:  Medical history and physical exam.  Blood tests.  Ultrasound tests.  How is this treated? Treatment may include:  Medicines to control your thyroid.  Surgery to remove your thyroid.  Radiation therapy.  Follow these instructions at home:  Take medicines only as directed by your health care provider.  Do not use any tobacco products, including cigarettes, chewing tobacco, or electronic cigarettes. If you need help quitting, ask your health care provider.  Do not  exercise or do physical activity until your health care provider approves.  Keep all follow-up appointments as directed by your health care provider. This is important. Contact a health care provider if:  Your symptoms do not get better with treatment.  You have fever.  You are taking thyroid replacement medicine and you: ? Have depression. ? Feel mentally and physically slow. ? Have weight gain. Get help right away if:  You have decreased alertness or a change in your awareness.  You have abdominal pain.  You feel dizzy.  You have a rapid heartbeat.  You have an irregular heartbeat. This information is not intended to replace advice given to you by your health care provider. Make sure you discuss any questions you have with your health care provider. Document Released: 11/29/2005 Document Revised: 04/29/2016 Document Reviewed: 04/16/2014 Elsevier Interactive Patient Education  2018 [employer].

## 2018-03-29 ENCOUNTER — Telehealth: Payer: Self-pay

## 2018-03-29 DIAGNOSIS — M79604 Pain in right leg: Secondary | ICD-10-CM | POA: Diagnosis not present

## 2018-03-29 DIAGNOSIS — S82201D Unspecified fracture of shaft of right tibia, subsequent encounter for closed fracture with routine healing: Secondary | ICD-10-CM | POA: Diagnosis not present

## 2018-03-29 LAB — THYROID STIMULATING IMMUNOGLOBULIN: TSI: 427 %{baseline} — AB (ref <140)

## 2018-03-29 NOTE — Telephone Encounter (Signed)
Called [facility] to check on referral that was faxed on 4/15. They did not receive it, refaxed again today as would not take it over the phone. Will check on this referral tomorrow.

## 2018-03-30 ENCOUNTER — Encounter: Payer: Self-pay | Admitting: Physician Assistant

## 2018-03-30 DIAGNOSIS — E041 Nontoxic single thyroid nodule: Secondary | ICD-10-CM | POA: Insufficient documentation

## 2018-03-30 DIAGNOSIS — K315 Obstruction of duodenum: Secondary | ICD-10-CM | POA: Diagnosis not present

## 2018-03-30 MED ORDER — METHIMAZOLE 5 MG PO TABS: 5.0000 mg | ORAL_TABLET | Freq: Two times a day (BID) | ORAL | 3 refills | Status: DC

## 2018-04-03 ENCOUNTER — Telehealth: Payer: Self-pay | Admitting: Gastroenterology

## 2018-04-03 NOTE — Telephone Encounter (Signed)
She has not heard from the doctor in [facility] and is wondering what she should do.  Please advise.  Thank  You.

## 2018-04-03 NOTE — Telephone Encounter (Signed)
Pt would like to speak with you regarding referral for a procedure.

## 2018-04-06 NOTE — Telephone Encounter (Signed)
See note below

## 2018-04-06 NOTE — Telephone Encounter (Signed)
Pt called to inform that she was just scheduled at [facility] on Tuesday, April 30 at 11:00am.

## 2018-04-06 NOTE — Telephone Encounter (Signed)
Thank you for the update.  I am glad to hear she is scheduled for scope at [facility].  I will await the report from Dr. Lysle Rubens.

## 2018-04-11 DIAGNOSIS — Z87891 Personal history of nicotine dependence: Secondary | ICD-10-CM | POA: Diagnosis not present

## 2018-04-11 DIAGNOSIS — K219 Gastro-esophageal reflux disease without esophagitis: Secondary | ICD-10-CM | POA: Diagnosis not present

## 2018-04-11 DIAGNOSIS — Z885 Allergy status to narcotic agent status: Secondary | ICD-10-CM | POA: Diagnosis not present

## 2018-04-11 DIAGNOSIS — M810 Age-related osteoporosis without current pathological fracture: Secondary | ICD-10-CM | POA: Diagnosis not present

## 2018-04-11 DIAGNOSIS — E0590 Thyrotoxicosis, unspecified without thyrotoxic crisis or storm: Secondary | ICD-10-CM | POA: Diagnosis not present

## 2018-04-11 DIAGNOSIS — K315 Obstruction of duodenum: Secondary | ICD-10-CM | POA: Diagnosis not present

## 2018-04-21 DIAGNOSIS — K315 Obstruction of duodenum: Secondary | ICD-10-CM | POA: Diagnosis not present

## 2018-04-21 DIAGNOSIS — E079 Disorder of thyroid, unspecified: Secondary | ICD-10-CM | POA: Diagnosis not present

## 2018-04-21 DIAGNOSIS — Z87891 Personal history of nicotine dependence: Secondary | ICD-10-CM | POA: Diagnosis not present

## 2018-04-21 DIAGNOSIS — Z885 Allergy status to narcotic agent status: Secondary | ICD-10-CM | POA: Diagnosis not present

## 2018-04-21 DIAGNOSIS — Z79899 Other long term (current) drug therapy: Secondary | ICD-10-CM | POA: Diagnosis not present

## 2018-04-21 DIAGNOSIS — M810 Age-related osteoporosis without current pathological fracture: Secondary | ICD-10-CM | POA: Diagnosis not present

## 2018-04-24 ENCOUNTER — Telehealth: Payer: Self-pay | Admitting: Gastroenterology

## 2018-04-24 NOTE — Telephone Encounter (Signed)
I received the report from Dr. Lysle Rubens at [facility] of his upper endoscopy on 5/10 when he dilated the duodenal stricture (11-24-14 mm).  No stent placed.  How is she doing? What has she been able to eat?  The report indicates they will perform a repeat EGD in a week.

## 2018-04-24 NOTE — Telephone Encounter (Signed)
Spoke to patient, she states she is doing really well. She is up to 100 lbs. She is only taking in liquids, is allowed but has not tried mechanical soft foods. She has another EGD on 5/23, they are unsure if they will do some micro cutting or place a stent at that time.

## 2018-04-25 NOTE — Telephone Encounter (Signed)
Message sent to patient with recommendations.

## 2018-04-25 NOTE — Telephone Encounter (Signed)
My advice is to try the soft diet so she can have more energy and weight. No uncooked vegetables. Bananas good fruit. Sauteed spinach and cooked, mashed carrots good veggies.  Recommend stay on liquids for 2 days prior to next scope to make sure stomach entirely for any intervention needed.  Then, of course, NPO after midnight before scope as always.  She is in good hands with Dr. Lysle Rubens.

## 2018-04-28 DIAGNOSIS — M810 Age-related osteoporosis without current pathological fracture: Secondary | ICD-10-CM | POA: Diagnosis not present

## 2018-04-28 DIAGNOSIS — S82201D Unspecified fracture of shaft of right tibia, subsequent encounter for closed fracture with routine healing: Secondary | ICD-10-CM | POA: Diagnosis not present

## 2018-05-04 DIAGNOSIS — E0590 Thyrotoxicosis, unspecified without thyrotoxic crisis or storm: Secondary | ICD-10-CM | POA: Diagnosis not present

## 2018-05-04 DIAGNOSIS — K315 Obstruction of duodenum: Secondary | ICD-10-CM | POA: Diagnosis not present

## 2018-05-04 DIAGNOSIS — M810 Age-related osteoporosis without current pathological fracture: Secondary | ICD-10-CM | POA: Diagnosis not present

## 2018-05-04 DIAGNOSIS — K219 Gastro-esophageal reflux disease without esophagitis: Secondary | ICD-10-CM | POA: Diagnosis not present

## 2018-05-04 DIAGNOSIS — Z79899 Other long term (current) drug therapy: Secondary | ICD-10-CM | POA: Diagnosis not present

## 2018-05-05 ENCOUNTER — Ambulatory Visit
Admission: RE | Admit: 2018-05-05 | Discharge: 2018-05-05 | Disposition: A | Discharge status: Home / Self Care | Payer: BLUE CROSS/BLUE SHIELD | Source: Ambulatory Visit | Attending: Family | Admitting: Family

## 2018-05-05 ENCOUNTER — Other Ambulatory Visit: Payer: Self-pay | Admitting: Family

## 2018-05-05 DIAGNOSIS — R112 Nausea with vomiting, unspecified: Secondary | ICD-10-CM | POA: Diagnosis not present

## 2018-05-10 ENCOUNTER — Other Ambulatory Visit: Payer: Self-pay | Admitting: Family

## 2018-05-10 ENCOUNTER — Other Ambulatory Visit (INDEPENDENT_AMBULATORY_CARE_PROVIDER_SITE_OTHER): Payer: BLUE CROSS/BLUE SHIELD

## 2018-05-10 ENCOUNTER — Other Ambulatory Visit: Payer: Self-pay | Admitting: Internal Medicine

## 2018-05-10 DIAGNOSIS — E0590 Thyrotoxicosis, unspecified without thyrotoxic crisis or storm: Secondary | ICD-10-CM

## 2018-05-10 LAB — TSH

## 2018-05-10 LAB — T4, FREE: FREE T4: 1.13 ng/dL (ref 0.60–1.60)

## 2018-05-10 LAB — T3, FREE: T3 FREE: 3.1 pg/mL (ref 2.3–4.2)

## 2018-05-15 DIAGNOSIS — D2262 Melanocytic nevi of left upper limb, including shoulder: Secondary | ICD-10-CM | POA: Diagnosis not present

## 2018-05-15 DIAGNOSIS — D1801 Hemangioma of skin and subcutaneous tissue: Secondary | ICD-10-CM | POA: Diagnosis not present

## 2018-05-15 DIAGNOSIS — Z85820 Personal history of malignant melanoma of skin: Secondary | ICD-10-CM | POA: Diagnosis not present

## 2018-05-15 DIAGNOSIS — Z85828 Personal history of other malignant neoplasm of skin: Secondary | ICD-10-CM | POA: Diagnosis not present

## 2018-05-18 ENCOUNTER — Other Ambulatory Visit: Payer: Self-pay | Admitting: Gastroenterology

## 2018-06-05 ENCOUNTER — Other Ambulatory Visit: Payer: Self-pay | Admitting: Family

## 2018-06-05 DIAGNOSIS — E0590 Thyrotoxicosis, unspecified without thyrotoxic crisis or storm: Secondary | ICD-10-CM | POA: Diagnosis not present

## 2018-06-05 DIAGNOSIS — Z87891 Personal history of nicotine dependence: Secondary | ICD-10-CM | POA: Diagnosis not present

## 2018-06-05 DIAGNOSIS — K315 Obstruction of duodenum: Secondary | ICD-10-CM | POA: Diagnosis not present

## 2018-06-05 DIAGNOSIS — K219 Gastro-esophageal reflux disease without esophagitis: Secondary | ICD-10-CM | POA: Diagnosis not present

## 2018-06-05 DIAGNOSIS — Z4659 Encounter for fitting and adjustment of other gastrointestinal appliance and device: Secondary | ICD-10-CM | POA: Diagnosis not present

## 2018-06-05 DIAGNOSIS — T183XXA Foreign body in small intestine, initial encounter: Secondary | ICD-10-CM | POA: Diagnosis not present

## 2018-06-20 ENCOUNTER — Other Ambulatory Visit: Payer: Self-pay | Admitting: Internal Medicine

## 2018-06-20 ENCOUNTER — Other Ambulatory Visit (INDEPENDENT_AMBULATORY_CARE_PROVIDER_SITE_OTHER): Payer: BLUE CROSS/BLUE SHIELD

## 2018-06-20 DIAGNOSIS — E0590 Thyrotoxicosis, unspecified without thyrotoxic crisis or storm: Secondary | ICD-10-CM

## 2018-06-20 LAB — TSH: TSH: 0.02 u[IU]/mL — ABNORMAL LOW (ref 0.35–4.50)

## 2018-06-20 LAB — T4, FREE: Free T4: 0.55 ng/dL — ABNORMAL LOW (ref 0.60–1.60)

## 2018-06-20 LAB — T3, FREE: T3, Free: 2.6 pg/mL (ref 2.3–4.2)

## 2018-06-20 MED ORDER — METHIMAZOLE 5 MG PO TABS: 5.0000 mg | ORAL_TABLET | Freq: Every day | ORAL | 3 refills | Status: DC

## 2018-06-26 DIAGNOSIS — Z803 Family history of malignant neoplasm of breast: Secondary | ICD-10-CM | POA: Diagnosis not present

## 2018-06-26 DIAGNOSIS — Z1231 Encounter for screening mammogram for malignant neoplasm of breast: Secondary | ICD-10-CM | POA: Diagnosis not present

## 2018-06-27 DIAGNOSIS — Z1389 Encounter for screening for other disorder: Secondary | ICD-10-CM | POA: Diagnosis not present

## 2018-06-27 DIAGNOSIS — Z13228 Encounter for screening for other metabolic disorders: Secondary | ICD-10-CM | POA: Diagnosis not present

## 2018-06-27 DIAGNOSIS — Z0000 Encounter for general adult medical examination without abnormal findings: Secondary | ICD-10-CM | POA: Diagnosis not present

## 2018-06-27 DIAGNOSIS — Z13220 Encounter for screening for lipoid disorders: Secondary | ICD-10-CM | POA: Diagnosis not present

## 2018-06-28 DIAGNOSIS — E7849 Other hyperlipidemia: Secondary | ICD-10-CM | POA: Insufficient documentation

## 2018-07-10 ENCOUNTER — Other Ambulatory Visit: Payer: Self-pay | Admitting: Family

## 2018-07-10 ENCOUNTER — Ambulatory Visit
Admission: RE | Admit: 2018-07-10 | Discharge: 2018-07-10 | Disposition: A | Discharge status: Home / Self Care | Payer: BLUE CROSS/BLUE SHIELD | Source: Ambulatory Visit | Attending: Family | Admitting: Family

## 2018-07-10 DIAGNOSIS — R635 Abnormal weight gain: Secondary | ICD-10-CM | POA: Diagnosis not present

## 2018-07-10 DIAGNOSIS — K315 Obstruction of duodenum: Secondary | ICD-10-CM

## 2018-07-17 ENCOUNTER — Telehealth: Payer: Self-pay | Admitting: Gastroenterology

## 2018-07-17 ENCOUNTER — Other Ambulatory Visit: Payer: Self-pay | Admitting: Gastroenterology

## 2018-07-17 NOTE — Telephone Encounter (Signed)
Please refill the PPI and suggest she contact Dr. Glenna Durand office re: UGIS results and plan.

## 2018-07-17 NOTE — Telephone Encounter (Signed)
Done

## 2018-07-17 NOTE — Telephone Encounter (Signed)
Rec'd refill request for protonix suspension.  Please contact her, find out if she is still taking it, or if perhaps can be chamged to tablets.  She ahs been under care of Dr. Lysle Rubens (Faculty GI at [facility]), and I think she may have had a repeat EGD with him recently.  I do not have the report, so do not know the plan for her stricture and the medicines it needs.

## 2018-08-15 ENCOUNTER — Ambulatory Visit: Payer: BLUE CROSS/BLUE SHIELD | Admitting: Internal Medicine

## 2018-08-15 ENCOUNTER — Encounter: Payer: Self-pay | Admitting: Internal Medicine

## 2018-08-15 VITALS — BP 138/74 | HR 81 | Ht 63.0 in | Wt 121.0 lb

## 2018-08-15 DIAGNOSIS — E0500 Thyrotoxicosis with diffuse goiter without thyrotoxic crisis or storm: Secondary | ICD-10-CM | POA: Diagnosis not present

## 2018-08-15 DIAGNOSIS — E041 Nontoxic single thyroid nodule: Secondary | ICD-10-CM | POA: Diagnosis not present

## 2018-08-15 LAB — T3, FREE: T3 FREE: 5.2 pg/mL — AB (ref 2.3–4.2)

## 2018-08-15 LAB — T4, FREE: Free T4: 1.28 ng/dL (ref 0.60–1.60)

## 2018-08-15 LAB — TSH: TSH: <0.01 u[IU]/mL — ABNORMAL LOW (ref 0.35–4.50)

## 2018-08-15 MED ORDER — METHIMAZOLE 5 MG PO TABS: ORAL_TABLET | ORAL | 3 refills | Status: DC

## 2018-08-15 NOTE — Patient Instructions (Signed)
Please stop at the lab.  Please continue Methimazole 5 mg daily.  Please return in 6 months.

## 2018-08-15 NOTE — Progress Notes (Signed)
Patient ID: Carol King, female   DOB: 08-23-1951, 67 y.o.   MRN: [id number]    HPI  Carol King is a 67 y.o.-year-old femalele, returning for follow-up for Graves' disease and right thyroid nodule.  She was previously seen by Dr. Dwyane Dee once, in 09/2014.  Last visit with me 4 months ago.  She can finally eat solid food after her duodenal stent was removed in 05/2018.  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  Before then, patient lost a lot of weight that she could not eat.  Reviewed and addended history: She cannot remember when she was first told her thyroid tests are abnormal.  Per my records, the first documented abnormal TSH was in 2014.  Since then, she saw Dr. Dwyane Dee in 2015 and he checked a thyroid uptake and scan and a thyroid ultrasound.  She did not return to see him afterwards.  TFTs in 2017 in 2018 were worse.  At last visit, we started methimazole 5 mg twice a day, and we were able to decrease the dose in 06/2018 to 5 mg once a day.  She denies any side effects to methimazole.  I reviewed patient's TFTs: Lab Results  Component Value Date   TSH 0.02 (L) 06/20/2018   TSH <0.01 (L) 05/10/2018   TSH <0.01 Repeated and verified X2. (L) 03/27/2018   TSH <0.006 (L) 06/10/2017   TSH 0.03 (L) 06/08/2016   TSH 0.011 (L) 07/15/2014   TSH 0.288 (L) 04/19/2013   FREET4 0.55 (L) 06/20/2018   FREET4 1.13 05/10/2018   FREET4 1.94 (H) 03/27/2018   FREET4 3.51 (H) 06/10/2017   FREET4 1.9 (H) 06/08/2016   FREET4 1.17 09/18/2014   T3FREE 2.6 06/20/2018   T3FREE 3.1 05/10/2018   T3FREE 4.9 (H) 03/27/2018   T3FREE 4.0 06/08/2016   T3FREE 2.6 09/18/2014   Her Graves' antibodies were elevated: Lab Results  Component Value Date   TSI 427 (H) 03/27/2018    Thyroid uptake and scan (10/08/2014): Uptake 9.9%, consistent with subacute thyroiditis.  Possible cold nodule in the lower right lobe.  Thyroid ultrasound (10/21/2014): 1.8 cm right thyroid nodule,  corresponding to the cold nodule on the thyroid scan.  At that time, she was advised to have an FNA of this nodule, but she refused.  Pt denies: - feeling nodules in neck - hoarseness - dysphagia - choking - SOB with lying down  Pt denies: - weight loss - heat intolerance - tremors - palpitations - anxiety - hyperdefecation - hair loss  Pt does have a FH of thyroid ds (hyperthyroidism). No FH of thyroid cancer. No h/o radiation tx to head or neck.  No seaweed or kelp. No recent contrast studies. No herbal supplements. No Biotin use. No recent steroids use.   Pt. also has a history of osteoporosis per DXA scan from 09/2017.  She had a stress fracture in 01/2018 which evolved to a complete fracture so she is now in a boot.  After her fracture, she used a lot of NSAIDs and she developed stomach and duodenal ulcers and actually developed a duodenal stricture.   ROS: Constitutional: + weight gain/no weight loss, no fatigue, no subjective hyperthermia, no subjective hypothermia Eyes: no blurry vision, no xerophthalmia ENT: no sore throat, + see HPI Cardiovascular: no CP/no SOB/no palpitations/no leg swelling Respiratory: no cough/no SOB/no wheezing Gastrointestinal: no N/no V/no D/no C/no acid reflux Musculoskeletal: no muscle aches/no joint aches Skin: no rashes, no hair loss Neurological: no tremors/no numbness/no  tingling/no dizziness  I reviewed pt's medications, allergies, PMH, social hx, family hx, and changes were documented in the history of present illness. Otherwise, unchanged from my initial visit note.  Past Medical History:  Diagnosis Date  . Arthritis   . Complication of anesthesia   . Difficulty sleeping    DUE TO HIP PAIN  . Headache    MIGRAINES  . History of skin cancer   . Hypertension   . PONV (postoperative nausea and vomiting)    SEVERAL YRS AGO - NONE WITH MORE RECENT SURGERIES  . Skin cancer    BCC, SCC   Past Surgical History:  Procedure  Laterality Date  . ABDOMINAL HYSTERECTOMY  1981   one ovary remains  . APPENDECTOMY    . ESOPHAGOGASTRODUODENOSCOPY (EGD) WITH PROPOFOL N/A 03/02/2018   Procedure: ESOPHAGOGASTRODUODENOSCOPY (EGD) WITH PROPOFOL;  Surgeon: Doran Stabler, MD;  Location: [facility];  Service: Gastroenterology;  Laterality: N/A;  . ESOPHAGOGASTRODUODENOSCOPY (EGD) WITH PROPOFOL N/A 03/21/2018   Procedure: ESOPHAGOGASTRODUODENOSCOPY (EGD) WITH PROPOFOL;  Surgeon: Doran Stabler, MD;  Location: [facility];  Service: Gastroenterology;  Laterality: N/A;  . SKIN CANCER EXCISION    . TONSILLECTOMY AND ADENOIDECTOMY    . TOTAL HIP ARTHROPLASTY Right 07/11/2011   HIP; [facility]  . TOTAL HIP ARTHROPLASTY Left 05/06/2015   Procedure: LEFT TOTAL HIP ARTHROPLASTY ANTERIOR APPROACH;  Surgeon: Paralee Cancel, MD;  Location: [facility];  Service: Orthopedics;  Laterality: Left;   Social History   Socioeconomic History  . Marital status: Single    Spouse name: n/a  . Number of children: 0  . Years of education: college  . Highest education level: Not on file  Occupational History  . Occupation: Aeronautical engineer: [employer]  . Occupation: IT consultant: [employer]  Social Needs  . Financial resource strain: Not on file  . Food insecurity:    Worry: Not on file    Inability: Not on file  . Transportation needs:    Medical: Not on file    Non-medical: Not on file  Tobacco Use  . Smoking status: Former Smoker    Last attempt to quit: 04/30/1994    Years since quitting: 24.3  . Smokeless tobacco: Former Systems developer    Quit date: 03/09/1993  Substance and Sexual Activity  . Alcohol use: No    Alcohol/week: 0.0 standard drinks  . Drug use: No  . Sexual activity: Never  Lifestyle  . Physical activity:    Days per week: Not on file    Minutes per session: Not on file  . Stress: Not on file  Relationships  . Social connections:    Talks on phone: Not on file     Gets together: Not on file    Attends religious service: Not on file    Active member of club or organization: Not on file    Attends meetings of clubs or organizations: Not on file    Relationship status: Not on file  . Intimate partner violence:    Fear of current or ex partner: Not on file    Emotionally abused: Not on file    Physically abused: Not on file    Forced sexual activity: Not on file  Other Topics Concern  . Not on file  Social History Narrative   Home Environment:Lives at home alone with her two dogs. One level home with a few stairs to the front and back  doors.   Diet: Stays clear of carbohydrates and chocolate because they trigger headaches. Likes meat/chicken and vegetables. Generally healthy.   Exercise: Stays busy and active at work. Takes the stairs instead of the elevator.   Sleep: Significantly improved since hip replacement surgeries. Goes to bed around 9 and wakes up at 6. Able to work full days.   Work: Still enjoys working at [employer].   Urinary problems: No concerns. Hydrates well.      Brother lives in [place], [place]   Current Outpatient Medications on File Prior to Visit  Medication Sig Dispense Refill  . acetaminophen (TYLENOL) 325 MG tablet Take 2 tablets (650 mg total) by mouth every 6 (six) hours as needed for mild pain (or Fever >/= 101).    Marland Kitchen alendronate (FOSAMAX) 70 MG tablet Take 1 tablet (70 mg total) by mouth every 7 (seven) days. Take with a full glass of water on an empty stomach. 12 tablet 3  . methimazole (TAPAZOLE) 5 MG tablet Take 1 tablet (5 mg total) by mouth daily. 60 tablet 3  . PROTONIX 40 MG PACK PLACE 20 MLS (40 MG TOTAL) INTO FEEDING TUBE DAILY. 30 each 1  . SUMAtriptan (IMITREX) 100 MG tablet Take 1 as needed for migraineMay repeat in 2 hours if headache persists or recurs. (Patient taking differently: Take 100 mg by mouth every 2 (two) hours as needed for migraine or headache. May repeat in 2 hours if headache persists or recurs.)  9 tablet 12   No current facility-administered medications on file prior to visit.    Allergies  Allergen Reactions  . Darvon [Propoxyphene Hcl] Other (See Comments)    "climb the walls"  . Vicodin [Hydrocodone-Acetaminophen] Other (See Comments)    "climb the walls"   Family History  Problem Relation Age of Onset  . Cancer Mother 71       breast cancer  . Cancer Maternal Grandmother        breast cancer  . Kidney disease Maternal Grandfather   . Arthritis Paternal Grandmother     PE: BP 138/74   Pulse 81   Ht 5\' 3"  (1.6 m)   Wt 121 lb (54.9 kg)   SpO2 97%   BMI 21.43 kg/m  Wt Readings from Last 3 Encounters:  08/15/18 121 lb (54.9 kg)  03/27/18 100 lb 6.4 oz (45.5 kg)  03/21/18 96 lb 6 oz (43.7 kg)   Constitutional: Normal weight, in NAD Eyes: PERRLA, EOMI, no exophthalmos, no lid lag, no stare ENT: moist mucous membranes, no thyromegaly, no cervical lymphadenopathy Cardiovascular: RRR, No MRG Respiratory: CTA B Gastrointestinal: abdomen soft, NT, ND, BS+ Musculoskeletal: no deformities, strength intact in all 4 Skin: moist, warm, no rashes Neurological: no tremor with outstretched hands, DTR normal in all 4   ASSESSMENT: 1. Thyrotoxicosis  2.  Right thyroid nodule  PLAN:  1. Patient with a history of low TSH, without thyrotoxic symptoms: No weight loss, heat intolerance, hyper defecation, palpitations, anxiety. At last visit, we checked her TSI antibodies and these were elevated, pointing towards Graves' disease.  We did not repeat an uptake and scan at last visit.  Of note, she had a thyroid uptake and scan performed in 2015 which suggested thyroiditis.  She also had a cold right thyroid nodule at that time (see next problem). -At last visit, we started methimazole 5 mg twice a day and we were able to decrease the dose to 5 mg once a day on 06/20/2018.  Of note, patient  gained approximately 25 pounds but this is most likely due to her improved diet after the  duodenal stent, rather than resolution of her Graves' disease. -At this visit, we will repeat her TFTs.  -We again discussed about possible modalities of treatment for Graves' disease, to include methimazole use, RAI treatment or, last resort, surgery.  Since she responded so well to methimazole, for now, we will continue with this medication.  She has no side effects from this. -No need for beta-blocker since she is not tachycardic or tremulous - RTC in 6 months, but likely sooner for repeat labs  2. Right thyroid nodule.  -She will need a thyroid ultrasound, but I would prefer to wait to do this after she becomes euthyroid.  We discussed to check the thyroid ultrasound at next visit -No neck compression symptoms  Component     Latest Ref Rng & Units 08/15/2018  TSH     0.35 - 4.50 uIU/mL <0.01 (L)  T4,Free(Direct)     0.60 - 1.60 ng/dL 1.28  Triiodothyronine,Free,Serum     2.3 - 4.2 pg/mL 5.2 (H)  Thyroid tests are still abnormal.  We will try to increase the methimazole to 5 mg in a.m. and 2.5 mg in p.m. and check her tests again in 1.5 months.  Philemon Kingdom, MD PhD [facility] Endocr[facility]

## 2018-08-21 DIAGNOSIS — Z85828 Personal history of other malignant neoplasm of skin: Secondary | ICD-10-CM | POA: Diagnosis not present

## 2018-08-21 DIAGNOSIS — D225 Melanocytic nevi of trunk: Secondary | ICD-10-CM | POA: Diagnosis not present

## 2018-08-21 DIAGNOSIS — L570 Actinic keratosis: Secondary | ICD-10-CM | POA: Diagnosis not present

## 2018-08-21 DIAGNOSIS — D045 Carcinoma in situ of skin of trunk: Secondary | ICD-10-CM | POA: Diagnosis not present

## 2018-08-21 DIAGNOSIS — L821 Other seborrheic keratosis: Secondary | ICD-10-CM | POA: Diagnosis not present

## 2018-08-21 DIAGNOSIS — Z85820 Personal history of malignant melanoma of skin: Secondary | ICD-10-CM | POA: Diagnosis not present

## 2018-08-23 ENCOUNTER — Telehealth: Payer: Self-pay

## 2018-08-23 NOTE — Telephone Encounter (Signed)
Incoming fax request for Protonix 40 mg tablets as the suspension is on back order. Please advise.

## 2018-08-24 MED ORDER — PANTOPRAZOLE SODIUM 40 MG PO TBEC: 40.0000 mg | DELAYED_RELEASE_TABLET | Freq: Two times a day (BID) | ORAL | 0 refills | Status: DC

## 2018-08-24 NOTE — Telephone Encounter (Signed)
I have ordered tablets.  She has had dilation and stent placement for the stricture, so I think tablets will be able to get through.

## 2018-08-25 ENCOUNTER — Telehealth: Payer: Self-pay | Admitting: *Deleted

## 2018-09-16 ENCOUNTER — Other Ambulatory Visit: Payer: Self-pay | Admitting: Gastroenterology

## 2018-09-26 ENCOUNTER — Other Ambulatory Visit: Payer: BLUE CROSS/BLUE SHIELD

## 2018-11-20 DIAGNOSIS — L821 Other seborrheic keratosis: Secondary | ICD-10-CM | POA: Diagnosis not present

## 2018-11-20 DIAGNOSIS — Z85820 Personal history of malignant melanoma of skin: Secondary | ICD-10-CM | POA: Diagnosis not present

## 2018-11-20 DIAGNOSIS — L814 Other melanin hyperpigmentation: Secondary | ICD-10-CM | POA: Diagnosis not present

## 2018-11-20 DIAGNOSIS — L570 Actinic keratosis: Secondary | ICD-10-CM | POA: Diagnosis not present

## 2018-11-20 DIAGNOSIS — D485 Neoplasm of uncertain behavior of skin: Secondary | ICD-10-CM | POA: Diagnosis not present

## 2018-11-20 DIAGNOSIS — Z85828 Personal history of other malignant neoplasm of skin: Secondary | ICD-10-CM | POA: Diagnosis not present

## 2018-12-11 DIAGNOSIS — Z01419 Encounter for gynecological examination (general) (routine) without abnormal findings: Secondary | ICD-10-CM | POA: Diagnosis not present

## 2018-12-11 DIAGNOSIS — Z6822 Body mass index (BMI) 22.0-22.9, adult: Secondary | ICD-10-CM | POA: Diagnosis not present

## 2019-02-13 ENCOUNTER — Ambulatory Visit: Payer: BLUE CROSS/BLUE SHIELD | Admitting: Internal Medicine

## 2019-02-28 ENCOUNTER — Other Ambulatory Visit: Payer: Self-pay | Admitting: Internal Medicine

## 2019-05-25 ENCOUNTER — Ambulatory Visit: Payer: BLUE CROSS/BLUE SHIELD | Admitting: Internal Medicine

## 2019-06-04 DIAGNOSIS — C4441 Basal cell carcinoma of skin of scalp and neck: Secondary | ICD-10-CM | POA: Diagnosis not present

## 2019-06-04 DIAGNOSIS — Z85828 Personal history of other malignant neoplasm of skin: Secondary | ICD-10-CM | POA: Diagnosis not present

## 2019-06-04 DIAGNOSIS — Z85820 Personal history of malignant melanoma of skin: Secondary | ICD-10-CM | POA: Diagnosis not present

## 2019-06-04 DIAGNOSIS — L814 Other melanin hyperpigmentation: Secondary | ICD-10-CM | POA: Diagnosis not present

## 2019-06-04 DIAGNOSIS — L821 Other seborrheic keratosis: Secondary | ICD-10-CM | POA: Diagnosis not present

## 2019-06-20 DIAGNOSIS — C4441 Basal cell carcinoma of skin of scalp and neck: Secondary | ICD-10-CM | POA: Diagnosis not present

## 2019-07-04 DIAGNOSIS — Z803 Family history of malignant neoplasm of breast: Secondary | ICD-10-CM | POA: Diagnosis not present

## 2019-07-04 DIAGNOSIS — Z1231 Encounter for screening mammogram for malignant neoplasm of breast: Secondary | ICD-10-CM | POA: Diagnosis not present

## 2019-07-06 DIAGNOSIS — E7849 Other hyperlipidemia: Secondary | ICD-10-CM | POA: Diagnosis not present

## 2019-07-06 DIAGNOSIS — E0500 Thyrotoxicosis with diffuse goiter without thyrotoxic crisis or storm: Secondary | ICD-10-CM | POA: Diagnosis not present

## 2019-07-06 DIAGNOSIS — M810 Age-related osteoporosis without current pathological fracture: Secondary | ICD-10-CM | POA: Diagnosis not present

## 2019-07-06 DIAGNOSIS — Z0000 Encounter for general adult medical examination without abnormal findings: Secondary | ICD-10-CM | POA: Diagnosis not present

## 2019-07-30 ENCOUNTER — Encounter: Payer: Self-pay | Admitting: Internal Medicine

## 2019-07-30 ENCOUNTER — Other Ambulatory Visit: Payer: Self-pay | Admitting: Internal Medicine

## 2019-07-30 NOTE — Progress Notes (Signed)
Received labs from PCP from 07/11/2019: TSH <0.006, total T3 146 (71-180), total T4 11.3 (4.5-12) The rest of the labs will be scanned in Epic.  Per my records, she is on methimazole 5 mg in a.m. and 2.5 mg in p.m. We will increase the dose to 5 mg twice a day and will recheck at next visit, which will be next month.

## 2019-08-31 ENCOUNTER — Ambulatory Visit: Payer: BC Managed Care – PPO | Admitting: Internal Medicine

## 2019-09-10 DIAGNOSIS — D2262 Melanocytic nevi of left upper limb, including shoulder: Secondary | ICD-10-CM | POA: Diagnosis not present

## 2019-09-10 DIAGNOSIS — L570 Actinic keratosis: Secondary | ICD-10-CM | POA: Diagnosis not present

## 2019-09-10 DIAGNOSIS — L821 Other seborrheic keratosis: Secondary | ICD-10-CM | POA: Diagnosis not present

## 2019-09-10 DIAGNOSIS — Z85828 Personal history of other malignant neoplasm of skin: Secondary | ICD-10-CM | POA: Diagnosis not present

## 2019-09-10 DIAGNOSIS — Z85820 Personal history of malignant melanoma of skin: Secondary | ICD-10-CM | POA: Diagnosis not present

## 2019-10-04 IMAGING — RF DG UGI W/ GASTROGRAFIN
13 of 17 series · 14 of 24 positions shown · IV contrast (iopamidol)
Comparison: CT Abdomen and Pelvis 03/02/2018.

ADDENDUM:
Study discussed by telephone with Dr. Mays, Og for Dr. TEDIM
[id number] SAGARADZE) on 03/02/2018 at 9475 hours.
CLINICAL DATA: 66-year-old female with high-grade stricture in the
proximal duodenum encountered on endoscopy today. The stenosis was
unable to be traversed by adult or pediatric endoscope.

EXAM:
WATER SOLUBLE UPPER GI SERIES
TECHNIQUE: Single-column upper GI series was performed using water soluble
contrast.
CONTRAST:  100mL [id number] IOPAMIDOL (Y6PFGV-[id number]) INJECTION 61%

[Series 1: t abdomen supine · 0.15mm/px · 1 of 1 slices shown]
[im 1/1]
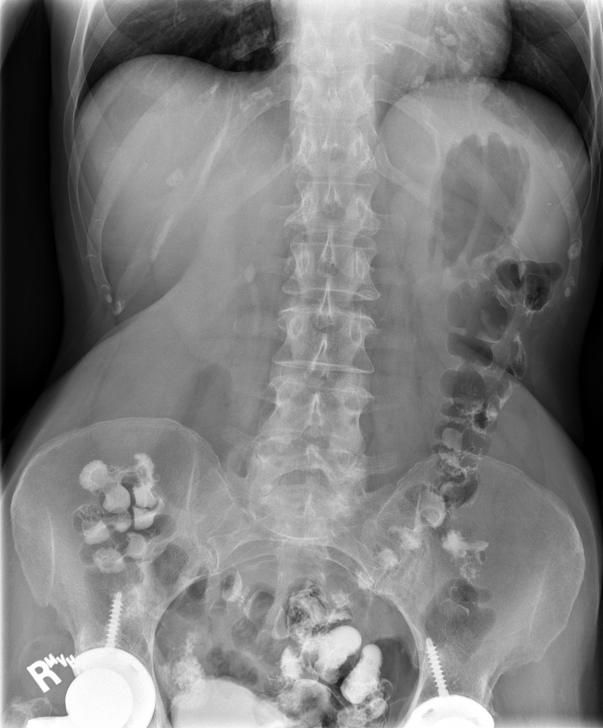

[Series 3: cp_standard · 0.53mm/px · 1 of 37 frames shown (1 of 12)]
[frame 32/37]
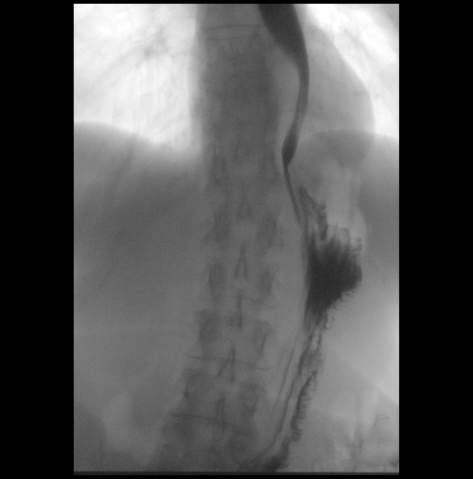

[Series 6: cp_standard · 0.27mm/px · 1 of 1 slices shown (2 of 12)]
[im 1/1]
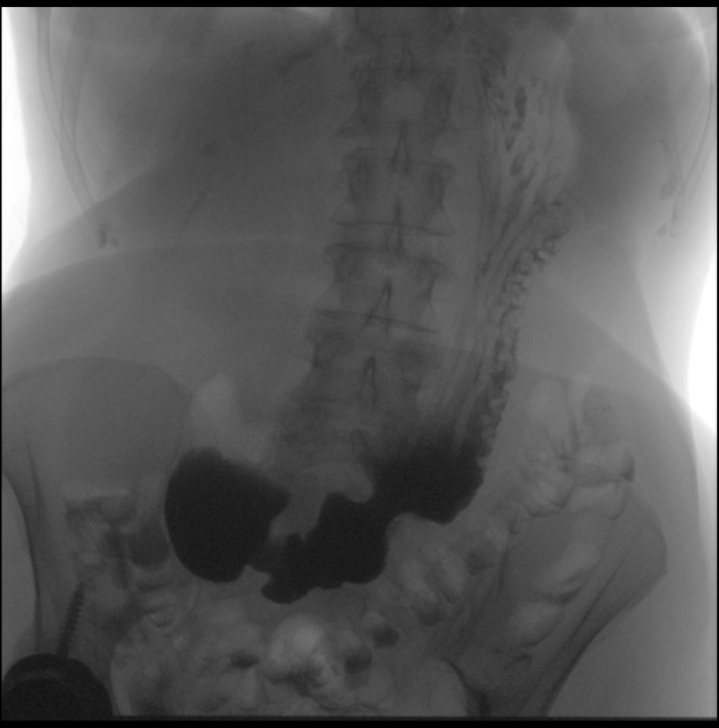

[Series 9: cp_standard · 0.18mm/px · 1 of 1 slices shown (3 of 12)]
[im 1/1]
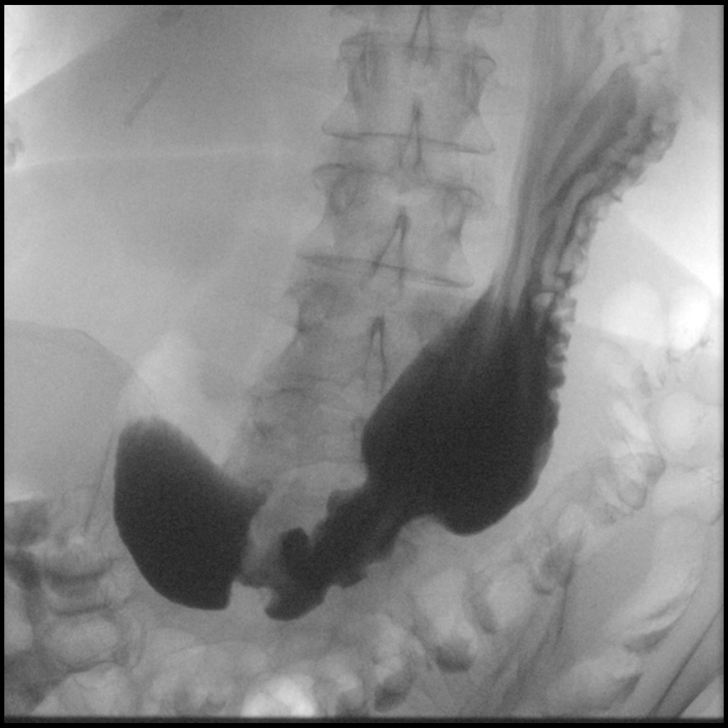

[Series 11: cp_standard · 0.18mm/px · 1 of 1 slices shown (4 of 12)]
[im 1/1]
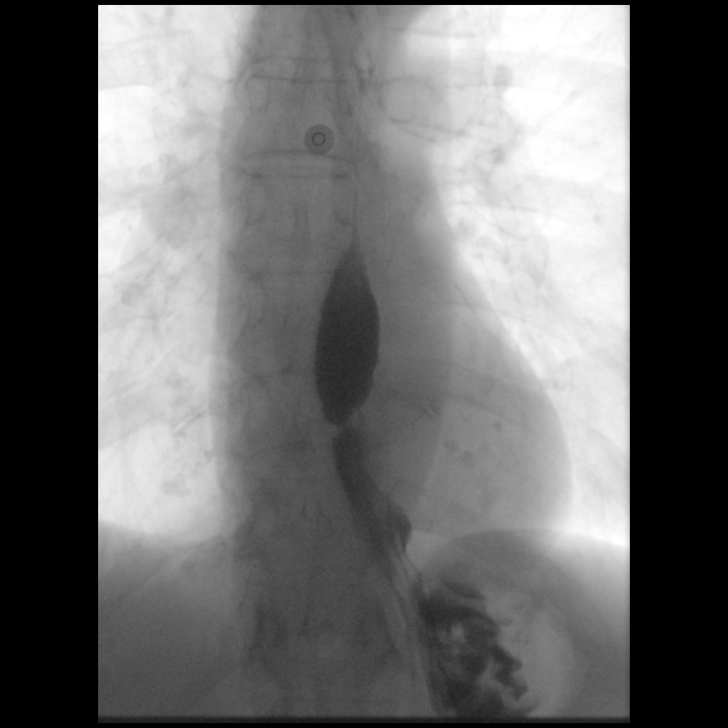

[Series 15: cp_standard · 0.36mm/px · 1 of 23 frames shown (5 of 12)]
[frame 1/23]
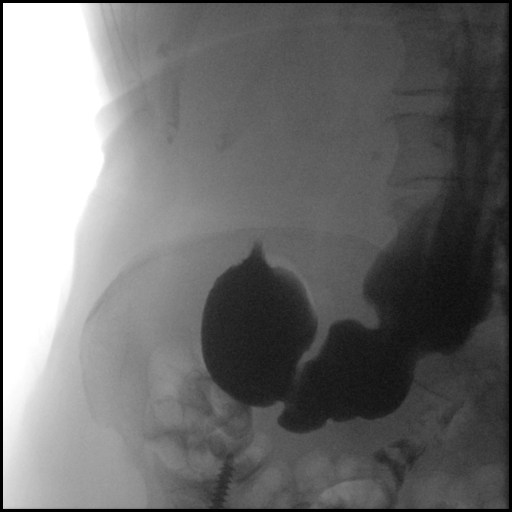

[Series 16: cp_standard · 0.36mm/px · 2 of 16 frames shown (6 of 12)]
[frame 3/16]
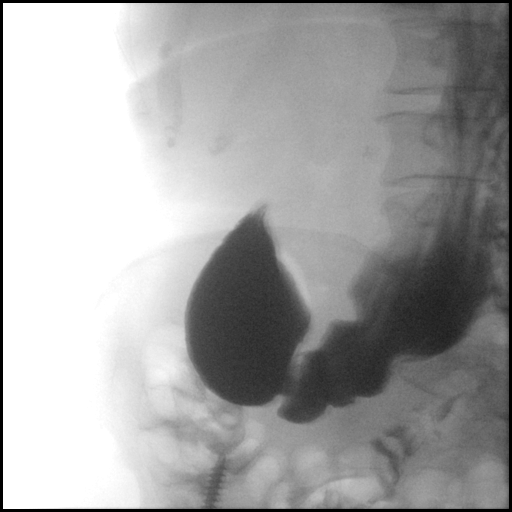
[frame 14/16]
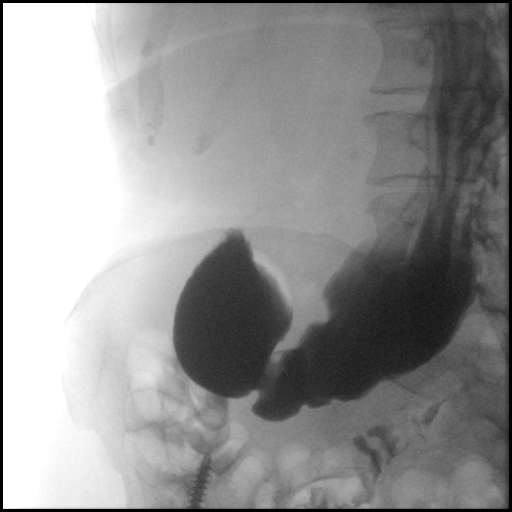

[Series 17: cp_standard · 0.36mm/px · 1 of 13 frames shown (7 of 12)]
[frame 12/13]
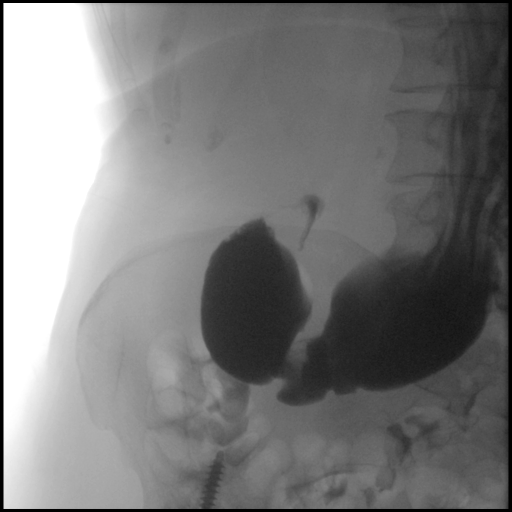

[Series 19: cp_standard · 0.36mm/px · 1 of 30 frames shown (8 of 12)]
[frame 16/30]
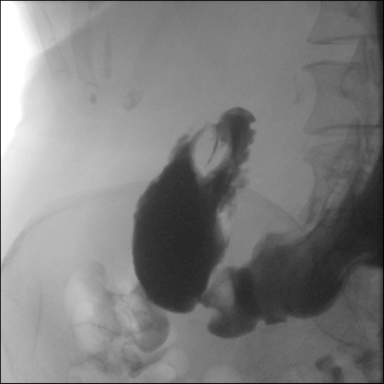

[Series 20: cp_standard · 0.18mm/px · 1 of 1 slices shown (9 of 12)]
[im 1/1]
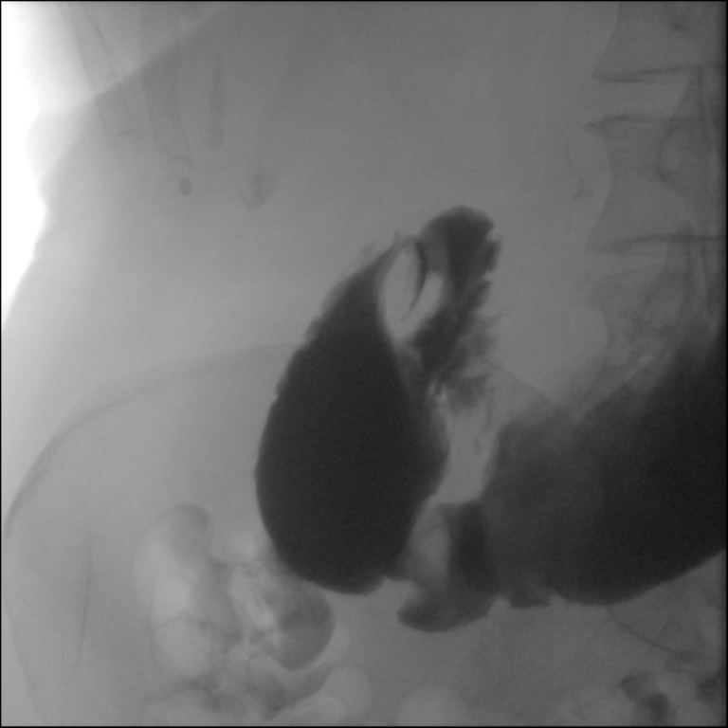

[Series 21: cp_standard · 0.36mm/px · 1 of 38 frames shown (10 of 12)]
[frame 20/38]
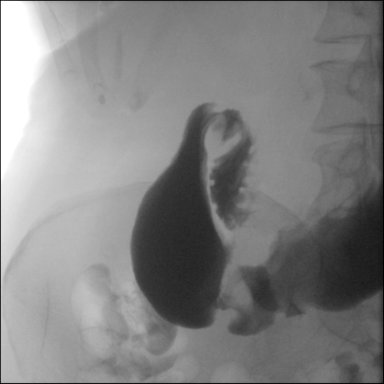

[Series 23: cp_standard · 0.18mm/px · 1 of 1 slices shown (11 of 12)]
[im 1/1]
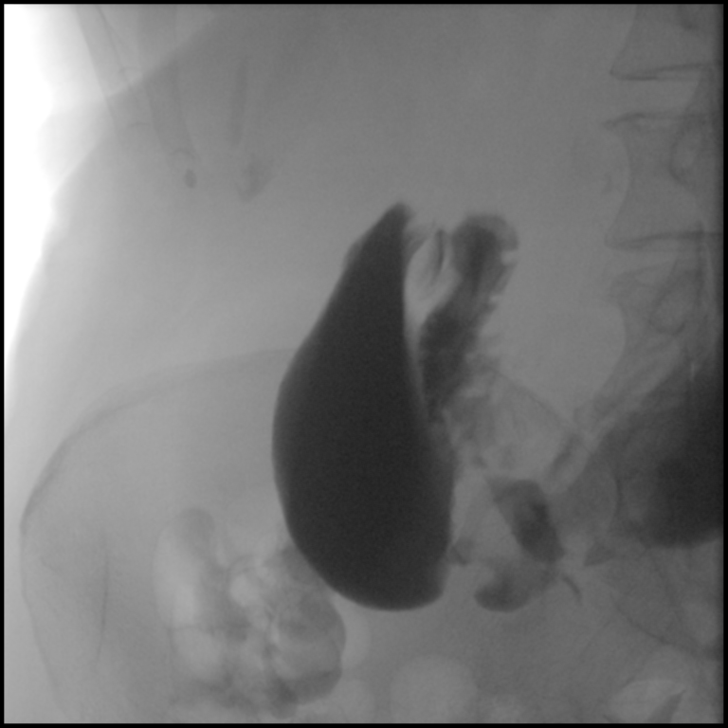

[Series 24: cp_standard · 0.37mm/px · 1 of 62 frames shown (12 of 12)]
[frame 53/62]
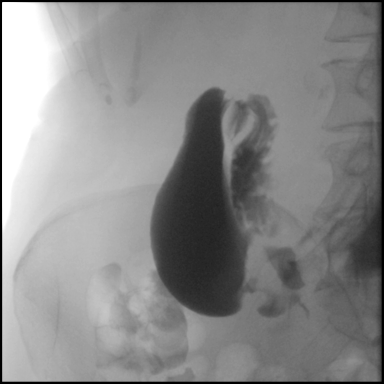

[14 of 24 positions shown; findings below may reference images not displayed]

Report of EGD earlier
today

FLUOROSCOPY TIME:  Fluoroscopy Time:  2 minutes 48 seconds

Radiation Exposure Index (if provided by the fluoroscopic device):
35.3 mGy

Number of Acquired Spot Images: 0
FINDINGS: Preprocedural scout view of the abdomen. Oral contrast from the CT
Abdomen and Pelvis at 6030 hours today is now present throughout the
large bowel. Non obstructed bowel gas pattern. Negative lung bases.

A single contrast study was undertaken with water-soluble contrast
and the patient tolerated this well and without difficulty.

No obstruction to the forward flow of contrast throughout the
esophagus and into the stomach. Normal esophageal course and
contour. Mild circumferential narrowing at the gastroesophageal
junction might correspond to a Schatzki ring described on the
endoscopy today (series 11).

Prompt transit of contrast from proximal to the distal stomach and
into the duodenum bulb which is dilated (series 7). The gastric
pylorus seem to be continuously patent throughout the study.

The patient was positioned RPO, and small volumes of contrast began
to traverse a short segment severe stenosis. The stenosis length is
estimated at 5-6 millimeters (series 26). To and fro motion of
contrast in the stomach and duodenum bulb occurred throughout this
time.

Occasionally, a small round sacculation was opacified in conjunction
with the stenosis and this is seen on series 30 cine images (image
88). Compare this appearance to sagittal image 36 of the earlier CT
Abdomen and Pelvis which demonstrates the short segment stenosis (4
millimeters) and lateral to it a small linear appearing sacculation
(image 34).

Over the course of 7 minutes only a small volume of the oral
contrast had traversed the stricture.
IMPRESSION: 1. Short-segment very severe stricture in the proximal duodenum just
distal to a dilated duodenum bulb. The stricture appears to be less
than 5-6 mm in length, and has a small disc like sacculation
associated.
2. Compared the images on this exam to sagittal images 34 through 36
on the CT Abdomen and Pelvis earlier today.

## 2019-10-10 ENCOUNTER — Ambulatory Visit: Payer: BC Managed Care – PPO | Admitting: Internal Medicine

## 2019-12-24 DIAGNOSIS — D224 Melanocytic nevi of scalp and neck: Secondary | ICD-10-CM | POA: Diagnosis not present

## 2019-12-24 DIAGNOSIS — Z85828 Personal history of other malignant neoplasm of skin: Secondary | ICD-10-CM | POA: Diagnosis not present

## 2019-12-24 DIAGNOSIS — L814 Other melanin hyperpigmentation: Secondary | ICD-10-CM | POA: Diagnosis not present

## 2019-12-24 DIAGNOSIS — D485 Neoplasm of uncertain behavior of skin: Secondary | ICD-10-CM | POA: Diagnosis not present

## 2019-12-24 DIAGNOSIS — L820 Inflamed seborrheic keratosis: Secondary | ICD-10-CM | POA: Diagnosis not present

## 2019-12-24 DIAGNOSIS — Z85820 Personal history of malignant melanoma of skin: Secondary | ICD-10-CM | POA: Diagnosis not present

## 2019-12-24 DIAGNOSIS — L570 Actinic keratosis: Secondary | ICD-10-CM | POA: Diagnosis not present

## 2020-01-01 ENCOUNTER — Ambulatory Visit: Payer: BC Managed Care – PPO | Attending: Internal Medicine

## 2020-01-01 DIAGNOSIS — Z23 Encounter for immunization: Secondary | ICD-10-CM | POA: Insufficient documentation

## 2020-01-01 NOTE — Progress Notes (Signed)
   Covid-19 Vaccination Clinic  Name:  Carol King    MRN: [id number] DOB: 04/19/1951  01/01/2020  [name] was observed post Covid-19 immunization for 15 minutes without incidence. She was provided with Vaccine Information Sheet and instruction to access the V-Safe system.   [name] was instructed to call 911 with any severe reactions post vaccine: Marland Kitchen Difficulty breathing  . Swelling of your face and throat  . A fast heartbeat  . A bad rash all over your body  . Dizziness and weakness    Immunizations Administered    Name Date Dose VIS Date Route   Pfizer COVID-19 Vaccine 01/01/2020  5:31 PM 0.3 mL 11/23/2019 Intramuscular   Manufacturer: [place]   Lot: [id number]   [place]: [id number]

## 2020-01-05 DIAGNOSIS — Z20828 Contact with and (suspected) exposure to other viral communicable diseases: Secondary | ICD-10-CM | POA: Diagnosis not present

## 2020-01-05 DIAGNOSIS — Z03818 Encounter for observation for suspected exposure to other biological agents ruled out: Secondary | ICD-10-CM | POA: Diagnosis not present

## 2020-01-14 DIAGNOSIS — Z01419 Encounter for gynecological examination (general) (routine) without abnormal findings: Secondary | ICD-10-CM | POA: Diagnosis not present

## 2020-01-14 DIAGNOSIS — Z6822 Body mass index (BMI) 22.0-22.9, adult: Secondary | ICD-10-CM | POA: Diagnosis not present

## 2020-01-14 DIAGNOSIS — M810 Age-related osteoporosis without current pathological fracture: Secondary | ICD-10-CM | POA: Diagnosis not present

## 2020-01-21 ENCOUNTER — Ambulatory Visit: Payer: BC Managed Care – PPO | Attending: Internal Medicine

## 2020-01-21 DIAGNOSIS — Z23 Encounter for immunization: Secondary | ICD-10-CM | POA: Insufficient documentation

## 2020-01-21 NOTE — Progress Notes (Signed)
   Covid-19 Vaccination Clinic  Name:  Carol King    MRN: [id number] DOB: 1951-05-26  01/21/2020  [name] was observed post Covid-19 immunization for 15 minutes without incidence. She was provided with Vaccine Information Sheet and instruction to access the V-Safe system.   [name] was instructed to call 911 with any severe reactions post vaccine: Marland Kitchen Difficulty breathing  . Swelling of your face and throat  . A fast heartbeat  . A bad rash all over your body  . Dizziness and weakness    Immunizations Administered    Name Date Dose VIS Date Route   Pfizer COVID-19 Vaccine 01/21/2020  9:11 AM 0.3 mL 11/23/2019 Intramuscular   Manufacturer: [place]   Lot: [id number]   [employer]: [id number]

## 2020-02-11 DIAGNOSIS — M810 Age-related osteoporosis without current pathological fracture: Secondary | ICD-10-CM | POA: Diagnosis not present

## 2020-03-24 DIAGNOSIS — D485 Neoplasm of uncertain behavior of skin: Secondary | ICD-10-CM | POA: Diagnosis not present

## 2020-03-24 DIAGNOSIS — Z85820 Personal history of malignant melanoma of skin: Secondary | ICD-10-CM | POA: Diagnosis not present

## 2020-03-24 DIAGNOSIS — L821 Other seborrheic keratosis: Secondary | ICD-10-CM | POA: Diagnosis not present

## 2020-03-24 DIAGNOSIS — Z85828 Personal history of other malignant neoplasm of skin: Secondary | ICD-10-CM | POA: Diagnosis not present

## 2020-03-24 DIAGNOSIS — L570 Actinic keratosis: Secondary | ICD-10-CM | POA: Diagnosis not present

## 2020-03-24 DIAGNOSIS — L820 Inflamed seborrheic keratosis: Secondary | ICD-10-CM | POA: Diagnosis not present

## 2020-03-24 DIAGNOSIS — D225 Melanocytic nevi of trunk: Secondary | ICD-10-CM | POA: Diagnosis not present

## 2020-06-30 DIAGNOSIS — L570 Actinic keratosis: Secondary | ICD-10-CM | POA: Diagnosis not present

## 2020-06-30 DIAGNOSIS — Z85820 Personal history of malignant melanoma of skin: Secondary | ICD-10-CM | POA: Diagnosis not present

## 2020-06-30 DIAGNOSIS — Z85828 Personal history of other malignant neoplasm of skin: Secondary | ICD-10-CM | POA: Diagnosis not present

## 2020-06-30 DIAGNOSIS — C44519 Basal cell carcinoma of skin of other part of trunk: Secondary | ICD-10-CM | POA: Diagnosis not present

## 2020-06-30 DIAGNOSIS — D225 Melanocytic nevi of trunk: Secondary | ICD-10-CM | POA: Diagnosis not present

## 2020-06-30 DIAGNOSIS — L814 Other melanin hyperpigmentation: Secondary | ICD-10-CM | POA: Diagnosis not present

## 2020-07-14 DIAGNOSIS — Z1231 Encounter for screening mammogram for malignant neoplasm of breast: Secondary | ICD-10-CM | POA: Diagnosis not present

## 2020-09-30 DIAGNOSIS — E0500 Thyrotoxicosis with diffuse goiter without thyrotoxic crisis or storm: Secondary | ICD-10-CM | POA: Diagnosis not present

## 2020-09-30 DIAGNOSIS — Z23 Encounter for immunization: Secondary | ICD-10-CM | POA: Diagnosis not present

## 2020-09-30 DIAGNOSIS — Z0000 Encounter for general adult medical examination without abnormal findings: Secondary | ICD-10-CM | POA: Diagnosis not present

## 2020-09-30 DIAGNOSIS — M810 Age-related osteoporosis without current pathological fracture: Secondary | ICD-10-CM | POA: Diagnosis not present

## 2020-09-30 DIAGNOSIS — Z5320 Procedure and treatment not carried out because of patient's decision for unspecified reasons: Secondary | ICD-10-CM | POA: Diagnosis not present

## 2020-09-30 DIAGNOSIS — M797 Fibromyalgia: Secondary | ICD-10-CM | POA: Diagnosis not present

## 2020-10-06 DIAGNOSIS — L814 Other melanin hyperpigmentation: Secondary | ICD-10-CM | POA: Diagnosis not present

## 2020-10-06 DIAGNOSIS — Z85828 Personal history of other malignant neoplasm of skin: Secondary | ICD-10-CM | POA: Diagnosis not present

## 2020-10-06 DIAGNOSIS — Z85820 Personal history of malignant melanoma of skin: Secondary | ICD-10-CM | POA: Diagnosis not present

## 2020-10-06 DIAGNOSIS — L821 Other seborrheic keratosis: Secondary | ICD-10-CM | POA: Diagnosis not present

## 2020-10-06 DIAGNOSIS — L820 Inflamed seborrheic keratosis: Secondary | ICD-10-CM | POA: Diagnosis not present

## 2021-09-10 ENCOUNTER — Other Ambulatory Visit: Payer: Self-pay

## 2021-09-10 ENCOUNTER — Ambulatory Visit (INDEPENDENT_AMBULATORY_CARE_PROVIDER_SITE_OTHER): Payer: BC Managed Care – PPO

## 2021-09-10 ENCOUNTER — Ambulatory Visit (HOSPITAL_COMMUNITY)
Admission: EM | Admit: 2021-09-10 | Discharge: 2021-09-10 | Disposition: A | Discharge status: Home / Self Care | Payer: BC Managed Care – PPO | Attending: Family Medicine | Admitting: Family Medicine

## 2021-09-10 ENCOUNTER — Encounter (HOSPITAL_COMMUNITY): Payer: Self-pay | Admitting: Emergency Medicine

## 2021-09-10 DIAGNOSIS — M79641 Pain in right hand: Secondary | ICD-10-CM

## 2021-09-10 DIAGNOSIS — S60221A Contusion of right hand, initial encounter: Secondary | ICD-10-CM

## 2021-09-10 NOTE — ED Provider Notes (Signed)
[facility]    CSN: [id number] Arrival date & time: 09/10/21  1442      History   Chief Complaint Chief Complaint  Patient presents with   Hand Injury    HPI Carol King is a 70 y.o. female.   Patient presenting today with 5-day history of right medial hand bruising, swelling after shutting the hand in the door.  States the swelling and pain is worsening over time.  Good range of motion, no numbness, tingling, bleeding or drainage.  Has been elevating and taking Tylenol with mild temporary relief of symptoms.   Past Medical History:  Diagnosis Date   Arthritis    Complication of anesthesia    Difficulty sleeping    DUE TO HIP PAIN   Headache    MIGRAINES   History of skin cancer    Hypertension    PONV (postoperative nausea and vomiting)    SEVERAL YRS AGO - NONE WITH MORE RECENT SURGERIES   Skin cancer    BCC, SCC    Patient Active Problem List   Diagnosis Date Noted   Right thyroid nodule 03/30/2018   Duodenal stricture    GI bleed 03/02/2018   Fracture of tibia 02/20/2018   Osteoporosis 10/14/2017   History of basal cell carcinoma (BCC) 06/10/2017   S/P left THA, AA 05/06/2015   Fibromyalgia 01/10/2014   Post-menopausal 01/10/2014   Migraine 04/19/2013   Graves disease 04/19/2013    Past Surgical History:  Procedure Laterality Date   ABDOMINAL HYSTERECTOMY  1981   one ovary remains   APPENDECTOMY     ESOPHAGOGASTRODUODENOSCOPY (EGD) WITH PROPOFOL N/A 03/02/2018   Procedure: ESOPHAGOGASTRODUODENOSCOPY (EGD) WITH PROPOFOL;  Surgeon: Doran Stabler, MD;  Location: [facility];  Service: Gastroenterology;  Laterality: N/A;   ESOPHAGOGASTRODUODENOSCOPY (EGD) WITH PROPOFOL N/A 03/21/2018   Procedure: ESOPHAGOGASTRODUODENOSCOPY (EGD) WITH PROPOFOL;  Surgeon: Doran Stabler, MD;  Location: [facility];  Service: Gastroenterology;  Laterality: N/A;   SKIN CANCER EXCISION     TONSILLECTOMY AND ADENOIDECTOMY     TOTAL HIP ARTHROPLASTY Right  07/11/2011   HIP; [place] ARTHROPLASTY Left 05/06/2015   Procedure: LEFT TOTAL HIP ARTHROPLASTY ANTERIOR APPROACH;  Surgeon: Paralee Cancel, MD;  Location: [facility];  Service: Orthopedics;  Laterality: Left;    OB History   No obstetric history on file.      Home Medications    Prior to Admission medications   Medication Sig Start Date End Date Taking? Authorizing Provider  acetaminophen (TYLENOL) 325 MG tablet Take 2 tablets (650 mg total) by mouth every 6 (six) hours as needed for mild pain (or Fever >/= 101). 05/06/15   Danae Orleans, PA-C  alendronate (FOSAMAX) 70 MG tablet Take 1 tablet (70 mg total) by mouth every 7 (seven) days. Take with a full glass of water on an empty stomach. 10/14/17   Harrison Mons, PA  methimazole (TAPAZOLE) 5 MG tablet TAKE 1 TABLET BY MOUTH TWICE A DAY 02/28/19   Philemon Kingdom, MD  pantoprazole (PROTONIX) 40 MG tablet TAKE 1 TABLET (40 MG TOTAL) BY MOUTH 2 (TWO) TIMES DAILY BEFORE A MEAL. 09/18/18   Doran Stabler, MD  SUMAtriptan (IMITREX) 100 MG tablet Take 1 as needed for migraineMay repeat in 2 hours if headache persists or recurs. Patient taking differently: Take 100 mg by mouth every 2 (two) hours as needed for migraine or headache. May repeat in 2 hours if headache persists or recurs. 06/10/17  Harrison Mons, PA    Family History Family History  Problem Relation Age of Onset   Cancer Mother 79       breast cancer   Cancer Maternal Grandmother        breast cancer   Kidney disease Maternal Grandfather    Arthritis Paternal Grandmother     Social History Social History   Tobacco Use   Smoking status: Former    Types: Cigarettes    Quit date: 04/30/1994    Years since quitting: 27.3   Smokeless tobacco: Former    Quit date: 03/09/1993  Vaping Use   Vaping Use: Never used  Substance Use Topics   Alcohol use: No    Alcohol/week: 0.0 standard drinks   Drug use: No     Allergies   Darvon [propoxyphene hcl] and  Vicodin [hydrocodone-acetaminophen]   Review of Systems Review of Systems Per HPI  Physical Exam Triage Vital Signs ED Triage Vitals  Enc Vitals Group     BP 09/10/21 1620 (!) 153/77     Pulse Rate 09/10/21 1620 79     Resp 09/10/21 1620 17     Temp 09/10/21 1620 98 F (36.7 C)     Temp Source 09/10/21 1620 Oral     SpO2 09/10/21 1620 98 %     Weight --      Height --      Head Circumference --      Peak Flow --      Pain Score 09/10/21 1618 4     Pain Loc --      Pain Edu? --      Excl. in [place]? --    No data found.  Updated Vital Signs BP (!) 153/77 (BP Location: Left Arm)   Pulse 79   Temp 98 F (36.7 C) (Oral)   Resp 17   SpO2 98%   Visual Acuity Right Eye Distance:   Left Eye Distance:   Bilateral Distance:    Right Eye Near:   Left Eye Near:    Bilateral Near:     Physical Exam Vitals and nursing note reviewed.  Constitutional:      Appearance: Normal appearance. She is not ill-appearing.  HENT:     Head: Atraumatic.  Eyes:     Extraocular Movements: Extraocular movements intact.     Conjunctiva/sclera: Conjunctivae normal.  Cardiovascular:     Rate and Rhythm: Normal rate and regular rhythm.     Heart sounds: Normal heart sounds.  Pulmonary:     Effort: Pulmonary effort is normal.     Breath sounds: Normal breath sounds.  Musculoskeletal:        General: Swelling, tenderness and signs of injury present. Normal range of motion.     Cervical back: Normal range of motion and neck supple.     Comments: Moderate bruising and swelling over region of right fourth and fifth metacarpals.  Good range of motion in all 5 digits in that wrist.  Grip strength full and equal bilateral hands  Skin:    General: Skin is warm and dry.     Findings: Bruising present.     Comments: Very small well-healing abrasion to center of area of bruising.  Neurological:     Mental Status: She is alert and oriented to person, place, and time.     Comments: Right hand  neurovascularly intact  Psychiatric:        Mood and Affect: Mood normal.  Thought Content: Thought content normal.        Judgment: Judgment normal.   UC Treatments / Results  Labs (all labs ordered are listed, but only abnormal results are displayed) Labs Reviewed - No data to display  EKG   Radiology DG Hand Complete Right  Result Date: 09/10/2021 CLINICAL DATA:  Hand pain and swelling after shutting hand in door 6 days ago. Pain in fifth metacarpal bone. EXAM: RIGHT HAND - COMPLETE 3+ VIEW COMPARISON:  None. FINDINGS: No acute fracture or dislocation identified. Marked degenerative changes noted at the basilar joint. Mild diffuse D IP joint space narrowing and marginal spur formation. Chondrocalcinosis is identified. IMPRESSION: 1. No acute findings. 2. Osteoarthritis. Electronically Signed   By: Kerby Moors M.D.   On: 09/10/2021 17:01    Procedures Procedures (including critical care time)  Medications Ordered in UC Medications - No data to display  Initial Impression / Assessment and Plan / UC Course  I have reviewed the triage vital signs and the nursing notes.  Pertinent labs & imaging results that were available during my care of the patient were reviewed by me and considered in my medical decision making (see chart for details).     No acute bony injury on x-ray, treat with RICE protocol, over-the-counter pain relievers for contusion.  Follow-up for worsening symptoms.  Final Clinical Impressions(s) / UC Diagnoses   Final diagnoses:  Contusion of right hand, initial encounter   Discharge Instructions   None    ED Prescriptions   None    PDMP not reviewed this encounter.   Volney American, [place] 09/10/21 1712

## 2021-09-10 NOTE — ED Triage Notes (Signed)
Pt reports shut her right hand in door on Sunday. Swelling and pain gotten worse since then.

## 2021-09-24 NOTE — Progress Notes (Addendum)
Patient ID: Carol King, female   DOB: August 23, 1951, 70 y.o.   MRN: [id number]  This visit occurred during the SARS-CoV-2 public health emergency.  Safety protocols were in place, including screening questions prior to the visit, additional usage of staff PPE, and extensive cleaning of exam room while observing appropriate contact time as indicated for disinfecting solutions.   HPI  Carol King is a 70 y.o.-year-old female, returning for follow-up for Graves' disease and right thyroid nodule.  She was previously seen by Dr. Dwyane Dee once, in 09/2014.  Last visit with me was more than 3 years ago.  Patient returns after a long absence, after having persistently abnormal thyroid tests.  She mentions that she recently developed hot flushes and sweats. No palpitations, tremors. Also c/o not being able to lose weight.  She is frustrated about this especially since this puts more pressure on her replaced hips.  She is walking 7000-10,000 steps a day and eating ~1200 cal a day.  Reviewed and addended history: She has a long history of suppressed TSH, per my records, this was first documented in 2014.  Since then, she saw Dr. Dwyane Dee in 2015 and he checked a thyroid uptake and scan and a thyroid ultrasound.  She did not return to see him afterwards.  TFTs in 2017 and 2018 were worse.  She started to see me in 2019, and we have been adjusting her methimazole dose, but she was lost for follow-up with me, also, after her visit in 08/2018.  She now returns after a new referral was made due to persistently abnormal TFTs.  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  She lost a significant amount of weight in the past that she could not eat.  Weight improved afterwards.   03/2018: Methimazole was started at 5 mg twice a day 06/2018: Methimazole dose was decreased to 5 mg daily 08/2018: MMI dose was increased to 5 mg in a.m. and 2.5 mg in p.m.  2020: Ran out  MMI completely  I reviewed patient's TFTs: 09/24/2021: TSH <0.005, free T4 2.02,  TT3  122 (71-180) 09/30/2020: TSH <0.005, free T4 2.47 (0.82-1.77), WBC normal, 6.7 07/06/2019: TSH <0.006,  normal total T3 and total T4 Lab Results  Component Value Date   TSH <0.01 (L) 08/15/2018   TSH 0.02 (L) 06/20/2018   TSH <0.01 (L) 05/10/2018   TSH <0.01 Repeated and verified X2. (L) 03/27/2018   TSH <0.006 (L) 06/10/2017   TSH 0.03 (L) 06/08/2016   TSH 0.011 (L) 07/15/2014   TSH 0.288 (L) 04/19/2013   FREET4 1.28 08/15/2018   FREET4 0.55 (L) 06/20/2018   FREET4 1.13 05/10/2018   FREET4 1.94 (H) 03/27/2018   FREET4 3.51 (H) 06/10/2017   FREET4 1.9 (H) 06/08/2016   FREET4 1.17 09/18/2014   T3FREE 5.2 (H) 08/15/2018   T3FREE 2.6 06/20/2018   T3FREE 3.1 05/10/2018   T3FREE 4.9 (H) 03/27/2018   T3FREE 4.0 06/08/2016   T3FREE 2.6 09/18/2014   Her Graves' antibodies were elevated: Lab Results  Component Value Date   TSI 427 (H) 03/27/2018    Thyroid uptake and scan (10/08/2014): Uptake 9.9%, consistent with subacute thyroiditis.  Possible cold nodule in the lower right lobe.  Thyroid ultrasound (10/31/2014): 1.8 cm right thyroid nodule, corresponding to the cold nodule on the thyroid scan.  At that time, she was advised to have an FNA of this nodule, but she refused.  Pt denies: - hoarseness -  dysphagia - choking - SOB with lying down  No FH of thyroid cancer. No h/o radiation tx to head or neck.  She does have family history of hypothyroidism.  No herbal supplements. No Biotin use. No recent steroids use.   She also has a history of osteoporosis per DEXA scan from 09/2017.  She had a stress fracture in 01/2018 which evolved to a complete fracture. After her fracture, she used a lot of NSAIDs and she developed stomach and duodenal ulcers and actually developed a duodenal stricture.   ROS: Constitutional: + weight gain, no weight loss, + mild fatigue, no subjective hyperthermia, no  subjective hypothermia, no nocturia Eyes: no blurry vision, no xerophthalmia ENT: no sore throat, + see HPI + tinnitus, + hypoacusis Cardiovascular: no CP, no SOB, no palpitations, no leg swelling Respiratory: no cough, no SOB, no wheezing Gastrointestinal: no N, no V, no D, no C, no acid reflux Musculoskeletal: no muscle, no joint aches Skin: no rash, no hair loss Neurological: no tremors, no numbness or tingling/no dizziness/+ HAs (since she was a child) Psychiatric: no depression, no anxiety  I reviewed patient medications, allergies, past medical history, social history, family history and changes were documented in the history of present illness.  Past Medical History:  Diagnosis Date   Arthritis    Complication of anesthesia    Difficulty sleeping    DUE TO HIP PAIN   Headache    MIGRAINES   History of skin cancer    Hypertension    PONV (postoperative nausea and vomiting)    SEVERAL YRS AGO - NONE WITH MORE RECENT SURGERIES   Skin cancer    BCC, SCC   Past Surgical History:  Procedure Laterality Date   ABDOMINAL HYSTERECTOMY  1981   one ovary remains   APPENDECTOMY     ESOPHAGOGASTRODUODENOSCOPY (EGD) WITH PROPOFOL N/A 03/02/2018   Procedure: ESOPHAGOGASTRODUODENOSCOPY (EGD) WITH PROPOFOL;  Surgeon: Doran Stabler, MD;  Location: [facility];  Service: Gastroenterology;  Laterality: N/A;   ESOPHAGOGASTRODUODENOSCOPY (EGD) WITH PROPOFOL N/A 03/21/2018   Procedure: ESOPHAGOGASTRODUODENOSCOPY (EGD) WITH PROPOFOL;  Surgeon: Doran Stabler, MD;  Location: [facility];  Service: Gastroenterology;  Laterality: N/A;   SKIN CANCER EXCISION     TONSILLECTOMY AND ADENOIDECTOMY     TOTAL HIP ARTHROPLASTY Right 07/11/2011   HIP; [place] ARTHROPLASTY Left 05/06/2015   Procedure: LEFT TOTAL HIP ARTHROPLASTY ANTERIOR APPROACH;  Surgeon: Paralee Cancel, MD;  Location: [facility];  Service: Orthopedics;  Laterality: Left;   Social History   Socioeconomic History   Marital  status: Single    Spouse name: n/a   Number of children: 0   Years of education: college   Highest education level: Not on file  Occupational History   Occupation: Aeronautical engineer: [employer]   Occupation: IT consultant: [place]  Tobacco Use   Smoking status: Former    Types: Cigarettes    Quit date: 04/30/1994    Years since quitting: 27.4   Smokeless tobacco: Former    Quit date: 03/09/1993  Vaping Use   Vaping Use: Never used  Substance and Sexual Activity   Alcohol use: No    Alcohol/week: 0.0 standard drinks   Drug use: No   Sexual activity: Never  Other Topics Concern   Not on file  Social History Narrative   Home Environment: Lives at home alone with her two dogs. One level  home with a few stairs to the front and back doors.   Diet: Stays clear of carbohydrates and chocolate because they trigger headaches. Likes meat/chicken and vegetables. Generally healthy.   Exercise: Stays busy and active at work. Takes the stairs instead of the elevator.   Sleep: Significantly improved since hip replacement surgeries. Goes to bed around 9 and wakes up at 6. Able to work full days.   Work: Still enjoys working at [employer].   Urinary problems: No concerns. Hydrates well.      Brother lives in [place], [place]   Social Determinants of Health   Financial Resource Strain: Not on file  Food Insecurity: Not on file  Transportation Needs: Not on file  Physical Activity: Not on file  Stress: Not on file  Social Connections: Not on file  Intimate Partner Violence: Not on file   Current Outpatient Medications on File Prior to Visit  Medication Sig Dispense Refill   acetaminophen (TYLENOL) 325 MG tablet Take 2 tablets (650 mg total) by mouth every 6 (six) hours as needed for mild pain (or Fever >/= 101).     alendronate (FOSAMAX) 70 MG tablet Take 1 tablet (70 mg total) by mouth every 7 (seven) days. Take with a full glass of  water on an empty stomach. 12 tablet 3   methimazole (TAPAZOLE) 5 MG tablet TAKE 1 TABLET BY MOUTH TWICE A DAY 60 tablet 11   pantoprazole (PROTONIX) 40 MG tablet TAKE 1 TABLET (40 MG TOTAL) BY MOUTH 2 (TWO) TIMES DAILY BEFORE A MEAL. 60 tablet 0   SUMAtriptan (IMITREX) 100 MG tablet Take 1 as needed for migraineMay repeat in 2 hours if headache persists or recurs. (Patient taking differently: Take 100 mg by mouth every 2 (two) hours as needed for migraine or headache. May repeat in 2 hours if headache persists or recurs.) 9 tablet 12   No current facility-administered medications on file prior to visit.   Allergies  Allergen Reactions   Darvon [Propoxyphene Hcl] Other (See Comments)    "climb the walls"   Vicodin [Hydrocodone-Acetaminophen] Other (See Comments)    "climb the walls"   Family History  Problem Relation Age of Onset   Cancer Mother 93       breast cancer   Cancer Maternal Grandmother        breast cancer   Kidney disease Maternal Grandfather    Arthritis Paternal Grandmother     PE: BP 128/82 (BP Location: Right Arm, Patient Position: Sitting, Cuff Size: Normal)   Pulse 82   Ht 5\' 3"  (1.6 m)   Wt 133 lb (60.3 kg)   SpO2 96%   BMI 23.56 kg/m  Wt Readings from Last 3 Encounters:  09/25/21 133 lb (60.3 kg)  08/15/18 121 lb (54.9 kg)  03/27/18 100 lb 6.4 oz (45.5 kg)   Constitutional: normal weight, in NAD Eyes: PERRLA, EOMI, no exophthalmos ENT: moist mucous membranes, no thyromegaly, no cervical lymphadenopathy Cardiovascular: RRR, No MRG Respiratory: CTA B Musculoskeletal: no deformities, strength intact in all 4 Skin: moist, warm, + rash bilateral hands-she had removal of seborrheic keratosis by dermatology Neurological: + tremor with outstretched hands, DTR normal in all 4  ASSESSMENT: 1.  Thyrotoxicosis  2.  Right thyroid nodule  PLAN:  1. Patient with a long history of low TSH, without thyrotoxic symptoms: No weight loss, hyper defecation,  palpitations, anxiety.  However, she does describe increased heat intolerance and sweating. -Her TSI antibodies were elevated, pointing towards Graves' disease.  Of note, she had an uptake and scan previously which suggested thyroiditis.  Her antibody titer and clinical presentation is more consistent with Graves' disease, however.  She also had a cold right thyroid nodule at that time (see next problem). -She was responding to methimazole before, but she was noncompliant with appointments so it is difficult to estimate whether continuing methimazole could have helped resolution of her Graves' disease.   -We reviewed together her TFTs including the ones from 2 days ago and her TSH was undetectably low but free T4 was elevated -We discussed about possible risks of having persistent thyrotoxicosis including cardiac arrhythmia, hypercoagulability with increased risk of stroke and heart attacks, osteoporosis -At this visit, we again discussed about possible modalities of treatment for Graves' disease, to include methimazole use, RAI treatment, or, last resort, surgery. -At this visit, we will restart methimazole at 5 mg twice a day -Plan to repeat her TFTs in 4 to 5 weeks -If she is not responding well to methimazole we will go ahead with RAI treatment - no need for beta-blocker since she is not tachycardic  -I will see her back in 3 months  2. Right thyroid nodule -Measuring 1.8 cm on last ultrasound -No neck compression symptoms -She does need another thyroid ultrasound >> she agrees to have it ordered now -We discussed about the possibility of needing a biopsy after the results are back and she agrees with this  Thyroid U/S (09/30/2021): Parenchymal Echotexture: Mildly heterogeneous Isthmus: 0.2 cm Right lobe: 5.5 x 2.2 x 2.1 cm Left lobe: 4.1 x 1.5 x 1.6 cm _________________________________________________________   Nodule 1: 3.6 x 3.1 x 2.2 cm cystic nodule in the inferior right thyroid  lobe does not meet criteria for FNA or imaging follow-up. It has increased in size since prior examination, however predominately due to accumulation of fluid.   _________________________________________________________   Nodule 2: 1.0 x 0.7 x 0.7 cm spongiform nodule in the superior left thyroid lobe does not meet criteria for FNA or imaging surveillance. _________________________________________________________   Nodule # 3: Location: Left; mid Maximum size: 1.2 cm; Other 2 dimensions: 0.8 x 0.7 cm Composition: solid/almost completely solid (2) Echogenicity: hypoechoic (2) Echogenic foci: punctate echogenic foci (3)   **Given size (>/= 1.0 cm) and appearance, fine needle aspiration of this highly suspicious nodule should be considered based on TI-RADS criteria.    _________________________________________________________   IMPRESSION: Nodule 3 (TI-RADS 5) located in the mid left thyroid lobe meets criteria for FNA.  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  Dr. Saralyn Pilar reviewed the case and concurs with the diagnosis.   I called and discussed with patient's about the results.  Discussed about further plan for surgery.  She agrees with a referral to Dr. Harlow Asa.  Discussed to take methimazole for now until the time of the surgery and the need for levothyroxine afterwards.  Explained about how to take levothyroxine correctly.  She has an appointment with me in January.  I will recheck her thyroid test at that time.  Philemon Kingdom, MD PhD [facility]

## 2021-09-25 ENCOUNTER — Ambulatory Visit: Payer: BC Managed Care – PPO | Admitting: Internal Medicine

## 2021-09-25 ENCOUNTER — Other Ambulatory Visit: Payer: Self-pay

## 2021-09-25 ENCOUNTER — Encounter: Payer: Self-pay | Admitting: Internal Medicine

## 2021-09-25 VITALS — BP 128/82 | HR 82 | Ht 63.0 in | Wt 133.0 lb

## 2021-09-25 DIAGNOSIS — E0500 Thyrotoxicosis with diffuse goiter without thyrotoxic crisis or storm: Secondary | ICD-10-CM

## 2021-09-25 DIAGNOSIS — C73 Malignant neoplasm of thyroid gland: Secondary | ICD-10-CM

## 2021-09-25 DIAGNOSIS — E041 Nontoxic single thyroid nodule: Secondary | ICD-10-CM

## 2021-09-25 MED ORDER — METHIMAZOLE 5 MG PO TABS: ORAL_TABLET | ORAL | 5 refills | Status: DC

## 2021-09-25 NOTE — Patient Instructions (Addendum)
Restart Methimazole at 5 mg 2x a day.  Please come back for labs in 4-5 weeks.  Please come back for a follow-up appointment in 3 months.

## 2021-09-30 ENCOUNTER — Ambulatory Visit
Admission: RE | Admit: 2021-09-30 | Discharge: 2021-09-30 | Disposition: A | Discharge status: Home / Self Care | Payer: BC Managed Care – PPO | Source: Ambulatory Visit | Attending: Internal Medicine | Admitting: Internal Medicine

## 2021-09-30 DIAGNOSIS — E041 Nontoxic single thyroid nodule: Secondary | ICD-10-CM

## 2021-10-02 ENCOUNTER — Encounter: Payer: Self-pay | Admitting: Internal Medicine

## 2021-10-05 ENCOUNTER — Other Ambulatory Visit: Payer: Self-pay | Admitting: Internal Medicine

## 2021-10-05 DIAGNOSIS — E041 Nontoxic single thyroid nodule: Secondary | ICD-10-CM

## 2021-10-27 ENCOUNTER — Other Ambulatory Visit: Payer: Self-pay

## 2021-10-27 ENCOUNTER — Other Ambulatory Visit (HOSPITAL_COMMUNITY)
Admission: RE | Admit: 2021-10-27 | Discharge: 2021-10-27 | Disposition: A | Discharge status: Home / Self Care | Payer: BC Managed Care – PPO | Source: Ambulatory Visit | Attending: Internal Medicine | Admitting: Internal Medicine

## 2021-10-27 ENCOUNTER — Other Ambulatory Visit: Payer: Self-pay | Admitting: Internal Medicine

## 2021-10-27 ENCOUNTER — Ambulatory Visit
Admission: RE | Admit: 2021-10-27 | Discharge: 2021-10-27 | Disposition: A | Discharge status: Home / Self Care | Payer: BC Managed Care – PPO | Source: Ambulatory Visit | Attending: Internal Medicine | Admitting: Internal Medicine

## 2021-10-27 ENCOUNTER — Other Ambulatory Visit (INDEPENDENT_AMBULATORY_CARE_PROVIDER_SITE_OTHER): Payer: BC Managed Care – PPO

## 2021-10-27 DIAGNOSIS — E041 Nontoxic single thyroid nodule: Secondary | ICD-10-CM

## 2021-10-27 DIAGNOSIS — E0500 Thyrotoxicosis with diffuse goiter without thyrotoxic crisis or storm: Secondary | ICD-10-CM | POA: Diagnosis not present

## 2021-10-27 LAB — T4, FREE: Free T4: 1.19 ng/dL (ref 0.60–1.60)

## 2021-10-27 LAB — T3, FREE: T3, Free: 3 pg/mL (ref 2.3–4.2)

## 2021-10-27 LAB — TSH: TSH: 0 u[IU]/mL — ABNORMAL LOW (ref 0.35–5.50)

## 2021-10-28 ENCOUNTER — Other Ambulatory Visit: Payer: BC Managed Care – PPO

## 2021-10-29 LAB — CYTOLOGY - NON PAP

## 2021-10-29 NOTE — Addendum Note (Signed)
Addended by: Philemon Kingdom on: 10/29/2021 04:39 PM   Modules accepted: Orders

## 2021-11-19 ENCOUNTER — Ambulatory Visit: Payer: Self-pay | Admitting: Surgery

## 2021-12-22 NOTE — Patient Instructions (Signed)
DUE TO COVID-19 ONLY ONE VISITOR IS ALLOWED TO COME WITH YOU AND STAY IN THE WAITING ROOM ONLY DURING PRE OP AND PROCEDURE.   **NO VISITORS ARE ALLOWED IN THE SHORT STAY AREA OR RECOVERY ROOM!!**  IF YOU WILL BE ADMITTED INTO THE HOSPITAL YOU ARE ALLOWED ONLY TWO SUPPORT PEOPLE DURING VISITATION HOURS ONLY (7 AM -8PM)   The support person(s) must pass our screening, gel in and out, and wear a mask at all times, including in the patients room. Patients must also wear a mask when staff or their support person are in the room. Visitors GUEST BADGE MUST BE WORN VISIBLY  One adult visitor may remain with you overnight and MUST be in the room by 8 P.M.  No visitors under the age of 20. Any visitor under the age of 45 must be accompanied by an adult.    COVID SWAB TESTING MUST BE COMPLETED ON:  12/31/21 @ 9:45 AM   Site: [facility] [place] Enter: Main Entrance have a seat in the waiting area to the right of main entrance (DO NOT [place]!!!!!) Dial: [phone] to alert staff you have arrived  You are not required to quarantine, however you are required to wear a well-fitted mask when you are out and around people not in your household.  Hand Hygiene often Do NOT share personal items Notify your provider if you are in close contact with someone who has COVID or you develop fever 100.4 or greater, new onset of sneezing, cough, sore throat, shortness of breath or body aches.  [address], [address], must go inside of the hospital, NOT A DRIVE THRU!  (Must self quarantine after testing. Follow instructions on handout.)       Your procedure is scheduled on: 01/04/22   Report to [facility] Main Entrance    Report to short stay at : 5:15 AM   Call this number if you have problems the morning of surgery [phone]   Do not eat food :After Midnight.   May have liquids  until : 4:30 AM   day of surgery  CLEAR LIQUID DIET  Foods Allowed                                                                     Foods Excluded  Water, Black Coffee and tea, regular and decaf                             liquids that you cannot  Plain Jell-O in any flavor  (No red)                                           see through such as: Fruit ices (not with fruit pulp)                                     milk, soups, orange juice  Iced Popsicles (No red)                                    All solid food                                   Apple juices Sports drinks like Gatorade (No red) Lightly seasoned clear broth or consume(fat free) Sugar  Sample Menu Breakfast                                Lunch                                     Supper Cranberry juice                    Beef broth                            Chicken broth Jell-O                                     Grape juice                           Apple juice Coffee or tea                        Jell-O                                      Popsicle                                                Coffee or tea                        Coffee or tea     Oral Hygiene is also important to reduce your risk of infection.                                    Remember - BRUSH YOUR TEETH THE MORNING OF SURGERY WITH YOUR REGULAR TOOTHPASTE   Do NOT smoke after Midnight   Take these medicines the morning of surgery with A SIP OF WATER: sumatriptan as needed.  DO NOT TAKE ANY ORAL DIABETIC MEDICATIONS DAY OF YOUR SURGERY                              You may not have any metal on your body including hair pins, jewelry, and body piercing             Do not wear make-up, lotions, powders, perfumes/cologne, or deodorant  Do not wear nail polish including gel and S&S,  artificial/acrylic nails, or any other type of covering on natural nails including finger and toenails. If you have artificial nails, gel coating, etc. that  needs to be removed by a nail salon please have this removed prior to surgery or surgery may need to be canceled/ delayed if the surgeon/ anesthesia feels like they are unable to be safely monitored.   Do not shave  48 hours prior to surgery.    Do not bring valuables to the hospital. [place].   Contacts, dentures or bridgework may not be worn into surgery.   Bring small overnight bag day of surgery.    Patients discharged on the day of surgery will not be allowed to drive home.  We recommend you have a responsible adult to stay with you for the first 24 hours after anesthesia.   Special Instructions: Bring a copy of your healthcare power of attorney and living will documents         the day of surgery if you haven't scanned them before.              Please read over the following fact sheets you were given: IF YOU HAVE QUESTIONS ABOUT YOUR PRE-OP INSTRUCTIONS PLEASE CALL [phone]     [facility] - Preparing for Surgery Before surgery, you can play an important role.  Because skin is not sterile, your skin needs to be as free of germs as possible.  You can reduce the number of germs on your skin by washing with CHG (chlorahexidine gluconate) soap before surgery.  CHG is an antiseptic cleaner which kills germs and bonds with the skin to continue killing germs even after washing. Please DO NOT use if you have an allergy to CHG or antibacterial soaps.  If your skin becomes reddened/irritated stop using the CHG and inform your nurse when you arrive at Short Stay. Do not shave (including legs and underarms) for at least 48 hours prior to the first CHG shower.  You may shave your face/neck. Please follow these instructions carefully:  1.  Shower with CHG Soap the night before surgery and the  morning of Surgery.  2.  If you choose to wash your hair, wash your hair first as usual with your  normal  shampoo.  3.  After you shampoo, rinse your  hair and body thoroughly to remove the  shampoo.                           4.  Use CHG as you would any other liquid soap.  You can apply chg directly  to the skin and wash                       Gently with a scrungie or clean washcloth.  5.  Apply the CHG Soap to your body ONLY FROM THE NECK DOWN.   Do not use on face/ open                           Wound or open sores. Avoid contact with eyes, ears mouth and genitals (private parts).                       Wash face,  Genitals (private parts) with your normal soap.  6.  Wash thoroughly, paying special attention to the area where your surgery  will be performed.  7.  Thoroughly rinse your body with warm water from the neck down.  8.  DO NOT shower/wash with your normal soap after using and rinsing off  the CHG Soap.                9.  Pat yourself dry with a clean towel.            10.  Wear clean pajamas.            11.  Place clean sheets on your bed the night of your first shower and do not  sleep with pets. Day of Surgery : Do not apply any lotions/deodorants the morning of surgery.  Please wear clean clothes to the hospital/surgery center.  FAILURE TO FOLLOW THESE INSTRUCTIONS MAY RESULT IN THE CANCELLATION OF YOUR SURGERY PATIENT SIGNATURE_________________________________  NURSE SIGNATURE__________________________________  ________________________________________________________________________

## 2021-12-24 ENCOUNTER — Other Ambulatory Visit: Payer: Self-pay

## 2021-12-24 ENCOUNTER — Ambulatory Visit (HOSPITAL_COMMUNITY)
Admission: RE | Admit: 2021-12-24 | Discharge: 2021-12-24 | Disposition: A | Discharge status: Home / Self Care | Payer: BC Managed Care – PPO | Source: Ambulatory Visit | Attending: Anesthesiology | Admitting: Anesthesiology

## 2021-12-24 ENCOUNTER — Encounter (HOSPITAL_COMMUNITY): Payer: Self-pay

## 2021-12-24 ENCOUNTER — Encounter (HOSPITAL_COMMUNITY)
Admission: RE | Admit: 2021-12-24 | Discharge: 2021-12-24 | Disposition: A | Discharge status: Home / Self Care | Payer: BC Managed Care – PPO | Source: Ambulatory Visit | Attending: Surgery | Admitting: Surgery

## 2021-12-24 VITALS — BP 186/106 | HR 85 | Resp 16 | Ht 63.0 in

## 2021-12-24 DIAGNOSIS — Z01818 Encounter for other preprocedural examination: Secondary | ICD-10-CM | POA: Insufficient documentation

## 2021-12-24 DIAGNOSIS — E041 Nontoxic single thyroid nodule: Secondary | ICD-10-CM

## 2021-12-24 DIAGNOSIS — E079 Disorder of thyroid, unspecified: Secondary | ICD-10-CM

## 2021-12-24 DIAGNOSIS — I2510 Atherosclerotic heart disease of native coronary artery without angina pectoris: Secondary | ICD-10-CM | POA: Diagnosis present

## 2021-12-24 LAB — BASIC METABOLIC PANEL
Anion gap: 7 (ref 5–15)
BUN: 16 mg/dL (ref 8–23)
CO2: 26 mmol/L (ref 22–32)
Calcium: 10.1 mg/dL (ref 8.9–10.3)
Chloride: 99 mmol/L (ref 98–111)
Creatinine, Ser: 0.77 mg/dL (ref 0.44–1.00)
GFR, Estimated: >60 mL/min (ref >60)
Glucose, Bld: 101 mg/dL — ABNORMAL HIGH (ref 70–99)
Potassium: 4.9 mmol/L (ref 3.5–5.1)
Sodium: 132 mmol/L — ABNORMAL LOW (ref 135–145)

## 2021-12-24 LAB — TYPE AND SCREEN
ABO/RH(D): O POS
Antibody Screen: NEGATIVE

## 2021-12-24 LAB — CBC
HCT: 41.1 % (ref 36.0–46.0)
Hemoglobin: 13.6 g/dL (ref 12.0–15.0)
MCH: 31.1 pg (ref 26.0–34.0)
MCHC: 33.1 g/dL (ref 30.0–36.0)
MCV: 93.8 fL (ref 80.0–100.0)
Platelets: 205 10*3/uL (ref 150–400)
RBC: 4.38 MIL/uL (ref 3.87–5.11)
RDW: 13.2 % (ref 11.5–15.5)
WBC: 9.6 10*3/uL (ref 4.0–10.5)
nRBC: 0 % (ref 0.0–0.2)

## 2021-12-24 NOTE — Progress Notes (Addendum)
COVID Vaccine Completed: Yes Date COVID Vaccine completed: 12/31/20 x 5 COVID vaccine manufacturer: [place] Test: 12/31/21 @ 9:45 AM COVID Teast: 12/31/21 @ 9:45 AM PCP - Daphane Shepherd: PA Cardiologist -   Chest x-ray -  EKG -  Stress Test -  ECHO -  Cardiac Cath -  Pacemaker/ICD device last checked:  Sleep Study -  CPAP -   Fasting Blood Sugar -  Checks Blood Sugar _____ times a day  Blood Thinner Instructions: Aspirin Instructions: Last Dose:  Anesthesia review: Hx: HTN. BP: 198/104,186/106. Pt. Was advised by Luther Redo PA, to check BP for the next 3 days at home.If BP continue elevated to contact PCP on Monday.  Patient denies shortness of breath, fever, cough and chest pain at PAT appointment   Patient verbalized understanding of instructions that were given to them at the PAT appointment. Patient was also instructed that they will need to review over the PAT instructions again at home before surgery.

## 2021-12-27 ENCOUNTER — Encounter (HOSPITAL_COMMUNITY): Payer: Self-pay | Admitting: Surgery

## 2021-12-27 DIAGNOSIS — E0590 Thyrotoxicosis, unspecified without thyrotoxic crisis or storm: Secondary | ICD-10-CM | POA: Diagnosis present

## 2021-12-27 DIAGNOSIS — E042 Nontoxic multinodular goiter: Secondary | ICD-10-CM | POA: Diagnosis present

## 2021-12-27 DIAGNOSIS — C73 Malignant neoplasm of thyroid gland: Secondary | ICD-10-CM | POA: Diagnosis present

## 2021-12-27 NOTE — H&P (Signed)
REFERRING PHYSICIAN: Philemon Kingdom, MD  PROVIDER: Kirin Pastorino Charlotta Newton, MD  Chief Complaint: New Consultation (Papillary thyroid carcinoma)   History of Present Illness:  Patient is referred by Dr. Philemon Kingdom for surgical evaluation and management of newly diagnosed papillary thyroid carcinoma. Patient has a history of thyroid disease including hyperthyroidism and thyroid nodules. She has been followed at least since 2015 when she has a documented ultrasound examination. Patient has been treated with methimazole for hyperthyroidism. She currently takes 5 mg twice daily. Her most recent TSH level was undetectable. Patient underwent an ultrasound examination on September 30, 2021. This demonstrated bilateral nodules. The nodule in the left mid thyroid lobe measuring 1.2 cm was felt to be highly suspicious and biopsy was recommended. Cytopathology demonstrated evidence of papillary thyroid carcinoma. Patient is now referred by her endocrinologist for surgical evaluation for total thyroidectomy for management of newly diagnosed carcinoma and hyperthyroidism. Patient has had no prior head or neck surgery. She works at [employer].  Review of Systems: A complete review of systems was obtained from the patient. I have reviewed this information and discussed as appropriate with the patient. See HPI as well for other ROS.  Review of Systems  Constitutional: Positive for diaphoresis.  HENT: Negative.  Eyes: Negative.  Respiratory: Negative.  Cardiovascular: Negative.  Gastrointestinal: Negative.  Genitourinary: Negative.  Musculoskeletal: Negative.  Skin: Negative.  Neurological: Negative.  Endo/Heme/Allergies: Negative.  Psychiatric/Behavioral: Negative.    Medical History: Past Medical History:  Diagnosis Date   Arthritis   History of cancer   Thyroid disease   Patient Active Problem List  Diagnosis   Papillary thyroid carcinoma (CMS-HCC)   Multiple thyroid  nodules   Hyperthyroidism, unspecified   Past Surgical History:  Procedure Laterality Date   hip placement 07/13/2011  right   hip placement 05/06/2015  left   HYSTERECTOMY    Allergies  Allergen Reactions   Propoxyphene Hallucination and Other (See Comments)  "climb the walls" "climb the walls"   Current Outpatient Medications on File Prior to Visit  Medication Sig Dispense Refill   alendronate (FOSAMAX) 70 MG tablet   methIMAzole (TAPAZOLE) 5 MG tablet   SUMAtriptan (IMITREX) 100 MG tablet TAKE ONE TABLET (100 MG DOSE) BY MOUTH AS NEEDED.   No current facility-administered medications on file prior to visit.   Family History  Problem Relation Age of Onset   Skin cancer Mother   Breast cancer Mother   Breast cancer Maternal Grandmother    Social History   Tobacco Use  Smoking Status Former   Types: Cigarettes  Smokeless Tobacco Never    Social History   Socioeconomic History   Marital status: Single  Tobacco Use   Smoking status: Former  Types: Cigarettes   Smokeless tobacco: Never  Substance and Sexual Activity   Alcohol use: Never   Drug use: Never   Objective:   Vitals:  BP: 120/78  Pulse: 77  Temp: 36.3 C (97.4 F)  SpO2: 97%  Weight: 57.9 kg (127 lb 9.6 oz)  Height: 160 cm (5\' 3" )   Body mass index is 22.6 kg/m.  Physical Exam   GENERAL APPEARANCE Development: normal Nutritional status: normal Gross deformities: none  SKIN Rash, lesions, ulcers: none Induration, erythema: none Nodules: none palpable  EYES Conjunctiva and lids: normal Pupils: equal and reactive Iris: normal bilaterally  EARS, NOSE, MOUTH, THROAT External ears: no lesion or deformity External nose: no lesion or deformity Hearing: grossly normal Due to Covid-19 pandemic, patient  is wearing a mask.  NECK Symmetric: no Trachea: midline Thyroid: In the right thyroid lobe is a soft smooth approximately 3 cm nodule which is nontender and mobile with  swallowing. Left thyroid lobe has no palpable nodularity. There is no associated lymphadenopathy.  CHEST Respiratory effort: normal Retraction or accessory muscle use: no Breath sounds: normal bilaterally Rales, rhonchi, wheeze: none  CARDIOVASCULAR Auscultation: regular rhythm, normal rate Murmurs: none Pulses: radial pulse 2+ palpable Lower extremity edema: none  MUSCULOSKELETAL Station and gait: normal Digits and nails: no clubbing or cyanosis Muscle strength: grossly normal all extremities Range of motion: grossly normal all extremities Deformity: none  LYMPHATIC Cervical: none palpable Supraclavicular: none palpable  PSYCHIATRIC Oriented to person, place, and time: yes Mood and affect: normal for situation Judgment and insight: appropriate for situation  Assessment and Plan:   Papillary thyroid carcinoma (CMS-HCC)  Multiple thyroid nodules  Hyperthyroidism, unspecified   Patient is referred by her endocrinologist, Dr. Philemon Kingdom, for surgical evaluation and management of hyperthyroidism and newly diagnosed papillary thyroid carcinoma. Patient is currently taking methimazole. She has bilateral thyroid nodules. The nodule in the mid left thyroid lobe measuring 1.2 cm was biopsied and demonstrated papillary thyroid carcinoma. The patient presents today to discuss thyroid surgery.  Based on the above findings, I have recommended proceeding with total thyroidectomy with limited central compartment lymph node dissection. We discussed the procedure in detail. We discussed the size and location of the surgical incision. We discussed the risk and benefits of surgery including the risk of recurrent laryngeal nerve injury and injury to parathyroid glands. We discussed the need for lifelong thyroid hormone replacement. We discussed the potential need for radioactive iodine treatment. We discussed that hospitalization to be required and the postoperative recovery to be  anticipated. The patient understands wishes to proceed with surgery in the near future.  The risks and benefits of the procedure have been discussed at length with the patient. The patient understands the proposed procedure, potential alternative treatments, and the course of recovery to be expected. All of the patient's questions have been answered at this time. The patient wishes to proceed with surgery.  Armandina Gemma, MD [facility] [place] [facility] Office: [phone]

## 2021-12-30 ENCOUNTER — Ambulatory Visit (INDEPENDENT_AMBULATORY_CARE_PROVIDER_SITE_OTHER): Payer: BC Managed Care – PPO | Admitting: Internal Medicine

## 2021-12-30 ENCOUNTER — Other Ambulatory Visit: Payer: Self-pay

## 2021-12-30 ENCOUNTER — Encounter: Payer: Self-pay | Admitting: Internal Medicine

## 2021-12-30 VITALS — BP 126/88 | HR 87 | Ht 63.0 in | Wt 131.4 lb

## 2021-12-30 DIAGNOSIS — E0500 Thyrotoxicosis with diffuse goiter without thyrotoxic crisis or storm: Secondary | ICD-10-CM | POA: Diagnosis not present

## 2021-12-30 DIAGNOSIS — C73 Malignant neoplasm of thyroid gland: Secondary | ICD-10-CM | POA: Diagnosis not present

## 2021-12-30 DIAGNOSIS — E041 Nontoxic single thyroid nodule: Secondary | ICD-10-CM

## 2021-12-30 LAB — TSH: TSH: 0.11 u[IU]/mL — ABNORMAL LOW (ref 0.35–5.50)

## 2021-12-30 LAB — T4, FREE: Free T4: 0.99 ng/dL (ref 0.60–1.60)

## 2021-12-30 LAB — T3, FREE: T3, Free: 3.5 pg/mL (ref 2.3–4.2)

## 2021-12-30 NOTE — Progress Notes (Signed)
Patient ID: Carol King, female   DOB: February 21, 1951, 71 y.o.   MRN: [id number]  This visit occurred during the SARS-CoV-2 public health emergency.  Safety protocols were in place, including screening questions prior to the visit, additional usage of staff PPE, and extensive cleaning of exam room while observing appropriate contact time as indicated for disinfecting solutions.   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  She was previously seen by Dr. Dwyane Dee once, in 09/2014.  Last visit 3 months ago.  Previous visit 3 years prior.  Interim history: Patient returns in 09/2021 after she developed hot flashes and sweating.  Her TFTs were thyrotoxic.  We started methimazole.  Her symptoms improved (she mentions that they actually improved shortly before starting Methimazole!). Since last visit, during investigation for thyroid nodule she was found to have papillary thyroid cancer and has thyroidectomy surgery pending for 01/04/2022 with Dr. Harlow Asa. She had a sBP of 200/100 at the presurgical eval. She was started on Amlodipine 5 mg daily prn for sBP >140. BP improved. She has mild congestion and a dry cough.  Reviewed and addended history: She has a long history of suppressed TSH, per my records, this was first documented in 2014.  Since then, she saw Dr. Dwyane Dee in 2015 and he checked a thyroid uptake and scan and a thyroid ultrasound.  She did not return to see him afterwards.  TFTs in 2017 and 2018 were worse.  She started to see me in 2019, and we have been adjusting her methimazole dose, but she was lost for follow-up with me, also, after her visit in 08/2018.  She now returns after a new referral was made due to persistently abnormal TFTs.  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  She lost a significant amount of  weight in the past that she could not eat.  Weight improved afterwards.   03/2018: Methimazole was started at 5 mg twice a day 06/2018: Methimazole dose was decreased to 5 mg daily 08/2018: MMI dose was increased to 5 mg in a.m. and 2.5 mg in p.m.  2020: Ran out [place]    09/2021: Reestablished care with me -restarted MMI 5 mg twice a day  I reviewed patient's TFTs: Lab Results  Component Value Date   TSH 0.00 (L) 10/27/2021   TSH <0.01 (L) 08/15/2018   TSH 0.02 (L) 06/20/2018   TSH <0.01 (L) 05/10/2018   TSH <0.01 Repeated and verified X2. (L) 03/27/2018   TSH <0.006 (L) 06/10/2017   TSH 0.03 (L) 06/08/2016   TSH 0.011 (L) 07/15/2014   TSH 0.288 (L) 04/19/2013   FREET4 1.19 10/27/2021   FREET4 1.28 08/15/2018   FREET4 0.55 (L) 06/20/2018   FREET4 1.13 05/10/2018   FREET4 1.94 (H) 03/27/2018   FREET4 3.51 (H) 06/10/2017   FREET4 1.9 (H) 06/08/2016   FREET4 1.17 09/18/2014   T3FREE 3.0 10/27/2021   T3FREE 5.2 (H) 08/15/2018   T3FREE 2.6 06/20/2018   T3FREE 3.1 05/10/2018   T3FREE 4.9 (H) 03/27/2018   T3FREE 4.0 06/08/2016   T3FREE 2.6 09/18/2014  09/24/2021: TSH <0.005, free T4 2.02,  TT3  122 (71-180) 09/30/2020: TSH <0.005, free T4 2.47 (0.82-1.77), WBC normal, 6.7 07/06/2019: TSH <0.006,  normal total T3 and total T4  Her Graves' antibodies were elevated: Lab Results  Component Value Date  TSI 427 (H) 03/27/2018    Thyroid uptake and scan (10/08/2014): Uptake 9.9%, consistent with subacute thyroiditis.  Possible cold nodule in the lower right lobe.  Thyroid ultrasound (10/31/2014): 1.8 cm right thyroid nodule, corresponding to the cold nodule on the thyroid scan. She refused a thyroid biopsy at that time.  Thyroid U/S (09/30/2021): Parenchymal Echotexture: Mildly heterogeneous Isthmus: 0.2 cm Right lobe: 5.5 x 2.2 x 2.1 cm Left lobe: 4.1 x 1.5 x 1.6 cm _________________________________________________________   Nodule 1: 3.6 x 3.1 x 2.2 cm cystic nodule in the  inferior right thyroid lobe does not meet criteria for FNA or imaging follow-up. It has increased in size since prior examination, however predominately due to accumulation of fluid.   _________________________________________________________   Nodule 2: 1.0 x 0.7 x 0.7 cm spongiform nodule in the superior left thyroid lobe does not meet criteria for FNA or imaging surveillance. _________________________________________________________   Nodule # 3: Location: Left; mid Maximum size: 1.2 cm; Other 2 dimensions: 0.8 x 0.7 cm Composition: solid/almost completely solid (2) Echogenicity: hypoechoic (2) Echogenic foci: punctate echogenic foci (3)   **Given size (>/= 1.0 cm) and appearance, fine needle aspiration of this highly suspicious nodule should be considered based on TI-RADS criteria.    _________________________________________________________   IMPRESSION: Nodule 3 (TI-RADS 5) located in the mid left thyroid lobe meets criteria for FNA.  FNA of the left mid thyroid nodule (10/27/2021): THYROID, LEFT MID, FINE NEEDLE ASPIRATION:   FINAL MICROSCOPIC DIAGNOSIS:  - Findings consistent with papillary carcinoma (Bethesda category VI)  - See comment   SPECIMEN ADEQUACY:  Satisfactory for evaluation   Pt denies: - hoarseness - dysphagia - choking - SOB with lying down  No FH of thyroid cancer. No h/o radiation tx to head or neck.  She does have family history of hypothyroidism.  No herbal supplements. No Biotin use. No recent steroids use.   She also has a history of osteoporosis per DEXA scan from 09/2017.  She had a stress fracture in 01/2018 which evolved to a complete fracture. After her fracture, she used a lot of NSAIDs and she developed stomach and duodenal ulcers and actually developed a duodenal stricture.   ROS: + See HPI + tinnitus, + hypoacusis + HAs (since she was a child)  I reviewed patient medications, allergies, past medical history, social history,  family history and changes were documented in the history of present illness.  Past Medical History:  Diagnosis Date   Arthritis    Complication of anesthesia    Difficulty sleeping    DUE TO HIP PAIN   Headache    MIGRAINES   History of skin cancer    Hypertension    PONV (postoperative nausea and vomiting)    SEVERAL YRS AGO - NONE WITH MORE RECENT SURGERIES   Skin cancer    BCC, SCC   Past Surgical History:  Procedure Laterality Date   ABDOMINAL HYSTERECTOMY  1981   one ovary remains   APPENDECTOMY     ESOPHAGOGASTRODUODENOSCOPY (EGD) WITH PROPOFOL N/A 03/02/2018   Procedure: ESOPHAGOGASTRODUODENOSCOPY (EGD) WITH PROPOFOL;  Surgeon: Doran Stabler, MD;  Location: [facility];  Service: Gastroenterology;  Laterality: N/A;   ESOPHAGOGASTRODUODENOSCOPY (EGD) WITH PROPOFOL N/A 03/21/2018   Procedure: ESOPHAGOGASTRODUODENOSCOPY (EGD) WITH PROPOFOL;  Surgeon: Doran Stabler, MD;  Location: [facility];  Service: Gastroenterology;  Laterality: N/A;   SKIN CANCER EXCISION     TONSILLECTOMY AND ADENOIDECTOMY     TOTAL HIP ARTHROPLASTY Right 07/11/2011  HIP; [facility]   TOTAL HIP ARTHROPLASTY Left 05/06/2015   Procedure: LEFT TOTAL HIP ARTHROPLASTY ANTERIOR APPROACH;  Surgeon: Paralee Cancel, MD;  Location: [facility];  Service: Orthopedics;  Laterality: Left;   Social History   Socioeconomic History   Marital status: Single    Spouse name: n/a   Number of children: 0   Years of education: college   Highest education level: Not on file  Occupational History   Occupation: Aeronautical engineer: [employer]   Occupation: IT consultant: [employer]  Tobacco Use   Smoking status: Former    Types: Cigarettes    Quit date: 04/30/1994    Years since quitting: 27.6   Smokeless tobacco: Former    Quit date: 03/09/1993  Vaping Use   Vaping Use: Never used  Substance and Sexual Activity   Alcohol use: No    Alcohol/week: 0.0  standard drinks   Drug use: No   Sexual activity: Never  Other Topics Concern   Not on file  Social History Narrative   Home Environment: Lives at home alone with her two dogs. One level home with a few stairs to the front and back doors.   Diet: Stays clear of carbohydrates and chocolate because they trigger headaches. Likes meat/chicken and vegetables. Generally healthy.   Exercise: Stays busy and active at work. Takes the stairs instead of the elevator.   Sleep: Significantly improved since hip replacement surgeries. Goes to bed around 9 and wakes up at 6. Able to work full days.   Work: Still enjoys working at [employer].   Urinary problems: No concerns. Hydrates well.      Brother lives in [place], [place]   Social Determinants of Health   Financial Resource Strain: Not on file  Food Insecurity: Not on file  Transportation Needs: Not on file  Physical Activity: Not on file  Stress: Not on file  Social Connections: Not on file  Intimate Partner Violence: Not on file   Current Outpatient Medications on File Prior to Visit  Medication Sig Dispense Refill   acetaminophen (TYLENOL) 325 MG tablet Take 2 tablets (650 mg total) by mouth every 6 (six) hours as needed for mild pain (or Fever >/= 101). (Patient not taking: Reported on 12/15/2021)     alendronate (FOSAMAX) 70 MG tablet Take 1 tablet (70 mg total) by mouth every 7 (seven) days. Take with a full glass of water on an empty stomach. 12 tablet 3   Calcium Carbonate Antacid (TUMS ULTRA 1000 PO) Take 1,000 mg by mouth daily.     methimazole (TAPAZOLE) 5 MG tablet TAKE 1 TABLET BY MOUTH TWICE A DAY 90 tablet 5   Multiple Vitamin (MULTIVITAMIN WITH MINERALS) TABS tablet Take 1 tablet by mouth daily.     Omega-3 Fatty Acids (FISH OIL PO) Take 1 capsule by mouth daily.     pantoprazole (PROTONIX) 40 MG tablet TAKE 1 TABLET (40 MG TOTAL) BY MOUTH 2 (TWO) TIMES DAILY BEFORE A MEAL. (Patient not taking: Reported on 09/25/2021) 60 tablet 0    SUMAtriptan (IMITREX) 100 MG tablet Take 1 as needed for migraineMay repeat in 2 hours if headache persists or recurs. 9 tablet 12   No current facility-administered medications on file prior to visit.   Allergies  Allergen Reactions   Darvon [Propoxyphene Hcl] Other (See Comments)    "climb the walls"   Vicodin [Hydrocodone-Acetaminophen] Other (See Comments)    "climb the walls"  Family History  Problem Relation Age of Onset   Cancer Mother 13       breast cancer   Cancer Maternal Grandmother        breast cancer   Kidney disease Maternal Grandfather    Arthritis Paternal Grandmother     PE: BP 126/88 (BP Location: Right Arm, Patient Position: Sitting, Cuff Size: Normal)    Pulse 87    Ht '5\' 3"'  (1.6 m)    Wt 131 lb 6.4 oz (59.6 kg)    SpO2 96%    BMI 23.28 kg/m  Wt Readings from Last 3 Encounters:  12/30/21 131 lb 6.4 oz (59.6 kg)  09/25/21 133 lb (60.3 kg)  08/15/18 121 lb (54.9 kg)   Constitutional: normal weight, in NAD Eyes: PERRLA, EOMI, no exophthalmos ENT: moist mucous membranes, no thyromegaly, no cervical lymphadenopathy Cardiovascular: RRR, No MRG Respiratory: CTA B Musculoskeletal: no deformities, strength intact in all 4 Skin: moist, warm Neurological: no tremor with outstretched hands, DTR normal in all 4  ASSESSMENT: 1.  Thyrotoxicosis  2.  Thyroid nodules  3.  Papillary thyroid cancer  PLAN:  1. Patient with a long history of low TSH, without thyrotoxic symptoms: No weight loss, hyper defecation, palpitations, anxiety.  However, in the past she had heat intolerance and sweating, which reappeared before last visit. -When I last saw her, we checked her TFTs and they were persistently thyrotoxic and I again suggested to restart methimazole.  We started at 5 mg twice a day.  She feels better after starting this. -We also discussed about definitive treatment for Graves' disease to include continuing methimazole use, RAI treatment, or surgery -Since last  visit, one of her thyroid nodules turned out to be cancerous on biopsy so she has thyroidectomy planned -discussed what to expect after surgery -I will see her back in 1.5 mo  2. Thyroid nodules and 3. PTC -Patient has a history of a right thyroid nodule for which she refused further investigation in the past -Asked that she reestablish care with me in 09/2021, we checked another thyroid ultrasound and since this nodule was still suspicious, I again suggested biopsy.  This was consistent with papillary thyroid cancer. -I referred her to surgery and she has this planned in 5 days with Dr. Harlow Asa.  She will have total thyroidectomy. -We discussed about the need for levothyroxine after the surgery.  Discussed about how to take this correctly. -She denies neck compression symptoms  Component     Latest Ref Rng & Units 12/30/2021  TSH     0.35 - 5.50 uIU/mL 0.11 (L)  T4,Free(Direct)     0.60 - 1.60 ng/dL 0.99  Triiodothyronine,Free,Serum     2.3 - 4.2 pg/mL 3.5  TSH improved. Free thyroid hormones normal.  We can continue the same dose of methimazole for now.  Philemon Kingdom, MD PhD [facility] Endocrinology

## 2021-12-30 NOTE — Patient Instructions (Addendum)
Continue Methimazole 5 mg 2x a day for now.  After surgery, you will be on Levothyroxine.  Take the thyroid hormone every day, with water, at least 30 minutes before breakfast, separated by at least 4 hours from: - acid reflux medications - calcium - iron - multivitamins  Take Fosamax 30 min before levothyroxine.  Please come back for another visit in 5-6 weeks.

## 2021-12-31 ENCOUNTER — Encounter (HOSPITAL_COMMUNITY)
Admission: RE | Admit: 2021-12-31 | Discharge: 2021-12-31 | Disposition: A | Discharge status: Home / Self Care | Payer: BC Managed Care – PPO | Source: Ambulatory Visit | Attending: Surgery | Admitting: Surgery

## 2021-12-31 ENCOUNTER — Other Ambulatory Visit: Payer: Self-pay

## 2021-12-31 DIAGNOSIS — Z20822 Contact with and (suspected) exposure to covid-19: Secondary | ICD-10-CM | POA: Insufficient documentation

## 2021-12-31 DIAGNOSIS — Z01812 Encounter for preprocedural laboratory examination: Secondary | ICD-10-CM | POA: Diagnosis present

## 2021-12-31 DIAGNOSIS — Z01818 Encounter for other preprocedural examination: Secondary | ICD-10-CM

## 2021-12-31 LAB — SARS CORONAVIRUS 2 (TAT 6-24 HRS): SARS Coronavirus 2: NEGATIVE

## 2022-01-03 ENCOUNTER — Encounter (HOSPITAL_COMMUNITY): Payer: Self-pay | Admitting: Surgery

## 2022-01-03 NOTE — Anesthesia Preprocedure Evaluation (Addendum)
Anesthesia Evaluation  Patient identified by MRN, date of birth, ID band Patient awake    Reviewed: Allergy & Precautions, NPO status , Patient's Chart, lab work & pertinent test results  History of Anesthesia Complications (+) PONV and history of anesthetic complications (remote hx PONV w/ hysterectomy, no issues w/ THR x 2 recently)  Airway Mallampati: I  TM Distance: >3 FB Neck ROM: Full    Dental  (+) Teeth Intact, Dental Advisory Given   Pulmonary former smoker,  Quit smoking 1995   Pulmonary exam normal breath sounds clear to auscultation       Cardiovascular hypertension (140/80 this AM- was very high in preop, started on amlodipine ), Pt. on medications Normal cardiovascular exam Rhythm:Regular Rate:Normal     Neuro/Psych  Headaches, negative psych ROS   GI/Hepatic Neg liver ROS, GERD  Medicated and Controlled,  Endo/Other  Hyperthyroidism (night sweats, hot flashes) Papillary thyroid carcinoma, multiple thyroid nodules Hyperthyroid- on methimazole  Renal/GU negative Renal ROS  negative genitourinary   Musculoskeletal  (+) Arthritis , Osteoarthritis,  Fibromyalgia -  Abdominal   Peds  Hematology negative hematology ROS (+) hct 41.1   Anesthesia Other Findings   Reproductive/Obstetrics negative OB ROS                           Anesthesia Physical Anesthesia Plan  ASA: 2  Anesthesia Plan: General   Post-op Pain Management: Tylenol PO (pre-op)   Induction: Intravenous  PONV Risk Score and Plan: 4 or greater and Ondansetron, Aprepitant, Dexamethasone, Midazolam, Treatment may vary due to age or medical condition and Scopolamine patch - Pre-op  Airway Management Planned: Oral ETT  Additional Equipment: None  Intra-op Plan:   Post-operative Plan: Extubation in OR  Informed Consent: I have reviewed the patients History and Physical, chart, labs and discussed the procedure  including the risks, benefits and alternatives for the proposed anesthesia with the patient or authorized representative who has indicated his/her understanding and acceptance.     Dental advisory given  Plan Discussed with: CRNA  Anesthesia Plan Comments:         Anesthesia Quick Evaluation

## 2022-01-04 ENCOUNTER — Ambulatory Visit (HOSPITAL_COMMUNITY)
Admission: RE | Admit: 2022-01-04 | Discharge: 2022-01-05 | Disposition: A | Discharge status: Home / Self Care | Payer: BC Managed Care – PPO | Source: Ambulatory Visit | Attending: Surgery | Admitting: Surgery

## 2022-01-04 ENCOUNTER — Encounter (HOSPITAL_COMMUNITY): Payer: Self-pay | Admitting: Surgery

## 2022-01-04 ENCOUNTER — Ambulatory Visit (HOSPITAL_COMMUNITY): Payer: BC Managed Care – PPO | Admitting: Physician Assistant

## 2022-01-04 ENCOUNTER — Other Ambulatory Visit: Payer: Self-pay

## 2022-01-04 ENCOUNTER — Encounter (HOSPITAL_COMMUNITY)
Admission: RE | Disposition: A | Discharge status: Home / Self Care | Payer: Self-pay | Source: Ambulatory Visit | Attending: Surgery

## 2022-01-04 DIAGNOSIS — E042 Nontoxic multinodular goiter: Secondary | ICD-10-CM | POA: Diagnosis present

## 2022-01-04 DIAGNOSIS — Z79899 Other long term (current) drug therapy: Secondary | ICD-10-CM | POA: Insufficient documentation

## 2022-01-04 DIAGNOSIS — C73 Malignant neoplasm of thyroid gland: Secondary | ICD-10-CM | POA: Diagnosis present

## 2022-01-04 DIAGNOSIS — R519 Headache, unspecified: Secondary | ICD-10-CM | POA: Diagnosis not present

## 2022-01-04 DIAGNOSIS — I10 Essential (primary) hypertension: Secondary | ICD-10-CM | POA: Diagnosis not present

## 2022-01-04 DIAGNOSIS — E0520 Thyrotoxicosis with toxic multinodular goiter without thyrotoxic crisis or storm: Secondary | ICD-10-CM | POA: Diagnosis not present

## 2022-01-04 DIAGNOSIS — E0590 Thyrotoxicosis, unspecified without thyrotoxic crisis or storm: Secondary | ICD-10-CM | POA: Diagnosis present

## 2022-01-04 HISTORY — PX: THYROIDECTOMY: SHX17

## 2022-01-04 HISTORY — PX: LYMPH NODE DISSECTION: SHX5087

## 2022-01-04 SURGERY — THYROIDECTOMY
Anesthesia: General

## 2022-01-04 MED ORDER — MIDAZOLAM HCL 2 MG/2ML IJ SOLN
INTRAMUSCULAR | Status: AC
  Filled 2022-01-04: qty 2

## 2022-01-04 MED ORDER — ONDANSETRON HCL 4 MG/2ML IJ SOLN
INTRAMUSCULAR | Status: DC | PRN
Start: 2022-01-04 — End: 2022-01-04
  Administered 2022-01-04: 4 mg via INTRAVENOUS

## 2022-01-04 MED ORDER — LIP MEDEX EX OINT
TOPICAL_OINTMENT | CUTANEOUS | Status: DC | PRN
  Filled 2022-01-04: qty 7

## 2022-01-04 MED ORDER — ORAL CARE MOUTH RINSE: 15.0000 mL | Freq: Once | OROMUCOSAL | Status: AC

## 2022-01-04 MED ORDER — ONDANSETRON HCL 4 MG/2ML IJ SOLN
INTRAMUSCULAR | Status: AC
  Filled 2022-01-04: qty 2

## 2022-01-04 MED ORDER — HYDROMORPHONE HCL 1 MG/ML IJ SOLN
INTRAMUSCULAR | Status: AC
  Administered 2022-01-04: 0.5 mg via INTRAVENOUS
  Filled 2022-01-04: qty 1

## 2022-01-04 MED ORDER — MIDAZOLAM HCL 5 MG/5ML IJ SOLN
INTRAMUSCULAR | Status: DC | PRN
  Administered 2022-01-04: 2 mg via INTRAVENOUS

## 2022-01-04 MED ORDER — PROPOFOL 10 MG/ML IV BOLUS
INTRAVENOUS | Status: AC
  Filled 2022-01-04: qty 20

## 2022-01-04 MED ORDER — ROCURONIUM BROMIDE 10 MG/ML (PF) SYRINGE
PREFILLED_SYRINGE | INTRAVENOUS | Status: DC | PRN
  Administered 2022-01-04: 70 mg via INTRAVENOUS
  Administered 2022-01-04: 10 mg via INTRAVENOUS

## 2022-01-04 MED ORDER — HYDROMORPHONE HCL 1 MG/ML IJ SOLN: 1.0000 mg | INTRAMUSCULAR | Status: DC | PRN

## 2022-01-04 MED ORDER — CEFAZOLIN SODIUM-DEXTROSE 2-4 GM/100ML-% IV SOLN
2.0000 g | INTRAVENOUS | Status: DC
  Filled 2022-01-04: qty 100

## 2022-01-04 MED ORDER — ONDANSETRON 4 MG PO TBDP: 4.0000 mg | ORAL_TABLET | Freq: Four times a day (QID) | ORAL | Status: DC | PRN

## 2022-01-04 MED ORDER — FENTANYL CITRATE (PF) 100 MCG/2ML IJ SOLN
INTRAMUSCULAR | Status: DC | PRN
Start: 2022-01-04 — End: 2022-01-04
  Administered 2022-01-04 (×2): 50 ug via INTRAVENOUS

## 2022-01-04 MED ORDER — BUPIVACAINE-EPINEPHRINE (PF) 0.25% -1:200000 IJ SOLN
INTRAMUSCULAR | Status: AC
  Filled 2022-01-04: qty 30

## 2022-01-04 MED ORDER — PROMETHAZINE HCL 25 MG/ML IJ SOLN
INTRAMUSCULAR | Status: AC
  Filled 2022-01-04: qty 1

## 2022-01-04 MED ORDER — SODIUM CHLORIDE 0.45 % IV SOLN: INTRAVENOUS | Status: DC

## 2022-01-04 MED ORDER — CHLORHEXIDINE GLUCONATE 0.12 % MT SOLN
15.0000 mL | Freq: Once | OROMUCOSAL | Status: AC
  Administered 2022-01-04: 15 mL via OROMUCOSAL

## 2022-01-04 MED ORDER — ACETAMINOPHEN 650 MG RE SUPP: 650.0000 mg | Freq: Four times a day (QID) | RECTAL | Status: DC | PRN

## 2022-01-04 MED ORDER — ACETAMINOPHEN 325 MG PO TABS: 650.0000 mg | ORAL_TABLET | Freq: Four times a day (QID) | ORAL | Status: DC | PRN

## 2022-01-04 MED ORDER — PROPOFOL 10 MG/ML IV BOLUS
INTRAVENOUS | Status: DC | PRN
  Administered 2022-01-04: 100 mg via INTRAVENOUS

## 2022-01-04 MED ORDER — PANTOPRAZOLE SODIUM 40 MG PO TBEC
40.0000 mg | DELAYED_RELEASE_TABLET | Freq: Two times a day (BID) | ORAL | Status: DC
  Administered 2022-01-04 – 2022-01-05 (×2): 40 mg via ORAL
  Filled 2022-01-04 (×2): qty 1

## 2022-01-04 MED ORDER — DEXAMETHASONE SODIUM PHOSPHATE 10 MG/ML IJ SOLN
INTRAMUSCULAR | Status: AC
  Filled 2022-01-04: qty 1

## 2022-01-04 MED ORDER — OXYCODONE HCL 5 MG PO TABS: 5.0000 mg | ORAL_TABLET | Freq: Once | ORAL | Status: DC | PRN

## 2022-01-04 MED ORDER — HYDROMORPHONE HCL 1 MG/ML IJ SOLN: 0.2500 mg | INTRAMUSCULAR | Status: DC | PRN

## 2022-01-04 MED ORDER — LIDOCAINE HCL (PF) 2 % IJ SOLN
INTRAMUSCULAR | Status: AC
  Filled 2022-01-04: qty 5

## 2022-01-04 MED ORDER — HEMOSTATIC AGENTS (NO CHARGE) OPTIME
TOPICAL | Status: DC | PRN
  Administered 2022-01-04: 1 via TOPICAL

## 2022-01-04 MED ORDER — LIDOCAINE HCL (PF) 1 % IJ SOLN
INTRAMUSCULAR | Status: AC
  Filled 2022-01-04: qty 30

## 2022-01-04 MED ORDER — AMISULPRIDE (ANTIEMETIC) 5 MG/2ML IV SOLN: 10.0000 mg | Freq: Once | INTRAVENOUS | Status: DC | PRN

## 2022-01-04 MED ORDER — CALCIUM CARBONATE 1250 (500 CA) MG PO TABS
2.0000 | ORAL_TABLET | Freq: Three times a day (TID) | ORAL | Status: DC
  Administered 2022-01-04 (×2): 1000 mg via ORAL
  Filled 2022-01-04 (×3): qty 1

## 2022-01-04 MED ORDER — OXYCODONE HCL 5 MG/5ML PO SOLN: 5.0000 mg | Freq: Once | ORAL | Status: DC | PRN

## 2022-01-04 MED ORDER — ACETAMINOPHEN 500 MG PO TABS
1000.0000 mg | ORAL_TABLET | Freq: Once | ORAL | Status: AC
  Administered 2022-01-04: 1000 mg via ORAL
  Filled 2022-01-04: qty 2

## 2022-01-04 MED ORDER — LACTATED RINGERS IV SOLN: INTRAVENOUS | Status: DC

## 2022-01-04 MED ORDER — PHENYLEPHRINE 40 MCG/ML (10ML) SYRINGE FOR IV PUSH (FOR BLOOD PRESSURE SUPPORT): PREFILLED_SYRINGE | INTRAVENOUS | Status: DC | PRN

## 2022-01-04 MED ORDER — APREPITANT 40 MG PO CAPS
40.0000 mg | ORAL_CAPSULE | Freq: Once | ORAL | Status: AC
  Administered 2022-01-04: 40 mg via ORAL
  Filled 2022-01-04: qty 1

## 2022-01-04 MED ORDER — FENTANYL CITRATE (PF) 100 MCG/2ML IJ SOLN
INTRAMUSCULAR | Status: AC
  Filled 2022-01-04: qty 2

## 2022-01-04 MED ORDER — SUMATRIPTAN SUCCINATE 50 MG PO TABS
100.0000 mg | ORAL_TABLET | ORAL | Status: DC | PRN
  Administered 2022-01-04: 50 mg via ORAL
  Filled 2022-01-04 (×3): qty 2

## 2022-01-04 MED ORDER — PROMETHAZINE HCL 25 MG/ML IJ SOLN: 6.2500 mg | INTRAMUSCULAR | Status: DC | PRN

## 2022-01-04 MED ORDER — CHLORHEXIDINE GLUCONATE CLOTH 2 % EX PADS: 6.0000 | MEDICATED_PAD | Freq: Once | CUTANEOUS | Status: DC

## 2022-01-04 MED ORDER — TRAMADOL HCL 50 MG PO TABS: 50.0000 mg | ORAL_TABLET | Freq: Four times a day (QID) | ORAL | Status: DC | PRN

## 2022-01-04 MED ORDER — LIDOCAINE HCL (CARDIAC) PF 100 MG/5ML IV SOSY
PREFILLED_SYRINGE | INTRAVENOUS | Status: DC | PRN
  Administered 2022-01-04: 60 mg via INTRAVENOUS

## 2022-01-04 MED ORDER — AMLODIPINE BESYLATE 5 MG PO TABS
5.0000 mg | ORAL_TABLET | Freq: Every day | ORAL | Status: DC
Start: 2022-01-05 — End: 2022-01-05
  Administered 2022-01-05: 08:00:00 5 mg via ORAL
  Filled 2022-01-04: qty 1

## 2022-01-04 MED ORDER — PHENYLEPHRINE HCL (PRESSORS) 10 MG/ML IV SOLN
INTRAVENOUS | Status: DC | PRN
  Administered 2022-01-04: 40 ug via INTRAVENOUS
  Administered 2022-01-04 (×3): 80 ug via INTRAVENOUS
  Administered 2022-01-04: 40 ug via INTRAVENOUS

## 2022-01-04 MED ORDER — ONDANSETRON HCL 4 MG/2ML IJ SOLN: 4.0000 mg | Freq: Four times a day (QID) | INTRAMUSCULAR | Status: DC | PRN

## 2022-01-04 MED ORDER — TRAMADOL HCL 50 MG PO TABS: 50.0000 mg | ORAL_TABLET | Freq: Once | ORAL | Status: DC

## 2022-01-04 MED ORDER — DEXAMETHASONE SODIUM PHOSPHATE 10 MG/ML IJ SOLN
INTRAMUSCULAR | Status: DC | PRN
  Administered 2022-01-04: 10 mg via INTRAVENOUS

## 2022-01-04 MED ORDER — SUGAMMADEX SODIUM 200 MG/2ML IV SOLN
INTRAVENOUS | Status: DC | PRN
  Administered 2022-01-04: 200 mg via INTRAVENOUS

## 2022-01-04 MED ORDER — LIDOCAINE HCL (PF) 2 % IJ SOLN
INTRAMUSCULAR | Status: DC | PRN
  Administered 2022-01-04: 1.5 mg/kg/h via INTRADERMAL

## 2022-01-04 MED ORDER — IBUPROFEN 200 MG PO TABS: 600.0000 mg | ORAL_TABLET | Freq: Four times a day (QID) | ORAL | Status: DC | PRN

## 2022-01-04 MED ORDER — SCOPOLAMINE 1 MG/3DAYS TD PT72
1.0000 | MEDICATED_PATCH | TRANSDERMAL | Status: DC
  Administered 2022-01-04: 1.5 mg via TRANSDERMAL
  Filled 2022-01-04: qty 1

## 2022-01-04 MED ORDER — ROCURONIUM BROMIDE 10 MG/ML (PF) SYRINGE
PREFILLED_SYRINGE | INTRAVENOUS | Status: AC
  Filled 2022-01-04: qty 10

## 2022-01-04 MED ORDER — 0.9 % SODIUM CHLORIDE (POUR BTL) OPTIME
TOPICAL | Status: DC | PRN
  Administered 2022-01-04: 1000 mL

## 2022-01-04 SURGICAL SUPPLY — 58 items
ATTRACTOMAT 16X20 MAGNETIC DRP (DRAPES) ×3 IMPLANT
BAG COUNTER SPONGE SURGICOUNT (BAG) ×2 IMPLANT
BAG SURGICOUNT SPONGE COUNTING (BAG) ×1
BLADE SURG 15 STRL LF DISP TIS (BLADE) ×1 IMPLANT
BLADE SURG 15 STRL SS (BLADE) ×2
BLADE SURG SZ10 CARB STEEL (BLADE) IMPLANT
CANISTER SUCT 3000ML PPV (MISCELLANEOUS) IMPLANT
CHLORAPREP W/TINT 26 (MISCELLANEOUS) ×6 IMPLANT
CLIP TI MEDIUM 6 (CLIP) ×6 IMPLANT
CLIP TI WIDE RED SMALL 6 (CLIP) ×8 IMPLANT
COVER BACK TABLE 60X90IN (DRAPES) ×3 IMPLANT
COVER MAYO STAND STRL (DRAPES) ×3 IMPLANT
COVER SURGICAL LIGHT HANDLE (MISCELLANEOUS) ×3 IMPLANT
DERMABOND ADVANCED (GAUZE/BANDAGES/DRESSINGS) ×2
DERMABOND ADVANCED .7 DNX12 (GAUZE/BANDAGES/DRESSINGS) ×1 IMPLANT
DRAPE LAPAROTOMY T 98X78 PEDS (DRAPES) ×3 IMPLANT
DRAPE UTILITY XL STRL (DRAPES) ×3 IMPLANT
ELECT COATED BLADE 2.86 ST (ELECTRODE) ×3 IMPLANT
ELECT PENCIL ROCKER SW 15FT (MISCELLANEOUS) ×3 IMPLANT
ELECT REM PT RETURN 15FT ADLT (MISCELLANEOUS) ×3 IMPLANT
GAUZE 4X4 16PLY ~~LOC~~+RFID DBL (SPONGE) ×3 IMPLANT
GLOVE SURG ORTHO LTX SZ8 (GLOVE) ×3 IMPLANT
GLOVE SURG SYN 7.5  E (GLOVE) ×6
GLOVE SURG SYN 7.5 E (GLOVE) ×2 IMPLANT
GLOVE SURG SYN 7.5 PF PI (GLOVE) ×2 IMPLANT
GOWN STRL REUS W/ TWL XL LVL3 (GOWN DISPOSABLE) ×1 IMPLANT
GOWN STRL REUS W/TWL 2XL LVL3 (GOWN DISPOSABLE) ×3 IMPLANT
GOWN STRL REUS W/TWL XL LVL3 (GOWN DISPOSABLE) ×9 IMPLANT
HEMOSTAT SURGICEL 2X4 FIBR (HEMOSTASIS) ×3 IMPLANT
ILLUMINATOR WAVEGUIDE N/F (MISCELLANEOUS) ×3 IMPLANT
KIT BASIN OR (CUSTOM PROCEDURE TRAY) ×3 IMPLANT
KIT TURNOVER KIT A (KITS) IMPLANT
NDL HYPO 25X1 1.5 SAFETY (NEEDLE) ×1 IMPLANT
NEEDLE HYPO 25X1 1.5 SAFETY (NEEDLE) IMPLANT
NS IRRIG 1000ML POUR BTL (IV SOLUTION) ×3 IMPLANT
PACK BASIC VI WITH GOWN DISP (CUSTOM PROCEDURE TRAY) ×3 IMPLANT
PENCIL SMOKE EVACUATOR (MISCELLANEOUS) IMPLANT
SHEARS HARMONIC 9CM CVD (BLADE) ×3 IMPLANT
SLEEVE SCD COMPRESS KNEE MED (STOCKING) ×2 IMPLANT
STAPLER VISISTAT 35W (STAPLE) IMPLANT
SUT MNCRL AB 4-0 PS2 18 (SUTURE) ×3 IMPLANT
SUT MON AB 3-0 SH 27 (SUTURE)
SUT MON AB 3-0 SH27 (SUTURE) IMPLANT
SUT MON AB 5-0 PS2 18 (SUTURE) ×1 IMPLANT
SUT SILK 3 0 SH 30 (SUTURE) IMPLANT
SUT VIC AB 3-0 SH 18 (SUTURE) ×6 IMPLANT
SUT VIC AB 3-0 SH 27 (SUTURE)
SUT VIC AB 3-0 SH 27X BRD (SUTURE) ×1 IMPLANT
SUT VICRYL 3 0 BR 18  UND (SUTURE)
SUT VICRYL 3 0 BR 18 UND (SUTURE) IMPLANT
SYR BULB EAR ULCER 3OZ GRN STR (SYRINGE) IMPLANT
SYR BULB IRRIG 60ML STRL (SYRINGE) ×3 IMPLANT
SYR CONTROL 10ML LL (SYRINGE) ×1 IMPLANT
TOWEL OR 17X26 10 PK STRL BLUE (TOWEL DISPOSABLE) ×6 IMPLANT
TOWEL OR NON WOVEN STRL DISP B (DISPOSABLE) ×3 IMPLANT
TUBING CONNECTING 10 (TUBING) ×2 IMPLANT
TUBING CONNECTING 10' (TUBING) ×1
YANKAUER SUCT BULB TIP NO VENT (SUCTIONS) IMPLANT

## 2022-01-04 NOTE — Transfer of Care (Signed)
Immediate Anesthesia Transfer of Care Note  Patient: Carol King  Procedure(s) Performed: TOTAL THYROIDECTOMY LIMITED LYMPH NODE DISSECTION  Patient Location: PACU  Anesthesia Type:General  Level of Consciousness: sedated  Airway & Oxygen Therapy: Patient Spontanous Breathing and Patient connected to face mask oxygen  Post-op Assessment: Report given to RN and Post -op Vital signs reviewed and stable  Post vital signs: Reviewed and stable  Last Vitals:  Vitals Value Taken Time  BP    Temp    Pulse 59 01/04/22 0920  Resp 10 01/04/22 0920  SpO2 100 % 01/04/22 0920  Vitals shown include unvalidated device data.  Last Pain:  Vitals:   01/04/22 0559  TempSrc:   PainSc: 0-No pain      Patients Stated Pain Goal: 3 (38/32/91 9166)  Complications: No notable events documented.

## 2022-01-04 NOTE — Anesthesia Postprocedure Evaluation (Signed)
Anesthesia Post Note  Patient: Carol King  Procedure(s) Performed: TOTAL THYROIDECTOMY LIMITED LYMPH NODE DISSECTION     Patient location during evaluation: PACU Anesthesia Type: General Level of consciousness: awake and alert, oriented and patient cooperative Pain management: pain level controlled Vital Signs Assessment: post-procedure vital signs reviewed and stable Respiratory status: spontaneous breathing, nonlabored ventilation and respiratory function stable Cardiovascular status: blood pressure returned to baseline and stable Postop Assessment: no apparent nausea or vomiting Anesthetic complications: no   No notable events documented.  Last Vitals:  Vitals:   01/04/22 1000 01/04/22 1037  BP: 140/71 140/78  Pulse: (!) 49 (!) 50  Resp: 12 16  Temp:  36.4 C  SpO2: 94% 97%    Last Pain:  Vitals:   01/04/22 1037  TempSrc: Oral  PainSc:                  Pervis Hocking

## 2022-01-04 NOTE — Anesthesia Procedure Notes (Signed)
Procedure Name: Intubation Date/Time: 01/04/2022 7:44 AM Performed by: Lind Covert, CRNA Pre-anesthesia Checklist: Patient identified, Emergency Drugs available, Suction available and Patient being monitored Patient Re-evaluated:Patient Re-evaluated prior to induction Oxygen Delivery Method: Circle system utilized Preoxygenation: Pre-oxygenation with 100% oxygen Induction Type: IV induction Ventilation: Mask ventilation without difficulty Laryngoscope Size: Mac and 3 Grade View: Grade I Tube type: Oral Tube size: 7.0 mm Number of attempts: 1 Airway Equipment and Method: Stylet Placement Confirmation: ETT inserted through vocal cords under direct vision, positive ETCO2 and breath sounds checked- equal and bilateral Secured at: 20 cm Tube secured with: Tape Dental Injury: Teeth and Oropharynx as per pre-operative assessment

## 2022-01-04 NOTE — Interval H&P Note (Signed)
History and Physical Interval Note:  01/04/2022 6:58 AM  Carol King  has presented today for surgery, with the diagnosis of PAPILLARY THYROID CARCINOMA, HYPERTHYROIDISM, MULTIPLE THYROID NODULES.  The various methods of treatment have been discussed with the patient and family. After consideration of risks, benefits and other options for treatment, the patient has consented to    Procedure(s): TOTAL THYROIDECTOMY (N/A) LIMITED LYMPH NODE DISSECTION (N/A) as a surgical intervention.    The patient's history has been reviewed, patient examined, no change in status, stable for surgery.  I have reviewed the patient's chart and labs.  Questions were answered to the patient's satisfaction.    Armandina Gemma, [facility] [place] [facility] Office: [place]

## 2022-01-04 NOTE — Op Note (Signed)
Procedure Note  Pre-operative Diagnosis:  papillary thyroid carcinoma, multiple thyroid nodules, hyperthyroidism  Post-operative Diagnosis:  same  Surgeon:  Armandina Gemma, MD  Assistant:  Carlena Hurl, PA-C   Procedure:  Total thyroidectomy with central compartment lymph node dissection  Anesthesia:  General  Estimated Blood Loss:  minimal  Drains: none         Specimen: thyroid to pathology  Indications:  Patient is referred by Dr. Philemon Kingdom for surgical evaluation and management of newly diagnosed papillary thyroid carcinoma. Patient has a history of thyroid disease including hyperthyroidism and thyroid nodules. She has been followed at least since 2015 when she has a documented ultrasound examination. Patient has been treated with methimazole for hyperthyroidism. She currently takes 5 mg twice daily. Her most recent TSH level was undetectable. Patient underwent an ultrasound examination on September 30, 2021. This demonstrated bilateral nodules. The nodule in the left mid thyroid lobe measuring 1.2 cm was felt to be highly suspicious and biopsy was recommended. Cytopathology demonstrated evidence of papillary thyroid carcinoma. Patient is now referred by her endocrinologist for surgical evaluation for total thyroidectomy for management of newly diagnosed carcinoma and hyperthyroidism. Patient has had no prior head or neck surgery. She works at [employer].  Procedure Details: Procedure was done in OR #1 at the [facility]. The patient was brought to the operating room and placed in a supine position on the operating room table. Following administration of general anesthesia, the patient was positioned and then prepped and draped in the usual aseptic fashion. After ascertaining that an adequate level of anesthesia had been achieved, a small Kocher incision was made with #15 blade. Dissection was carried through subcutaneous tissues and platysma.Hemostasis was achieved with  the electrocautery. Skin flaps were elevated cephalad and caudad from the thyroid notch to the sternal notch. A Mahorner self-retaining retractor was placed for exposure. Strap muscles were incised in the midline and dissection was begun on the left side.  Strap muscles were reflected laterally.  Left thyroid lobe was normal in size with nodules.  The left lobe was gently mobilized with blunt dissection. Superior pole vessels were dissected out and divided individually between small and medium ligaclips with the harmonic scalpel. The thyroid lobe was rolled anteriorly. Branches of the inferior thyroid artery were divided between small ligaclips with the harmonic scalpel. Inferior venous tributaries were divided between ligaclips. Both the superior and inferior parathyroid glands were identified and preserved on their vascular pedicles. The recurrent laryngeal nerve was identified and preserved along its course. The ligament of Gwenlyn Found was released with the electrocautery and the gland was mobilized onto the anterior trachea. Isthmus was mobilized across the midline. There was a small pyramidal lobe present which was resected with the isthmus. Dry pack was placed in the left neck.  The right thyroid lobe was gently mobilized with blunt dissection. Right thyroid lobe was moderately enlarged with cystic masses in the inferior pole. Superior pole vessels were dissected out and divided between small and medium ligaclips with the Harmonic scalpel. Superior parathyroid was identified and preserved. Inferior venous tributaries were divided between medium ligaclips with the harmonic scalpel. The right thyroid lobe was rolled anteriorly and the branches of the inferior thyroid artery divided between small ligaclips. The right recurrent laryngeal nerve was identified and preserved along its course. The ligament of Gwenlyn Found was released with the electrocautery. The right thyroid lobe was mobilized onto the anterior trachea and the  remainder of the thyroid was dissected off  the anterior trachea and the thyroid was completely excised. A suture was used to mark the left lobe. The entire thyroid gland was submitted to pathology for review.  Tissue from the central compartment was excised using the electrocautery and small clips for hemostatsis.  Tissue was taken between the carotid arteries and anterior to the trachea down to the thymic remnant.  Tissue was submitted separately to pathology labeled as central compartment lymph nodes.  The neck was irrigated with warm saline. Fibrillar was placed throughout the operative field. Strap muscles were approximated in the midline with interrupted 3-0 Vicryl sutures. Platysma was closed with interrupted 3-0 Vicryl sutures. Skin was closed with a running 4-0 Monocryl subcuticular suture. Wound was washed and Dermabond was applied. The patient was awakened from anesthesia and brought to the recovery room. The patient tolerated the procedure well.   Armandina Gemma, MD [facility] Office: [phone]

## 2022-01-05 ENCOUNTER — Encounter (HOSPITAL_COMMUNITY): Payer: Self-pay | Admitting: Surgery

## 2022-01-05 DIAGNOSIS — C73 Malignant neoplasm of thyroid gland: Secondary | ICD-10-CM | POA: Diagnosis not present

## 2022-01-05 LAB — SURGICAL PATHOLOGY

## 2022-01-05 LAB — BASIC METABOLIC PANEL
Anion gap: 6 (ref 5–15)
BUN: 12 mg/dL (ref 8–23)
CO2: 27 mmol/L (ref 22–32)
Calcium: 10.3 mg/dL (ref 8.9–10.3)
Chloride: 97 mmol/L — ABNORMAL LOW (ref 98–111)
Creatinine, Ser: 0.83 mg/dL (ref 0.44–1.00)
GFR, Estimated: >60 mL/min (ref >60)
Glucose, Bld: 128 mg/dL — ABNORMAL HIGH (ref 70–99)
Potassium: 4.7 mmol/L (ref 3.5–5.1)
Sodium: 130 mmol/L — ABNORMAL LOW (ref 135–145)

## 2022-01-05 MED ORDER — LEVOTHYROXINE SODIUM 100 MCG PO TABS: 100.0000 ug | ORAL_TABLET | Freq: Every day | ORAL | 2 refills | Status: DC

## 2022-01-05 MED ORDER — CALCIUM CARBONATE ANTACID 500 MG PO CHEW: 2.0000 | CHEWABLE_TABLET | Freq: Two times a day (BID) | ORAL | 1 refills | Status: DC

## 2022-01-05 NOTE — Discharge Summary (Signed)
Physician Discharge Summary   Patient ID: Inaaya Vellucci MRN: [id number] DOB/AGE: 18-Jul-1951 71 y.o.  Admit date: 01/04/2022  Discharge date: 01/05/2022  Discharge Diagnoses:  Principal Problem:   Papillary thyroid carcinoma [facility]) Active Problems:   Multiple thyroid nodules   Hyperthyroidism   Discharged Condition: good  Hospital Course: Patient was admitted for observation following thyroid surgery.  Post op course was uncomplicated.  Pain was well controlled.  Tolerated diet.  Post op calcium level on morning following surgery was 10.3 mg/dl.  Patient was prepared for discharge home on POD#1.  Consults: None  Treatments: surgery: total thyroidectomy with limited lymph node dissection  Discharge Exam: Blood pressure 133/65, pulse 65, temperature 98.1 F (36.7 C), temperature source Oral, resp. rate 16, height 5\' 3"  (1.6 m), weight 59.6 kg, SpO2 97 %. HEENT - clear Neck - wound dry and intact; mild hoarseness; minimal soft tissue swelling; Dermabond in place  Disposition: Home  Discharge Instructions     Diet - low sodium heart healthy   Complete by: As directed    Increase activity slowly   Complete by: As directed    No dressing needed   Complete by: As directed       Allergies as of 01/05/2022       Reactions   Darvon [propoxyphene Hcl] Other (See Comments)   "climb the walls"   Vicodin [hydrocodone-acetaminophen] Other (See Comments)   "climb the walls"        Medication List     TAKE these medications    acetaminophen 325 MG tablet Commonly known as: TYLENOL Take 2 tablets (650 mg total) by mouth every 6 (six) hours as needed for mild pain (or Fever >/= 101).   alendronate 70 MG tablet Commonly known as: FOSAMAX Take 1 tablet (70 mg total) by mouth every 7 (seven) days. Take with a full glass of water on an empty stomach.   amLODipine 5 MG tablet Commonly known as: NORVASC Take 5 mg by mouth daily.   FISH OIL PO Take 1 capsule by mouth  daily.   levothyroxine 100 MCG tablet Commonly known as: Synthroid Take 1 tablet (100 mcg total) by mouth daily.   multivitamin with minerals Tabs tablet Take 1 tablet by mouth daily.   pantoprazole 40 MG tablet Commonly known as: PROTONIX TAKE 1 TABLET (40 MG TOTAL) BY MOUTH 2 (TWO) TIMES DAILY BEFORE A MEAL.   SUMAtriptan 100 MG tablet Commonly known as: IMITREX Take 1 as needed for migraineMay repeat in 2 hours if headache persists or recurs.   TUMS ULTRA 1000 PO Take 1,000 mg by mouth daily. What changed: Another medication with the same name was added. Make sure you understand how and when to take each.   calcium carbonate 500 MG chewable tablet Commonly known as: Tums Chew 2 tablets (400 mg of elemental calcium total) by mouth 2 (two) times daily. What changed: You were already taking a medication with the same name, and this prescription was added. Make sure you understand how and when to take each.               Discharge Care Instructions  (From admission, onward)           Start     Ordered   01/05/22 0000  No dressing needed        01/05/22 2751            Follow-up Information     Armandina Gemma, MD. Schedule an  appointment as soon as possible for a visit in 3 week(s).   Specialty: General Surgery Why: For wound re-check Contact information: [place] [address] [phone]                 [facility], [facility] Office: [phone]   Signed: Armandina Gemma 01/05/2022, 7:34 AM

## 2022-01-05 NOTE — Progress Notes (Signed)
Path shows multifocal papillary thyroid carcinoma with a microscopic focus of extrathyroidal extension.  Nodes are negative which is good.  Dr. Cruzita Lederer will review and help decide whether RAI is indicated.  [place], MD [facility] [place] practice Office: [phone]

## 2022-01-05 NOTE — Progress Notes (Signed)
Transition of Care [facility]) Screening Note  Patient Details  Name: Carol King Date of Birth: October 03, 1951  Transition of Care [facility]) CM/SW Contact:    Sherie Don, LCSW Phone Number: 01/05/2022, 8:42 AM  Transition of Care Department [facility]) has reviewed patient and no TOC needs have been identified at this time. We will continue to monitor patient advancement through interdisciplinary progression rounds. If new patient transition needs arise, please place a TOC consult.

## 2022-01-05 NOTE — Discharge Instructions (Signed)
[facility] - Dr. Braidyn Scorsone  THYROID & PARATHYROID SURGERY:  POST-OP INSTRUCTIONS  Always review the instruction sheet provided by the hospital nurse at discharge.  A prescription for pain medication may be sent to your pharmacy at the time of discharge.  Take your pain medication as prescribed.  If narcotic pain medicine is not needed, then you may take acetaminophen (Tylenol) or ibuprofen (Advil) as needed for pain or soreness.  Take your normal home medications as prescribed unless otherwise directed.  If you need a refill on your pain medication, please contact the office during regular business hours.  Prescriptions will not be processed by the office after 5:00PM or on weekends.  Start with a light diet upon arrival home, such as soup and crackers or toast.  Be sure to drink plenty of fluids.  Resume your normal diet the day after surgery.  Most patients will experience some swelling and bruising on the chest and neck area.  Ice packs will help for the first 48 hours after arriving home.  Swelling and bruising will take several days to resolve.   It is common to experience some constipation after surgery.  Increasing fluid intake and taking a stool softener (Colace) will usually help to prevent this problem.  A mild laxative (Milk of Magnesia or Miralax) should be taken according to package directions if there has been no bowel movement after 48 hours.  Dermabond glue covers your incision. This seals the wound and you may shower at any time. The Dermabond will remain in place for about a week.  You may gradually remove the glue when it loosens around the edges.  If you need to loosen the Dermabond for removal, apply a layer of Vaseline to the wound for 15 minutes and then remove with a Kleenex. Your sutures are under the skin and will not show - they will dissolve on their own.  You may resume light daily activities beginning the day after discharge (such as self-care,  walking, climbing stairs), gradually increasing activities as tolerated. You may have sexual intercourse when it is comfortable. Refrain from any heavy lifting or straining until approved by your doctor. You may drive when you no longer are taking prescription pain medication, you can comfortably wear a seatbelt, and you can safely maneuver your car and apply the brakes.  You will see your doctor in the office for a follow-up appointment approximately three weeks after your surgery.  Make sure that you call for this appointment within a day or two after you arrive home to insure a convenient appointment time. Please have any requested laboratory tests performed a few days prior to your office visit so that the results will be available at your follow up appointment.  WHEN TO CALL THE [facility] OFFICE: -- Fever greater than 101.5 -- Inability to urinate -- Nausea and/or vomiting - persistent -- Extreme swelling or bruising -- Continued bleeding from incision -- Increased pain, redness, or drainage from the incision -- Difficulty swallowing or breathing -- Muscle cramping or spasms -- Numbness or tingling in hands or around lips  The clinic staff is available to answer your questions during regular business hours.  Please don't hesitate to call and ask to speak to one of the nurses if you have concerns.  [facility] OFFICE: [phone] (24 hours)  Please sign up for MyChart accounts. This will allow you to communicate directly with my nurse or myself without having to call the office. It will also allow you   to view your test results. You will need to enroll in MyChart for my office ([facility]) and for the hospital ([facility]).  Serra Younan, MD [facility] practice 

## 2022-01-20 DIAGNOSIS — Z9089 Acquired absence of other organs: Secondary | ICD-10-CM | POA: Insufficient documentation

## 2022-01-20 DIAGNOSIS — E890 Postprocedural hypothyroidism: Secondary | ICD-10-CM | POA: Insufficient documentation

## 2022-02-04 ENCOUNTER — Encounter: Payer: Self-pay | Admitting: Internal Medicine

## 2022-02-04 ENCOUNTER — Ambulatory Visit (INDEPENDENT_AMBULATORY_CARE_PROVIDER_SITE_OTHER): Payer: BC Managed Care – PPO | Admitting: Internal Medicine

## 2022-02-04 ENCOUNTER — Other Ambulatory Visit: Payer: Self-pay

## 2022-02-04 VITALS — BP 148/88 | HR 77 | Ht 63.0 in | Wt 134.2 lb

## 2022-02-04 DIAGNOSIS — E890 Postprocedural hypothyroidism: Secondary | ICD-10-CM

## 2022-02-04 DIAGNOSIS — E041 Nontoxic single thyroid nodule: Secondary | ICD-10-CM

## 2022-02-04 DIAGNOSIS — C73 Malignant neoplasm of thyroid gland: Secondary | ICD-10-CM

## 2022-02-04 DIAGNOSIS — E0500 Thyrotoxicosis with diffuse goiter without thyrotoxic crisis or storm: Secondary | ICD-10-CM | POA: Diagnosis not present

## 2022-02-04 LAB — TSH: TSH: 0.04 u[IU]/mL — ABNORMAL LOW (ref 0.35–5.50)

## 2022-02-04 LAB — CALCIUM: Calcium: 9.7 mg/dL (ref 8.4–10.5)

## 2022-02-04 LAB — VITAMIN D 25 HYDROXY (VIT D DEFICIENCY, FRACTURES): VITD: 22.25 ng/mL — ABNORMAL LOW (ref 30.00–100.00)

## 2022-02-04 LAB — T4, FREE: Free T4: 1.27 ng/dL (ref 0.60–1.60)

## 2022-02-04 MED ORDER — LEVOTHYROXINE SODIUM 88 MCG PO TABS: 88.0000 ug | ORAL_TABLET | Freq: Every day | ORAL | 3 refills | Status: DC

## 2022-02-04 NOTE — Patient Instructions (Addendum)
Please continue Levothyroxine 100 mcg daily.  Take the thyroid hormone every day, with water, at least 30 minutes before breakfast, separated by at least 4 hours from: - acid reflux medications - calcium - iron - multivitamins  Please stop at the lab.  Please return in 6 months.  Radioactive Iodine Treatment for Thyroid Cancer Radioactive iodine treatment is a treatment for thyroid cancer. The treatment involves swallowing a pill or liquid that contains a substance called radioactive iodine (I-131), or radioiodine. The substance is absorbed by the thyroid gland and destroys cancerous tissue. This treatment may be used to kill cancer cells that remain after surgery to remove the cancer. It may also be done for thyroid cancer that has spread or has come back after treatments. Tell a health care provider about: Any allergies you have. All medicines you are taking, including vitamins, herbs, eye drops, creams, and over-the-counter medicines. Any surgeries you have had. Any medical conditions you have. Any blood disorders you have. Whether you are pregnant, may be pregnant, or have gone through menopause, if this applies. Whether you are breastfeeding, if this applies. Any contact you have with children or pregnant women. Your travel plans for the next 3 months. Whether you pass through radiation detectors for work or travel. What are the risks? Generally, this is a safe procedure. However, problems may occur, including: Damage to other structures or organs, such as the salivary glands. This could lead to dry mouth and loss of taste. Increased risk for leukemia or other cancers. Lower sperm count or infertility in men. Irregular menstrual periods in women. What happens before the procedure? Eating and drinking restrictions Follow instructions from your health care provider about eating or drinking restrictions. Follow a low-iodine diet as told by your health care provider. Check  ingredients on packaged foods and beverages because there are foods that you will need to avoid while on the low-iodine diet: Avoid iodized table salt and foods that have iodized salt. Avoid seafood, seaweed, soybeans, and soy products. Avoid dairy products and eggs. Avoid the food dye Red No. 3 because it has iodine. Tests and exams See your health care provider for any needed tests and exams. Before your treatment: You will have tests to check your thyroid hormone levels. You will take a small dose of radioactive iodine and have a body scan. The dose will be much smaller than your normal treatment dose. The body scan will show if your body is absorbing the iodine. You may get injections of thyroid-stimulating hormone to help your body absorb iodine. General instructions Ask your health care provider about changing or stopping your regular medicines. You may have to stop taking your thyroid hormone medicine or other medicines. Plan to drive yourself home after treatment with no one else in the car. Do not take public transportation. If you need someone to drive you home, sit as far away from the driver as possible. If you are breastfeeding, ask your health care provider when to stop. Radioactive iodine can pass into your milk. What happens during the procedure? You will have a body scan to check for cancerous cells in your thyroid. You may be given medicine to prevent nausea or vomiting. You will go to an isolated room with lead-lined walls. The room will protect others from the radioactive treatment that you will receive. You will take the radioactive iodine in liquid or pill form. You may have water to swallow the pills. You will have to stay in the isolated room  with lead-lined walls for at least 2 hours. What happens after the procedure? You will have a body scan to check if the radioactive iodine is being absorbed by your thyroid. Do not eat or drink for 1-2 hours after the procedure as  told by your health care provider. Your health care provider will discuss with you whether you need to stay in the hospital a few days. Follow instructions from your health care provider about avoiding contact with other people to protect them from exposure to radiation. Summary Radioactive iodine treatment is a treatment for thyroid cancer. You will have to stay in the isolated room with lead-lined walls for at least 2 hours after taking the radioactive iodine. Your health care provider will discuss with you whether you need to stay in the hospital a few days. Do not eat or drink for 1-2 hours after the procedure as told by your health care provider. Plan to drive yourself home after treatment with no one else in the car. Do not take public transportation. If you need someone to drive you home, sit as far away from the driver as possible. Follow instructions from your health care provider about avoiding contact with other people after the treatment. This information is not intended to replace advice given to you by your health care provider. Make sure you discuss any questions you have with your health care provider. Document Revised: 06/04/2019 Document Reviewed: 06/04/2019 Elsevier Patient Education  [place].  Radioactive Iodine Treatment for Thyroid Cancer, Care After This sheet gives you information about how to care for yourself after your procedure. Your health care provider may also give you more specific instructions. If you have problems or questions, contact your health care provider. What can I expect after the procedure? After the procedure, your body and all of your body fluids--such as sweat, urine, stool, and saliva--will be radioactive. It is common to have: Pain and swelling in the glands that make saliva (salivary glands). Dry mouth and less saliva. Changes in taste. Pain and swelling in the mouth and cheeks. Mild nausea. Swelling or pain in the neck. Follow these  instructions at home: Eating and drinking  Do not eat or drink for 1-2 hours after the procedure. Do notshare food or drinks with other people for as long as told by your health care provider. Do not share utensils--such as silverware, plates, or cups--for as long as told by your health care provider. Use disposable utensils, or clean your utensils separately from those of others. Chew gum or suck on hard candy if your mouth is dry. Follow the diet that your health care provider recommends. Drink enough fluid to keep your urine pale yellow. Lifestyle Avoid close contact with other people for as long as told by your health care provider. The radiation in your body can affect others. Avoiding contact with children and pregnant women is especially important because they are more sensitive to radiation. For as long as told by your health care provider: Sleep alone. Sleep in a separate bed if you normally share your bed with another person. Flush twice after using the toilet. Clean up any spills of urine or stool in the bathroom right away. Avoid using public bathrooms. Wash your sheets, towels, and clothes separately from other people's items. Do not have close intimate contact of any kind. This includes kissing, physical contact, and sexual intercourse. Stay home from work or school as told by your health care provider. Avoid using public transportation or riding  in cars with other people. Pregnancy and breastfeeding Do not try to become pregnant for as long as you are told by your health care provider. This may be for up to 1 year after your procedure. Use a method of birth control (contraception) to prevent pregnancy. Talk to your health care provider about what form of contraception is right for you. Do not breastfeed for as long as you are told by your health care provider, if this applies. General instructions Take over-the-counter and prescription medicines only as told by your health care  provider. Keep all follow-up visits as told by your health care provider. This is important. Contact a health care provider if: You have severe nausea and vomiting. You are having problems eating or drinking. You are constipated or have diarrhea. Get help right away if: You have trouble breathing. Your salivary glands are very painful and swollen. Your saliva smells or tastes bad. Summary After the procedure, it is common to have a dry mouth, mild nausea, and some pain and swelling in the salivary glands, the neck, or the mouth and cheeks. Do notshare your utensils, food, or drinks with other people for as long as told by your health care provider. Avoid being around other people for as long as told by your health care provider. The radiation in your body can affect others. Avoiding contact with children and pregnant women is especially important. Drink enough fluid to keep your urine pale yellow. This information is not intended to replace advice given to you by your health care provider. Make sure you discuss any questions you have with your health care provider. Document Revised: 06/04/2019 Document Reviewed: 06/04/2019 Elsevier Patient Education  [place].

## 2022-02-04 NOTE — Progress Notes (Addendum)
Patient ID: Carol King, female   DOB: 16-Jul-1951, 71 y.o.   MRN: [id number]  This visit occurred during the SARS-CoV-2 public health emergency.  Safety protocols were in place, including screening questions prior to the visit, additional usage of staff PPE, and extensive cleaning of exam room while observing appropriate contact time as indicated for disinfecting solutions.   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  She was previously seen by Dr. Dwyane Dee once, in 09/2014.  Last visit 3 months ago.    Interim history: She feels well after her thyroid surgery (1 months ago), without complaints, other than mild fatigue and weight gain (3 pounds by our scale). She has mild pain on the sides of her cervical scar.  Reviewed and addended history: She has a long history of suppressed TSH, per my records, this was first documented in 2014.  Since then, she saw Dr. Dwyane Dee in 2015 and he checked a thyroid uptake and scan and a thyroid ultrasound.  She did not return to see him afterwards.  TFTs in 2017 and 2018 were worse.  She started to see me in 2019, and we have been adjusting her methimazole dose, but she was lost for follow-up with me, also, after her visit in 08/2018.  She now returns after a new referral was made due to persistently abnormal TFTs.  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  She lost a significant amount of weight in the past that she could not eat.  Weight improved afterwards.   03/2018: Methimazole was started at 5 mg twice a day 06/2018: Methimazole dose was decreased to 5 mg daily 08/2018: MMI dose was increased to 5 mg in a.m. and 2.5 mg in p.m.  2020: Ran out [place]    09/2021: Reestablished care with me -restarted MMI 5 mg twice a day  01/04/22: Total thyroidectomy  She is now on LT4 100 mcg  daily: - in am - fasting - at least 30 min from b'fast - no calcium now - 250 mg at night - no iron - + multivitamins - x2 (total 500 mg) at night - no PPIs - not on Biotin  I reviewed patient's TFTs: Lab Results  Component Value Date   TSH 0.11 (L) 12/30/2021   TSH 0.00 (L) 10/27/2021   TSH <0.01 (L) 08/15/2018   TSH 0.02 (L) 06/20/2018   TSH <0.01 (L) 05/10/2018   TSH <0.01 Repeated and verified X2. (L) 03/27/2018   TSH <0.006 (L) 06/10/2017   TSH 0.03 (L) 06/08/2016   TSH 0.011 (L) 07/15/2014   TSH 0.288 (L) 04/19/2013   FREET4 0.99 12/30/2021   FREET4 1.19 10/27/2021   FREET4 1.28 08/15/2018   FREET4 0.55 (L) 06/20/2018   FREET4 1.13 05/10/2018   FREET4 1.94 (H) 03/27/2018   FREET4 3.51 (H) 06/10/2017   FREET4 1.9 (H) 06/08/2016   FREET4 1.17 09/18/2014   T3FREE 3.5 12/30/2021   T3FREE 3.0 10/27/2021   T3FREE 5.2 (H) 08/15/2018   T3FREE 2.6 06/20/2018   T3FREE 3.1 05/10/2018   T3FREE 4.9 (H) 03/27/2018   T3FREE 4.0 06/08/2016   T3FREE 2.6 09/18/2014  09/24/2021: TSH <0.005, free T4 2.02,  TT3  122 (71-180) 09/30/2020: TSH <0.005, free T4 2.47 (0.82-1.77), WBC normal, 6.7 07/06/2019: TSH <0.006,  normal total T3 and total T4  Her Graves'  antibodies were elevated: Lab Results  Component Value Date   TSI 427 (H) 03/27/2018    Thyroid uptake and scan (10/08/2014): Uptake 9.9%, consistent with subacute thyroiditis.  Possible cold nodule in the lower right lobe.  Thyroid ultrasound (10/31/2014): 1.8 cm right thyroid nodule, corresponding to the cold nodule on the thyroid scan. She refused a thyroid biopsy at that time.  Thyroid U/S (09/30/2021): Parenchymal Echotexture: Mildly heterogeneous Isthmus: 0.2 cm Right lobe: 5.5 x 2.2 x 2.1 cm Left lobe: 4.1 x 1.5 x 1.6 cm _________________________________________________________   Nodule 1: 3.6 x 3.1 x 2.2 cm cystic nodule in the inferior right thyroid lobe does not meet criteria for FNA or imaging follow-up.  It has increased in size since prior examination, however predominately due to accumulation of fluid.   _________________________________________________________   Nodule 2: 1.0 x 0.7 x 0.7 cm spongiform nodule in the superior left thyroid lobe does not meet criteria for FNA or imaging surveillance. _________________________________________________________   Nodule # 3: Location: Left; mid Maximum size: 1.2 cm; Other 2 dimensions: 0.8 x 0.7 cm Composition: solid/almost completely solid (2) Echogenicity: hypoechoic (2) Echogenic foci: punctate echogenic foci (3)   **Given size (>/= 1.0 cm) and appearance, fine needle aspiration of this highly suspicious nodule should be considered based on TI-RADS criteria.    _________________________________________________________   IMPRESSION: Nodule 3 (TI-RADS 5) located in the mid left thyroid lobe meets criteria for FNA.  FNA of the left mid thyroid nodule (10/27/2021): THYROID, LEFT MID, FINE NEEDLE ASPIRATION:   FINAL MICROSCOPIC DIAGNOSIS:  - Findings consistent with papillary carcinoma (Bethesda category VI)  - See comment   SPECIMEN ADEQUACY:  Satisfactory for evaluation   Total thyroidectomy (01/04/2022) by Dr. Harlow Asa:  A. THYROID, TOTAL THYROIDECTOMY:  - Multifocal papillary thyroid carcinoma, 0.6 and 0.5 cm.  - Microscopic focus of extrathyroidal extension.  - Margins negative for carcinoma.  - Adenomatous nodules, partially cystic.  - See oncology table.   B. LYMPH NODES, CENTRAL COMPARTMENT, RESECTION:  - Six lymph nodes negative for metastatic carcinoma (0/6).   ONCOLOGY TABLE:  THYROID GLAND, CARCINOMA: Resection  Procedure: Total thyroidectomy and central compartment lymph node  excision.  Tumor Focality: Multifocal, 2 tumors.  Tumor Site: Left inferior and left mid lobe.  Tumor Size: 0.6 cm (left inferior lobe) and 0.5 cm (left mid lobe).  Histologic Type: Papillary thyroid carcinoma, classic type.   Angioinvasion: Not identified.  Lymphatic Invasion: Not identified.  Extrathyroidal Extension: Present, microscopic (left mid lobe tumor).  Margin Status: All surgical margins negative for carcinoma.  Regional Lymph Node Status: Yes      Number of Lymph Nodes with Tumor: 0       Nodal Level(s) Involved: 0       Number of Lymph Nodes Examined: 6       Nodal Level(s) Examined: 1  Pathologic Stage Classification (pTNM, AJCC 8th Edition): pT1a (multifocal), pN0  Representative Tumor Block: A5 (left inferior lobe) and A4 (left middle lobe).  Comment(s): There is a 0.6 cm papillary carcinoma in the left inferior  lobe and a 0.5 cm papillary carcinoma in the left mid lobe.  There is a  microscopic focus of extrathyroidal extension adjacent to the middle  lobe tumor.  The margins are negative for carcinoma.  There are  adenomatous nodules in the right and left lobes and the right lobe  adenomatous nodule is partially cystic.   Pt denies: - dysphagia - choking - SOB with lying down She has some hoarseness.  No FH of thyroid cancer. No h/o radiation tx to head or neck.  She does have family history of hypothyroidism.  No herbal supplements. No Biotin use. No recent steroids use.   After surgery, calcium remains normal: 01/15/2022: Calcium 9.4 Lab Results  Component Value Date   CALCIUM 10.3 01/05/2022   CALCIUM 10.1 12/24/2021   CALCIUM 9.5 03/09/2018   CALCIUM 9.0 03/04/2018   CALCIUM 8.5 (L) 03/03/2018   CALCIUM 9.2 03/01/2018   CALCIUM 9.7 06/10/2017   CALCIUM 9.4 06/08/2016   CALCIUM 8.9 05/07/2015   CALCIUM 9.8 05/01/2015   Lab Results  Component Value Date   VD25OH 41 04/10/2012   She also has a history of osteoporosis per DXA scan from 09/2017.  She had a stress fracture in 01/2018 which evolved to a complete fracture. After her fracture, she used a lot of NSAIDs and she developed stomach and duodenal ulcers and actually developed a duodenal stricture.  She is on  Fosamax.  ROS: + See HPI + tinnitus, + hypoacusis + HAs (since she was a child)  I reviewed patient medications, allergies, past medical history, social history, family history and changes were documented in the history of present illness.  Past Medical History:  Diagnosis Date   Arthritis    Complication of anesthesia    Difficulty sleeping    DUE TO HIP PAIN   Headache    MIGRAINES   History of skin cancer    Hypertension    PONV (postoperative nausea and vomiting)    SEVERAL YRS AGO - NONE WITH MORE RECENT SURGERIES   Skin cancer    BCC, SCC   Past Surgical History:  Procedure Laterality Date   ABDOMINAL HYSTERECTOMY  1981   one ovary remains   APPENDECTOMY     ESOPHAGOGASTRODUODENOSCOPY (EGD) WITH PROPOFOL N/A 03/02/2018   Procedure: ESOPHAGOGASTRODUODENOSCOPY (EGD) WITH PROPOFOL;  Surgeon: Doran Stabler, MD;  Location: [facility];  Service: Gastroenterology;  Laterality: N/A;   ESOPHAGOGASTRODUODENOSCOPY (EGD) WITH PROPOFOL N/A 03/21/2018   Procedure: ESOPHAGOGASTRODUODENOSCOPY (EGD) WITH PROPOFOL;  Surgeon: Doran Stabler, MD;  Location: [facility];  Service: Gastroenterology;  Laterality: N/A;   LYMPH NODE DISSECTION N/A 01/04/2022   Procedure: LIMITED LYMPH NODE DISSECTION;  Surgeon: Armandina Gemma, MD;  Location: [facility];  Service: General;  Laterality: N/A;   SKIN CANCER EXCISION     THYROIDECTOMY N/A 01/04/2022   Procedure: TOTAL THYROIDECTOMY;  Surgeon: Armandina Gemma, MD;  Location: [facility];  Service: General;  Laterality: N/A;   TONSILLECTOMY AND ADENOIDECTOMY     TOTAL HIP ARTHROPLASTY Right 07/11/2011   HIP; [facility]   TOTAL HIP ARTHROPLASTY Left 05/06/2015   Procedure: LEFT TOTAL HIP ARTHROPLASTY ANTERIOR APPROACH;  Surgeon: Paralee Cancel, MD;  Location: [facility];  Service: Orthopedics;  Laterality: Left;   Social History   Socioeconomic History   Marital status: Single    Spouse name: n/a   Number of children: 0   Years of education: college   Highest  education level: Not on file  Occupational History   Occupation: Aeronautical engineer: [employer]   Occupation: IT consultant: [employer]  Tobacco Use   Smoking status: Former    Types: Cigarettes    Quit date: 04/30/1994    Years since quitting: 27.7   Smokeless tobacco: Former    Quit date: 03/09/1993  Vaping Use   Vaping Use: Never used  Substance and Sexual Activity  Alcohol use: No    Alcohol/week: 0.0 standard drinks   Drug use: No   Sexual activity: Never  Other Topics Concern   Not on file  Social History Narrative   Home Environment: Lives at home alone with her two dogs. One level home with a few stairs to the front and back doors.   Diet: Stays clear of carbohydrates and chocolate because they trigger headaches. Likes meat/chicken and vegetables. Generally healthy.   Exercise: Stays busy and active at work. Takes the stairs instead of the elevator.   Sleep: Significantly improved since hip replacement surgeries. Goes to bed around 9 and wakes up at 6. Able to work full days.   Work: Still enjoys working at [employer].   Urinary problems: No concerns. Hydrates well.      Brother lives in [place], [place]   Social Determinants of Health   Financial Resource Strain: Not on file  Food Insecurity: Not on file  Transportation Needs: Not on file  Physical Activity: Not on file  Stress: Not on file  Social Connections: Not on file  Intimate Partner Violence: Not on file   Current Outpatient Medications on File Prior to Visit  Medication Sig Dispense Refill   acetaminophen (TYLENOL) 325 MG tablet Take 2 tablets (650 mg total) by mouth every 6 (six) hours as needed for mild pain (or Fever >/= 101).     alendronate (FOSAMAX) 70 MG tablet Take 1 tablet (70 mg total) by mouth every 7 (seven) days. Take with a full glass of water on an empty stomach. 12 tablet 3   amLODipine (NORVASC) 5 MG tablet Take 5 mg by mouth daily.      calcium carbonate (TUMS) 500 MG chewable tablet Chew 2 tablets (400 mg of elemental calcium total) by mouth 2 (two) times daily. 90 tablet 1   Calcium Carbonate Antacid (TUMS ULTRA 1000 PO) Take 1,000 mg by mouth daily.     levothyroxine (SYNTHROID) 100 MCG tablet Take 1 tablet (100 mcg total) by mouth daily. 30 tablet 2   Multiple Vitamin (MULTIVITAMIN WITH MINERALS) TABS tablet Take 1 tablet by mouth daily.     Omega-3 Fatty Acids (FISH OIL PO) Take 1 capsule by mouth daily.     pantoprazole (PROTONIX) 40 MG tablet TAKE 1 TABLET (40 MG TOTAL) BY MOUTH 2 (TWO) TIMES DAILY BEFORE A MEAL. 60 tablet 0   SUMAtriptan (IMITREX) 100 MG tablet Take 1 as needed for migraineMay repeat in 2 hours if headache persists or recurs. 9 tablet 12   No current facility-administered medications on file prior to visit.   Allergies  Allergen Reactions   Darvon [Propoxyphene Hcl] Other (See Comments)    "climb the walls"   Vicodin [Hydrocodone-Acetaminophen] Other (See Comments)    "climb the walls"   Family History  Problem Relation Age of Onset   Cancer Mother 29       breast cancer   Cancer Maternal Grandmother        breast cancer   Kidney disease Maternal Grandfather    Arthritis Paternal Grandmother     PE: BP (!) 148/88 (BP Location: Right Arm, Patient Position: Sitting, Cuff Size: Normal)    Pulse 77    Ht '5\' 3"'  (1.6 m)    Wt 134 lb 3.2 oz (60.9 kg)    SpO2 98%    BMI 23.77 kg/m  Wt Readings from Last 3 Encounters:  02/04/22 134 lb 3.2 oz (60.9 kg)  01/04/22 131 lb 6.4 oz (59.6  kg)  12/30/21 131 lb 6.4 oz (59.6 kg)   Constitutional: normal weight, in NAD Eyes: PERRLA, EOMI, no exophthalmos ENT: moist mucous membranes, no neck masses palpated, thyroidectomy scar healing well, without erythema, fluctuance, or increased pain on palpation, no cervical lymphadenopathy Cardiovascular: RRR, No MRG Respiratory: CTA B Musculoskeletal: no deformities, strength intact in all 4 Skin: moist,  warm Neurological: no tremor with outstretched hands, DTR normal in all 4  ASSESSMENT: 1.  Graves' disease  2.  Thyroid nodules  3.  Papillary thyroid cancer  PLAN:  1. And 2. Patient with a long history of low TSH, with some thyrotoxic symptoms.  She did not have weight loss, palpitations, anxiety but had heat intolerance and increased sweating in the past, which reappeared before she reestablished care with me. -We started her on methimazole at 5 mg twice a day and she started to feel better. -She also has a history of thyroid nodules, of which 1 appeared to be cancerous on biopsy -Since last visit, she has total thyroidectomy 01/04/2022 by Dr. Harlow Asa  3 PTC -Patient has a history of a right thyroid nodule for which she refused further investigation in the past -However, after she reestablished care with me in 09/2021, we checked another thyroid ultrasound and, since this nodule was still suspicious, I again suggested biopsy.  This returns consistent with papillary thyroid cancer. -She had total thyroidectomy 1 month ago by Dr. Gennie Alma pathology showed papillary thyroid cancer -Her thyroidectomy scar is healing well -Calcium was normal after surgery (10.3 on 01/05/2022, and 9.4 on 01/15/2022).  She continues on calcium 1200 mg daily along with vitamin D3 and also a multivitamin.  We will recheck her calcium and vitamin D level today -At today's visit, we discussed that papillary thyroid cancer is usually a slow-growing cancer, with good prognosis.  Her quality of life and life expectancy is unlikely to be affected by the cancer. -At this visit, we discussed about proceeding with RAI treatment since her cancer had slight ETE (which puts her at the cusp between low intermediate risk).  Multifocality does not necessarily put her at a higher risk for recurrence.  She agrees with this, however, she prefers to do it later in the year.  I explained that this is not an emergency.  For now, we will  schedule her to come back in 6 months, but she will let me know if she prefers to have this done before then.  I gave her written information about RAI treatment and discussed about radiation safety afterwards -I would like to check a thyroglobulin + antithyroglobulin antibodies at next visit and we we will continue to follow this in the future to monitor trends.  I did advise her that RAI treatment is mostly done to follow thyroglobulin afterwards.  Explained why this is important. -Plan to check another neck ultrasound in a year from the surgery  4.  Postsurgical hypothyroidism - latest thyroid labs reviewed with pt. >> suppressed TSH, but this was checked before her surgery: Lab Results  Component Value Date   TSH 0.11 (L) 12/30/2021  - she continues on LT4 100 mcg daily, started in the hospital after surgery - pt feels good on this dose. - we discussed about taking the thyroid hormone every day, with water, >30 minutes before breakfast, separated by >4 hours from acid reflux medications, calcium, iron, multivitamins. Pt. is taking it correctly. - will check thyroid tests today: TSH and fT4 - If labs are abnormal, she will need  to return for repeat TFTs in 1.5 months -I will see her back in 6 months  - Total time spent for the visit: 40 min, in obtaining medical information from the chart, reviewing her  previous labs, surgical report, pathology results, imaging evaluations, and treatments, reviewing her symptoms, counseling her about her conditions (please see the discussed topics above), and developing a plan to further investigate and treat them; she had a number of questions which I addressed.  Needs refills.  Component     Latest Ref Rng & Units 02/04/2022  Calcium     8.4 - 10.5 mg/dL 9.7  TSH     0.35 - 5.50 uIU/mL 0.04 (L)  T4,Free(Direct)     0.60 - 1.60 ng/dL 1.27  VITD     30.00 - 100.00 ng/mL 22.25 (L)  Calcium level is normal, vitamin D level is low.  I will advise her to  start 1000 units vitamin D daily.  She can stay off calcium for now. TSH is suppressed.  We need to back off levothyroxine dose to 88 mcg daily and recheck the tests in 1.5 months.  Addendum: Patient contacted me after the visit about having the RAI treatment in 03/2022.  Philemon Kingdom, MD PhD [facility] Endocrinology

## 2022-02-05 NOTE — Addendum Note (Signed)
Addended by: Philemon Kingdom on: 02/05/2022 12:41 PM   Modules accepted: Orders

## 2022-02-10 NOTE — Written Directive (Addendum)
MOLECULAR IMAGING AND THERAPEUTICS WRITTEN DIRECTIVE ? ? ?PATIENT NAME: Carol King ? ?PT DOB:   [birth year] ?                                             ?MRN: [id number] ? ?--------------------------------------------------------------------------------------------------------------------- ? ? ?I-131 THYROID CANCER THERAPY ? ? ?RADIOPHARMACEUTICAL:  Iodine-131 Capsule  ? ? ?PRESCRIBED DOSE FOR ADMINISTRATION: 100 mCi  ? ? ?ROUTE OFADMINISTRATION:  PO ? ? ?DIAGNOSIS: Papillary Thyroid Carcinoma ? ? ?REFERRING PHYSICIAN: Dr. Cruzita Lederer ? ? ?THYROGEN STIMULATION OR HORMONE WITHDRAW: Thyrogen Stimulated ? ? ?REMNANT ABLATION OR ADJUVANT THERAPY: ? ? ?DATE OF THYROIDECTOMY: 01/20/22 ? ? ?SURGEON: Dr. Armandina Gemma ? ? ?TSH:   ?Lab Results  ?Component Value Date  ? TSH 0.04 (L) 02/04/2022  ? TSH 0.11 (L) 12/30/2021  ? TSH 0.00 (L) 10/27/2021  ? ? ? ?PRIOR I-131 THERAPY (Date and Dose): ? ? ?Pathology: ? ?Cell type: [x]   Papillary  []   Follicular  []   [place]  ? ?Largest tumor focus:     0.6 cm  (multifocal) ? ?Extrathyroidal Extension?     Yes [x]   No []    ? ?Lymphovascular Invasion?  Yes  []   No  [x]    ? ?Margins positive ? Yes []   No [x]    ? ?Lymph nodes positive? Yes []   No  [x]     ? ? ?# positive nodes:  0 ?# negative nodes:  6 ? ? ?TNM staging: pT:  1a       PN:   N0      Mx: ? ?  ?ADDITIONAL PHYSICIAN COMMENTS/NOTES - 71 yo female intermediate risk w/ extrathyroidal extension.  ? ? ?AUTHORIZED USER SIGNATURE & TIME STAMP: ?

## 2022-03-16 ENCOUNTER — Other Ambulatory Visit (INDEPENDENT_AMBULATORY_CARE_PROVIDER_SITE_OTHER): Payer: BC Managed Care – PPO

## 2022-03-16 DIAGNOSIS — E890 Postprocedural hypothyroidism: Secondary | ICD-10-CM

## 2022-03-16 LAB — T4, FREE: Free T4: 1.28 ng/dL (ref 0.60–1.60)

## 2022-03-16 LAB — TSH: TSH: 0.17 u[IU]/mL — ABNORMAL LOW (ref 0.35–5.50)

## 2022-03-17 ENCOUNTER — Other Ambulatory Visit (HOSPITAL_COMMUNITY): Payer: Self-pay

## 2022-03-22 ENCOUNTER — Ambulatory Visit (HOSPITAL_COMMUNITY)
Admission: RE | Admit: 2022-03-22 | Discharge: 2022-03-22 | Disposition: A | Discharge status: Home / Self Care | Payer: BC Managed Care – PPO | Source: Ambulatory Visit | Attending: Internal Medicine | Admitting: Internal Medicine

## 2022-03-22 DIAGNOSIS — C73 Malignant neoplasm of thyroid gland: Secondary | ICD-10-CM | POA: Diagnosis present

## 2022-03-22 MED ORDER — THYROTROPIN ALFA 0.9 MG IM SOLR: 0.9000 mg | INTRAMUSCULAR | Status: AC

## 2022-03-22 MED ORDER — THYROTROPIN ALFA 0.9 MG IM SOLR
INTRAMUSCULAR | Status: AC
  Administered 2022-03-22: 0.9 mg via INTRAMUSCULAR
  Filled 2022-03-22: qty 0.9

## 2022-03-23 ENCOUNTER — Ambulatory Visit (HOSPITAL_COMMUNITY)
Admission: RE | Admit: 2022-03-23 | Discharge: 2022-03-23 | Disposition: A | Discharge status: Home / Self Care | Payer: BC Managed Care – PPO | Source: Ambulatory Visit | Attending: Internal Medicine | Admitting: Internal Medicine

## 2022-03-23 DIAGNOSIS — C73 Malignant neoplasm of thyroid gland: Secondary | ICD-10-CM | POA: Diagnosis present

## 2022-03-23 DIAGNOSIS — Z510 Encounter for antineoplastic radiation therapy: Secondary | ICD-10-CM | POA: Diagnosis not present

## 2022-03-23 DIAGNOSIS — Z9089 Acquired absence of other organs: Secondary | ICD-10-CM | POA: Insufficient documentation

## 2022-03-23 MED ORDER — THYROTROPIN ALFA 0.9 MG IM SOLR
INTRAMUSCULAR | Status: AC
  Administered 2022-03-23: 0.9 mg via INTRAMUSCULAR
  Filled 2022-03-23: qty 0.9

## 2022-03-23 MED ORDER — THYROTROPIN ALFA 0.9 MG IM SOLR: 0.9000 mg | INTRAMUSCULAR | Status: AC

## 2022-03-24 ENCOUNTER — Ambulatory Visit (HOSPITAL_COMMUNITY)
Admission: RE | Admit: 2022-03-24 | Discharge: 2022-03-24 | Disposition: A | Discharge status: Home / Self Care | Payer: BC Managed Care – PPO | Source: Ambulatory Visit | Attending: Internal Medicine | Admitting: Internal Medicine

## 2022-03-24 MED ORDER — SODIUM IODIDE I 131 CAPSULE: 104.1000 | Freq: Once | INTRAVENOUS | Status: DC

## 2022-03-25 ENCOUNTER — Other Ambulatory Visit: Payer: Self-pay | Admitting: Internal Medicine

## 2022-03-25 DIAGNOSIS — E890 Postprocedural hypothyroidism: Secondary | ICD-10-CM

## 2022-03-25 MED ORDER — LEVOTHYROXINE SODIUM 75 MCG PO TABS: 75.0000 ug | ORAL_TABLET | Freq: Every day | ORAL | 3 refills | Status: DC

## 2022-04-01 ENCOUNTER — Ambulatory Visit (HOSPITAL_COMMUNITY)
Admission: RE | Admit: 2022-04-01 | Discharge: 2022-04-01 | Disposition: A | Discharge status: Home / Self Care | Payer: BC Managed Care – PPO | Source: Ambulatory Visit | Attending: Internal Medicine | Admitting: Internal Medicine

## 2022-04-01 ENCOUNTER — Encounter: Payer: Self-pay | Admitting: Internal Medicine

## 2022-04-01 DIAGNOSIS — C73 Malignant neoplasm of thyroid gland: Secondary | ICD-10-CM | POA: Diagnosis present

## 2022-05-06 ENCOUNTER — Other Ambulatory Visit (INDEPENDENT_AMBULATORY_CARE_PROVIDER_SITE_OTHER): Payer: BC Managed Care – PPO

## 2022-05-06 DIAGNOSIS — E890 Postprocedural hypothyroidism: Secondary | ICD-10-CM

## 2022-05-06 LAB — T4, FREE: Free T4: 0.93 ng/dL (ref 0.60–1.60)

## 2022-05-06 LAB — TSH: TSH: 0.77 u[IU]/mL (ref 0.35–5.50)

## 2022-07-16 ENCOUNTER — Encounter: Payer: Self-pay | Admitting: Internal Medicine

## 2022-07-16 ENCOUNTER — Ambulatory Visit (INDEPENDENT_AMBULATORY_CARE_PROVIDER_SITE_OTHER): Payer: BC Managed Care – PPO | Admitting: Internal Medicine

## 2022-07-16 VITALS — BP 148/92 | HR 64 | Ht 63.0 in | Wt 130.4 lb

## 2022-07-16 DIAGNOSIS — E559 Vitamin D deficiency, unspecified: Secondary | ICD-10-CM

## 2022-07-16 DIAGNOSIS — C73 Malignant neoplasm of thyroid gland: Secondary | ICD-10-CM | POA: Diagnosis not present

## 2022-07-16 DIAGNOSIS — E890 Postprocedural hypothyroidism: Secondary | ICD-10-CM | POA: Diagnosis not present

## 2022-07-16 LAB — TSH: TSH: 1 u[IU]/mL (ref 0.35–5.50)

## 2022-07-16 LAB — T4, FREE: Free T4: 1.04 ng/dL (ref 0.60–1.60)

## 2022-07-16 LAB — VITAMIN D 25 HYDROXY (VIT D DEFICIENCY, FRACTURES): VITD: 25.39 ng/mL — ABNORMAL LOW (ref 30.00–100.00)

## 2022-07-16 NOTE — Progress Notes (Signed)
Patient ID: Carol King, female   DOB: [birth year], 71 y.o.   MRN: [id number]  HPI  Carol King is a 71 y.o.-year-old female, returning for follow-up for Graves' disease and thyroid nodules, now with a new diagnosis of papillary thyroid cancer.  She was previously seen by Dr. Dwyane Dee once, in 09/2014.  Last visit 6 months ago.    Interim history: At this visit, she feels well, without complaints. Since last visit, she had RAI treatment (4 months ago).  Reviewed and addended history: She has a long history of suppressed TSH, per my records, this was first documented in 2014.  Since then, she saw Dr. Dwyane Dee in 2015 and he checked a thyroid uptake and scan and a thyroid ultrasound.  She did not return to see him afterwards.  TFTs in 2017 and 2018 were worse.  She started to see me in 2019, and we have been adjusting her methimazole dose, but she was lost for follow-up with me, also, after her visit in 08/2018.  She now returns after a new referral was made due to persistently abnormal TFTs.  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  She lost a significant amount of weight in the past that she could not eat.  Weight improved afterwards.   03/2018: Methimazole was started at 5 mg twice a day 06/2018: Methimazole dose was decreased to 5 mg daily 08/2018: MMI dose was increased to 5 mg in a.m. and 2.5 mg in p.m.  2020: Ran out [place]    09/2021: Reestablished care with me -restarted MMI 5 mg twice a day  01/04/22: Total thyroidectomy  She is now on LT4 75 mcg daily, decreased at last visit: - in am (6 am) - fasting - at least 30 min from b'fast - no calcium  - occas., later in the day - no iron - + multivitamins - x2 (total 500 mg) at night - no PPIs - not on Biotin  I reviewed patient's TFTs: Lab Results  Component Value Date   TSH 0.77 05/06/2022   TSH 0.17 (L) 03/16/2022   TSH 0.04 (L) 02/04/2022   TSH 0.11 (L) 12/30/2021    TSH 0.00 (L) 10/27/2021   TSH <0.01 (L) 08/15/2018   TSH 0.02 (L) 06/20/2018   TSH <0.01 (L) 05/10/2018   TSH <0.01 Repeated and verified X2. (L) 03/27/2018   TSH <0.006 (L) 06/10/2017   FREET4 0.93 05/06/2022   FREET4 1.28 03/16/2022   FREET4 1.27 02/04/2022   FREET4 0.99 12/30/2021   FREET4 1.19 10/27/2021   FREET4 1.28 08/15/2018   FREET4 0.55 (L) 06/20/2018   FREET4 1.13 05/10/2018   FREET4 1.94 (H) 03/27/2018   FREET4 3.51 (H) 06/10/2017   T3FREE 3.5 12/30/2021   T3FREE 3.0 10/27/2021   T3FREE 5.2 (H) 08/15/2018   T3FREE 2.6 06/20/2018   T3FREE 3.1 05/10/2018   T3FREE 4.9 (H) 03/27/2018   T3FREE 4.0 06/08/2016   T3FREE 2.6 09/18/2014  09/24/2021: TSH <0.005, free T4 2.02,  TT3  122 (71-180) 09/30/2020: TSH <0.005, free T4 2.47 (0.82-1.77), WBC normal, 6.7 07/06/2019: TSH <0.006,  normal total T3 and total T4  Her Graves' antibodies were elevated: Lab Results  Component Value Date   TSI 427 (H) 03/27/2018    Thyroid uptake and scan (10/08/2014): Uptake 9.9%, consistent with subacute thyroiditis.  Possible cold nodule in the lower right lobe.  Thyroid ultrasound (10/31/2014): 1.8 cm right thyroid nodule, corresponding to the cold nodule  on the thyroid scan. She refused a thyroid biopsy at that time.  Thyroid U/S (09/30/2021): Parenchymal Echotexture: Mildly heterogeneous Isthmus: 0.2 cm Right lobe: 5.5 x 2.2 x 2.1 cm Left lobe: 4.1 x 1.5 x 1.6 cm _________________________________________________________   Nodule 1: 3.6 x 3.1 x 2.2 cm cystic nodule in the inferior right thyroid lobe does not meet criteria for FNA or imaging follow-up. It has increased in size since prior examination, however predominately due to accumulation of fluid.   _________________________________________________________   Nodule 2: 1.0 x 0.7 x 0.7 cm spongiform nodule in the superior left thyroid lobe does not meet criteria for FNA or imaging  surveillance. _________________________________________________________   Nodule # 3: Location: Left; mid Maximum size: 1.2 cm; Other 2 dimensions: 0.8 x 0.7 cm Composition: solid/almost completely solid (2) Echogenicity: hypoechoic (2) Echogenic foci: punctate echogenic foci (3)   **Given size (>/= 1.0 cm) and appearance, fine needle aspiration of this highly suspicious nodule should be considered based on TI-RADS criteria.    _________________________________________________________   IMPRESSION: Nodule 3 (TI-RADS 5) located in the mid left thyroid lobe meets criteria for FNA.  FNA of the left mid thyroid nodule (10/27/2021): THYROID, LEFT MID, FINE NEEDLE ASPIRATION:   FINAL MICROSCOPIC DIAGNOSIS:  - Findings consistent with papillary carcinoma (Bethesda category VI)  - See comment   SPECIMEN ADEQUACY:  Satisfactory for evaluation   Total thyroidectomy (01/04/2022) by Dr. Harlow Asa:  A. THYROID, TOTAL THYROIDECTOMY:  - Multifocal papillary thyroid carcinoma, 0.6 and 0.5 cm.  - Microscopic focus of extrathyroidal extension.  - Margins negative for carcinoma.  - Adenomatous nodules, partially cystic.  - See oncology table.   B. LYMPH NODES, CENTRAL COMPARTMENT, RESECTION:  - Six lymph nodes negative for metastatic carcinoma (0/6).   ONCOLOGY TABLE:  THYROID GLAND, CARCINOMA: Resection  Procedure: Total thyroidectomy and central compartment lymph node  excision.  Tumor Focality: Multifocal, 2 tumors.  Tumor Site: Left inferior and left mid lobe.  Tumor Size: 0.6 cm (left inferior lobe) and 0.5 cm (left mid lobe).  Histologic Type: Papillary thyroid carcinoma, classic type.  Angioinvasion: Not identified.  Lymphatic Invasion: Not identified.  Extrathyroidal Extension: Present, microscopic (left mid lobe tumor).  Margin Status: All surgical margins negative for carcinoma.  Regional Lymph Node Status: Yes      Number of Lymph Nodes with Tumor: 0       Nodal Level(s)  Involved: 0       Number of Lymph Nodes Examined: 6       Nodal Level(s) Examined: 1  Pathologic Stage Classification (pTNM, AJCC 8th Edition): pT1a (multifocal), pN0  Representative Tumor Block: A5 (left inferior lobe) and A4 (left middle lobe).  Comment(s): There is a 0.6 cm papillary carcinoma in the left inferior  lobe and a 0.5 cm papillary carcinoma in the left mid lobe.  There is a  microscopic focus of extrathyroidal extension adjacent to the middle  lobe tumor.  The margins are negative for carcinoma.  There are  adenomatous nodules in the right and left lobes and the right lobe  adenomatous nodule is partially cystic.   RAI treatment (03/26/2022): 104.1 mCi  Pt denies: - dysphagia - choking - SOB with lying down She has some hoarseness.  No FH of thyroid cancer. No h/o radiation tx to head or neck.  She does have family history of hypothyroidism. No herbal supplements. No Biotin use. No recent steroids use.   After surgery, calcium remains normal: Lab Results  Component Value Date  CALCIUM 9.7 02/04/2022   CALCIUM 10.3 01/05/2022   CALCIUM 10.1 12/24/2021   CALCIUM 9.5 03/09/2018   CALCIUM 9.0 03/04/2018   CALCIUM 8.5 (L) 03/03/2018   CALCIUM 9.2 03/01/2018   CALCIUM 9.7 06/10/2017   CALCIUM 9.4 06/08/2016   CALCIUM 8.9 05/07/2015  01/15/2022: Calcium 9.4  Lab Results  Component Value Date   VD25OH 22.25 (L) 02/04/2022   VD25OH 41 04/10/2012  I advised her to start 1000 units vitamin D daily after the above results returned  She also has a history of osteoporosis per DXA scan from 09/2017.  She had a stress fracture in 01/2018 which evolved to a complete fracture. After her fracture, she used a lot of NSAIDs and she developed stomach and duodenal ulcers and actually developed a duodenal stricture.  She is on Fosamax.  ROS: + See HPI + tinnitus, + hypoacusis + HAs (since she was a child)  I reviewed patient medications, allergies, past medical history, social  history, family history and changes were documented in the history of present illness.  Past Medical History:  Diagnosis Date   Arthritis    Complication of anesthesia    Difficulty sleeping    DUE TO HIP PAIN   Headache    MIGRAINES   History of skin cancer    Hypertension    PONV (postoperative nausea and vomiting)    SEVERAL YRS AGO - NONE WITH MORE RECENT SURGERIES   Skin cancer    BCC, SCC   Past Surgical History:  Procedure Laterality Date   ABDOMINAL HYSTERECTOMY  1981   one ovary remains   APPENDECTOMY     ESOPHAGOGASTRODUODENOSCOPY (EGD) WITH PROPOFOL N/A 03/02/2018   Procedure: ESOPHAGOGASTRODUODENOSCOPY (EGD) WITH PROPOFOL;  Surgeon: Doran Stabler, MD;  Location: [facility];  Service: Gastroenterology;  Laterality: N/A;   ESOPHAGOGASTRODUODENOSCOPY (EGD) WITH PROPOFOL N/A 03/21/2018   Procedure: ESOPHAGOGASTRODUODENOSCOPY (EGD) WITH PROPOFOL;  Surgeon: Doran Stabler, MD;  Location: [facility];  Service: Gastroenterology;  Laterality: N/A;   LYMPH NODE DISSECTION N/A 01/04/2022   Procedure: LIMITED LYMPH NODE DISSECTION;  Surgeon: Armandina Gemma, MD;  Location: [facility];  Service: General;  Laterality: N/A;   SKIN CANCER EXCISION     THYROIDECTOMY N/A 01/04/2022   Procedure: TOTAL THYROIDECTOMY;  Surgeon: Armandina Gemma, MD;  Location: [facility];  Service: General;  Laterality: N/A;   TONSILLECTOMY AND ADENOIDECTOMY     TOTAL HIP ARTHROPLASTY Right 07/11/2011   HIP; [facility]   TOTAL HIP ARTHROPLASTY Left 05/06/2015   Procedure: LEFT TOTAL HIP ARTHROPLASTY ANTERIOR APPROACH;  Surgeon: Paralee Cancel, MD;  Location: [facility];  Service: Orthopedics;  Laterality: Left;   Social History   Socioeconomic History   Marital status: Single    Spouse name: n/a   Number of children: 0   Years of education: college   Highest education level: Not on file  Occupational History   Occupation: Aeronautical engineer: [employer]   Occupation: IT consultant: [employer]  Tobacco Use   Smoking status: Former    Types: Cigarettes    Quit date: 04/30/1994    Years since quitting: 28.2   Smokeless tobacco: Former    Quit date: 03/09/1993  Vaping Use   Vaping Use: Never used  Substance and Sexual Activity   Alcohol use: No    Alcohol/week: 0.0 standard drinks of alcohol   Drug use: No   Sexual activity: Never  Other Topics  Concern   Not on file  Social History Narrative   Home Environment: Lives at home alone with her two dogs. One level home with a few stairs to the front and back doors.   Diet: Stays clear of carbohydrates and chocolate because they trigger headaches. Likes meat/chicken and vegetables. Generally healthy.   Exercise: Stays busy and active at work. Takes the stairs instead of the elevator.   Sleep: Significantly improved since hip replacement surgeries. Goes to bed around 9 and wakes up at 6. Able to work full days.   Work: Still enjoys working at [employer].   Urinary problems: No concerns. Hydrates well.      Brother lives in [place], [place]   Social Determinants of Health   Financial Resource Strain: Not on file  Food Insecurity: Not on file  Transportation Needs: Not on file  Physical Activity: Not on file  Stress: Not on file  Social Connections: Not on file  Intimate Partner Violence: Not on file   Current Outpatient Medications on File Prior to Visit  Medication Sig Dispense Refill   acetaminophen (TYLENOL) 325 MG tablet Take 2 tablets (650 mg total) by mouth every 6 (six) hours as needed for mild pain (or Fever >/= 101).     alendronate (FOSAMAX) 70 MG tablet Take 1 tablet (70 mg total) by mouth every 7 (seven) days. Take with a full glass of water on an empty stomach. 12 tablet 3   amLODipine (NORVASC) 5 MG tablet Take 5 mg by mouth daily.     calcium carbonate (TUMS) 500 MG chewable tablet Chew 2 tablets (400 mg of elemental calcium total) by mouth 2 (two) times daily. 90 tablet 1    Calcium Carbonate Antacid (TUMS ULTRA 1000 PO) Take 1,000 mg by mouth daily.     levothyroxine (SYNTHROID) 75 MCG tablet Take 1 tablet (75 mcg total) by mouth daily. 45 tablet 3   Multiple Vitamin (MULTIVITAMIN WITH MINERALS) TABS tablet Take 1 tablet by mouth daily.     Omega-3 Fatty Acids (FISH OIL PO) Take 1 capsule by mouth daily.     SUMAtriptan (IMITREX) 100 MG tablet Take 1 as needed for migraineMay repeat in 2 hours if headache persists or recurs. 9 tablet 12   No current facility-administered medications on file prior to visit.   Allergies  Allergen Reactions   Darvon [Propoxyphene Hcl] Other (See Comments)    "climb the walls"   Vicodin [Hydrocodone-Acetaminophen] Other (See Comments)    "climb the walls"   Family History  Problem Relation Age of Onset   Cancer Mother 41       breast cancer   Cancer Maternal Grandmother        breast cancer   Kidney disease Maternal Grandfather    Arthritis Paternal Grandmother     PE: BP (!) 148/92 (BP Location: Right Arm, Patient Position: Sitting, Cuff Size: Normal)   Pulse 64   Ht 5' 3"  (1.6 m)   Wt 130 lb 6.4 oz (59.1 kg)   SpO2 99%   BMI 23.10 kg/m  Wt Readings from Last 3 Encounters:  07/16/22 130 lb 6.4 oz (59.1 kg)  02/04/22 134 lb 3.2 oz (60.9 kg)  01/04/22 131 lb 6.4 oz (59.6 kg)   Constitutional: normal weight, in NAD Eyes: EOMI, no exophthalmos ENT: moist mucous membranes, no neck masses palpated, thyroidectomy scar healing well, no cervical lymphadenopathy Cardiovascular: RRR, No MRG Respiratory: CTA B Musculoskeletal: no deformities Skin: moist, warm Neurological: no tremor with outstretched  hands  ASSESSMENT: 1.  History of Graves' disease  2.  Thyroid nodules  3.  Papillary thyroid cancer  4.  Postsurgical hypothyroidism  5.  Vitamin D insufficiency  PLAN:  1. And 2. Patient with a long history of low TSH, with some thyrotoxic symptoms.  She did not have weight loss, palpitations, anxiety but had  heat intolerance and increased sweating in the past, which reappeared before she reestablished care with me. -We started her on methimazole at 5 mg twice a day and she started to feel better. -She had total thyroidectomy 01/04/2022 by Dr. Harlow Asa  3 PTC -Patient has a history of a right thyroid nodule for which she refused further investigation in the past -However, after she reestablished care with me in 09/2021, we checked another thyroid ultrasound and, since this nodule was still suspicious, I again suggested biopsy.  This returns consistent with papillary thyroid cancer. -She had total thyroidectomy in 12/2021 by Dr. Harlow Asa and final pathology showed papillary thyroid cancer -She had slight extrathyroidal extension of the tumor, which put her at the threshold between low and intermediate risk.  Multifocality does not necessarily put her at high risk for recurrence.  I recommended RAI treatment, and she had this in 03/2022. -At this visit, we discussed about the purpose of checking thyroglobulin and antithyroglobulin antibodies.  I explained that the ideal Tg values and trends -Plan to repeat imaging in 1 year after her surgery, we will likely check a neck ultrasound.  4.  Postsurgical hypothyroidism - latest thyroid labs reviewed with pt. >> normal: Lab Results  Component Value Date   TSH 0.77 05/06/2022  - she continues on LT4 88 mcg daily, dose decreased at last visit - pt feels good on this dose. - we discussed about taking the thyroid hormone every day, with water, >30 minutes before breakfast, separated by >4 hours from acid reflux medications, calcium, iron, multivitamins. Pt. is taking it correctly. - will check thyroid tests today: TSH and fT4 - If labs are abnormal, she will need to return for repeat TFTs in 1.5 months  5.  Vitamin D insufficiency -At last visit, vitamin D level was 22.25, lower than our target of 30 or higher. -I advised her to start 1000 units vitamin D daily  >> she takes this now (may forget doses) -We will recheck her vitamin D level today  Component     Latest Ref Rng 07/16/2022  T4,Free(Direct)     0.60 - 1.60 ng/dL 1.04   TSH     0.35 - 5.50 uIU/mL 1.00   VITD     30.00 - 100.00 ng/mL 25.39 (L)   Thyroglobulin     ng/mL <0.1 (L)   Comment --   Thyroglobulin Ab     < or = 1 IU/mL 1   Excellent thyroglobulin and TFTs.  However, vitamin D level is low.  I will advise her to increase the vitamin D daily dose to 2000 units daily.  Philemon Kingdom, MD PhD [facility] Endocr[facility]

## 2022-07-16 NOTE — Patient Instructions (Addendum)
Please continue Levothyroxine 75 mcg daily.  Take the thyroid hormone every day, with water, at least 30 minutes before breakfast, separated by at least 4 hours from: - acid reflux medications - calcium - iron - multivitamins  Continue vitamin D 1000 units daily.  Please stop at the lab.  Please return in 6 months.

## 2022-07-18 LAB — THYROGLOBULIN LEVEL: Thyroglobulin: <0.1 ng/mL — ABNORMAL LOW

## 2022-07-18 LAB — THYROGLOBULIN ANTIBODY: Thyroglobulin Ab: 1 IU/mL (ref <=1)

## 2022-07-19 ENCOUNTER — Encounter: Payer: Self-pay | Admitting: Internal Medicine

## 2022-09-08 ENCOUNTER — Other Ambulatory Visit: Payer: Self-pay | Admitting: Internal Medicine

## 2022-10-04 DIAGNOSIS — H9190 Unspecified hearing loss, unspecified ear: Secondary | ICD-10-CM | POA: Insufficient documentation

## 2023-01-06 ENCOUNTER — Ambulatory Visit: Payer: BC Managed Care – PPO | Admitting: Internal Medicine

## 2023-01-06 ENCOUNTER — Encounter: Payer: Self-pay | Admitting: Internal Medicine

## 2023-01-06 VITALS — BP 132/82 | HR 74 | Ht 63.0 in | Wt 140.8 lb

## 2023-01-06 DIAGNOSIS — C73 Malignant neoplasm of thyroid gland: Secondary | ICD-10-CM | POA: Diagnosis not present

## 2023-01-06 DIAGNOSIS — E559 Vitamin D deficiency, unspecified: Secondary | ICD-10-CM | POA: Diagnosis not present

## 2023-01-06 DIAGNOSIS — E0500 Thyrotoxicosis with diffuse goiter without thyrotoxic crisis or storm: Secondary | ICD-10-CM

## 2023-01-06 DIAGNOSIS — E890 Postprocedural hypothyroidism: Secondary | ICD-10-CM

## 2023-01-06 LAB — TSH: TSH: 2.64 u[IU]/mL (ref 0.35–5.50)

## 2023-01-06 LAB — T4, FREE: Free T4: 0.93 ng/dL (ref 0.60–1.60)

## 2023-01-06 LAB — VITAMIN D 25 HYDROXY (VIT D DEFICIENCY, FRACTURES): VITD: 34.3 ng/mL (ref 30.00–100.00)

## 2023-01-06 NOTE — Progress Notes (Addendum)
Patient ID: Carol King, female   DOB: [birth year], 72 y.o.   MRN: [id number]  HPI  Carol King is a 72 y.o.-year-old female, returning for follow-up for Graves' disease and thyroid nodules, now with a new diagnosis of papillary thyroid cancer.  She was previously seen by Dr. Dwyane Dee once, in 09/2014.  Last visit 5 months ago.    Interim history: At this visit, she feels well, but with increased fatigue and weight gain (10 lbs per our scale). No cold intolerance, constipation.  Reviewed and addended history: She has a long history of suppressed TSH, per my records, this was first documented in 2014.  Since then, she saw Dr. Dwyane Dee in 2015 and he checked a thyroid uptake and scan and a thyroid ultrasound.  She did not return to see him afterwards.  TFTs in 2017 and 2018 were worse.  She started to see me in 2019, and we have been adjusting her methimazole dose, but she was lost for follow-up with me, also, after her visit in 08/2018.  She returned to see me in 2022 after a new referral was made due to persistently abnormal TFTs.  She was subsequently diagnosed with thyroid cancer and had thyroidectomy and RAI treatment (see below).  She is now on LT4 75 mcg daily: - in am (6 am) - fasting - at least 30 min from b'fast - no calcium  - occas., later in the day - no iron - + multivitamins - x2 (total 500 mg) at night - no PPIs - not on Biotin  I reviewed patient's TFTs: Lab Results  Component Value Date   TSH 1.00 07/16/2022   TSH 0.77 05/06/2022   TSH 0.17 (L) 03/16/2022   TSH 0.04 (L) 02/04/2022   TSH 0.11 (L) 12/30/2021   TSH 0.00 (L) 10/27/2021   TSH <0.01 (L) 08/15/2018   TSH 0.02 (L) 06/20/2018   TSH <0.01 (L) 05/10/2018   TSH <0.01 Repeated and verified X2. (L) 03/27/2018   FREET4 1.04 07/16/2022   FREET4 0.93 05/06/2022   FREET4 1.28 03/16/2022   FREET4 1.27 02/04/2022   FREET4 0.99 12/30/2021   FREET4 1.19 10/27/2021   FREET4 1.28 08/15/2018   FREET4 0.55 (L)  06/20/2018   FREET4 1.13 05/10/2018   FREET4 1.94 (H) 03/27/2018   T3FREE 3.5 12/30/2021   T3FREE 3.0 10/27/2021   T3FREE 5.2 (H) 08/15/2018   T3FREE 2.6 06/20/2018   T3FREE 3.1 05/10/2018   T3FREE 4.9 (H) 03/27/2018   T3FREE 4.0 06/08/2016   T3FREE 2.6 09/18/2014  09/24/2021: TSH <0.005, free T4 2.02,  TT3  122 (71-180) 09/30/2020: TSH <0.005, free T4 2.47 (0.82-1.77), WBC normal, 6.7 07/06/2019: TSH <0.006,  normal total T3 and total T4  Her Graves' antibodies were elevated: Lab Results  Component Value Date   TSI 427 (H) 03/27/2018    Thyroid uptake and scan (10/08/2014): Uptake 9.9%, consistent with subacute thyroiditis.  Possible cold nodule in the lower right lobe.  Thyroid ultrasound (10/31/2014): 1.8 cm right thyroid nodule, corresponding to the cold nodule on the thyroid scan. She refused a thyroid biopsy at that time.  Thyroid U/S (09/30/2021): Parenchymal Echotexture: Mildly heterogeneous Isthmus: 0.2 cm Right lobe: 5.5 x 2.2 x 2.1 cm Left lobe: 4.1 x 1.5 x 1.6 cm _________________________________________________________   Nodule 1: 3.6 x 3.1 x 2.2 cm cystic nodule in the inferior right thyroid lobe does not meet criteria for FNA or imaging follow-up. It has increased in size since prior examination, however predominately due to accumulation  of fluid.   _________________________________________________________   Nodule 2: 1.0 x 0.7 x 0.7 cm spongiform nodule in the superior left thyroid lobe does not meet criteria for FNA or imaging surveillance. _________________________________________________________   Nodule # 3: Location: Left; mid Maximum size: 1.2 cm; Other 2 dimensions: 0.8 x 0.7 cm Composition: solid/almost completely solid (2) Echogenicity: hypoechoic (2) Echogenic foci: punctate echogenic foci (3)   **Given size (>/= 1.0 cm) and appearance, fine needle aspiration of this highly suspicious nodule should be considered based on  TI-RADS criteria.    _________________________________________________________   IMPRESSION: Nodule 3 (TI-RADS 5) located in the mid left thyroid lobe meets criteria for FNA.  FNA of the left mid thyroid nodule (10/27/2021): THYROID, LEFT MID, FINE NEEDLE ASPIRATION:   FINAL MICROSCOPIC DIAGNOSIS:  - Findings consistent with papillary carcinoma (Bethesda category VI)  - See comment   SPECIMEN ADEQUACY:  Satisfactory for evaluation   Total thyroidectomy (01/04/2022) by Dr. Harlow Asa:  A. THYROID, TOTAL THYROIDECTOMY:  - Multifocal papillary thyroid carcinoma, 0.6 and 0.5 cm.  - Microscopic focus of extrathyroidal extension.  - Margins negative for carcinoma.  - Adenomatous nodules, partially cystic.  - See oncology table.   B. LYMPH NODES, CENTRAL COMPARTMENT, RESECTION:  - Six lymph nodes negative for metastatic carcinoma (0/6).   ONCOLOGY TABLE:  THYROID GLAND, CARCINOMA: Resection  Procedure: Total thyroidectomy and central compartment lymph node  excision.  Tumor Focality: Multifocal, 2 tumors.  Tumor Site: Left inferior and left mid lobe.  Tumor Size: 0.6 cm (left inferior lobe) and 0.5 cm (left mid lobe).  Histologic Type: Papillary thyroid carcinoma, classic type.  Angioinvasion: Not identified.  Lymphatic Invasion: Not identified.  Extrathyroidal Extension: Present, microscopic (left mid lobe tumor).  Margin Status: All surgical margins negative for carcinoma.  Regional Lymph Node Status: Yes      Number of Lymph Nodes with Tumor: 0       Nodal Level(s) Involved: 0       Number of Lymph Nodes Examined: 6       Nodal Level(s) Examined: 1  Pathologic Stage Classification (pTNM, AJCC 8th Edition): pT1a (multifocal), pN0  Representative Tumor Block: A5 (left inferior lobe) and A4 (left middle lobe).  Comment(s): There is a 0.6 cm papillary carcinoma in the left inferior  lobe and a 0.5 cm papillary carcinoma in the left mid lobe.  There is a  microscopic focus of  extrathyroidal extension adjacent to the middle  lobe tumor.  The margins are negative for carcinoma.  There are  adenomatous nodules in the right and left lobes and the right lobe  adenomatous nodule is partially cystic.   RAI treatment (03/26/2022): 104.1 mCi  04/01/2022: Posttreatment WBC:  1. Focal activity in thyroid bed is favored remnant thyroid tissue. 2. No evidence of metastatic disease  Reviewed thyroglobulin levels:  Lab Results  Component Value Date   THYROGLB <0.1 (L) 07/16/2022   THGAB 1 07/16/2022   Pt denies: - dysphagia - choking - SOB with lying down She has some hoarseness.  No FH of thyroid cancer. No h/o radiation tx to head or neck.  She does have family history of hypothyroidism. No herbal supplements. No Biotin use. No recent steroids use.   After surgery, calcium remains normal: Lab Results  Component Value Date   CALCIUM 9.7 02/04/2022   CALCIUM 10.3 01/05/2022   CALCIUM 10.1 12/24/2021   CALCIUM 9.5 03/09/2018   CALCIUM 9.0 03/04/2018   CALCIUM 8.5 (L) 03/03/2018   CALCIUM 9.2 03/01/2018  CALCIUM 9.7 06/10/2017   CALCIUM 9.4 06/08/2016   CALCIUM 8.9 05/07/2015  01/15/2022: Calcium 9.4  Lab Results  Component Value Date   VD25OH 25.39 (L) 07/16/2022   VD25OH 22.25 (L) 02/04/2022   VD25OH 41 04/10/2012  We started 1000 units vitamin D daily in 01/2022 and increase the dose to 2000 units daily in 07/2022.  She also has a history of osteoporosis per DXA scan from 09/2017.  She had a stress fracture in 01/2018 which evolved to a complete fracture. After her fracture, she used a lot of NSAIDs and she developed stomach and duodenal ulcers and actually developed a duodenal stricture.  She is on Fosamax.  ROS: + See HPI + tinnitus, + hypoacusis + HAs (since she was a child)  I reviewed patient medications, allergies, past medical history, social history, family history and changes were documented in the history of present illness.  Past Medical  History:  Diagnosis Date   Arthritis    Complication of anesthesia    Difficulty sleeping    DUE TO HIP PAIN   Headache    MIGRAINES   History of skin cancer    Hypertension    PONV (postoperative nausea and vomiting)    SEVERAL YRS AGO - NONE WITH MORE RECENT SURGERIES   Skin cancer    BCC, SCC   Past Surgical History:  Procedure Laterality Date   ABDOMINAL HYSTERECTOMY  1981   one ovary remains   APPENDECTOMY     ESOPHAGOGASTRODUODENOSCOPY (EGD) WITH PROPOFOL N/A 03/02/2018   Procedure: ESOPHAGOGASTRODUODENOSCOPY (EGD) WITH PROPOFOL;  Surgeon: Doran Stabler, MD;  Location: [facility];  Service: Gastroenterology;  Laterality: N/A;   ESOPHAGOGASTRODUODENOSCOPY (EGD) WITH PROPOFOL N/A 03/21/2018   Procedure: ESOPHAGOGASTRODUODENOSCOPY (EGD) WITH PROPOFOL;  Surgeon: Doran Stabler, MD;  Location: [facility];  Service: Gastroenterology;  Laterality: N/A;   LYMPH NODE DISSECTION N/A 01/04/2022   Procedure: LIMITED LYMPH NODE DISSECTION;  Surgeon: Armandina Gemma, MD;  Location: [facility];  Service: General;  Laterality: N/A;   SKIN CANCER EXCISION     THYROIDECTOMY N/A 01/04/2022   Procedure: TOTAL THYROIDECTOMY;  Surgeon: Armandina Gemma, MD;  Location: [facility];  Service: General;  Laterality: N/A;   TONSILLECTOMY AND ADENOIDECTOMY     TOTAL HIP ARTHROPLASTY Right 07/11/2011   HIP; [facility]   TOTAL HIP ARTHROPLASTY Left 05/06/2015   Procedure: LEFT TOTAL HIP ARTHROPLASTY ANTERIOR APPROACH;  Surgeon: Paralee Cancel, MD;  Location: [facility];  Service: Orthopedics;  Laterality: Left;   Social History   Socioeconomic History   Marital status: Single    Spouse name: n/a   Number of children: 0   Years of education: college   Highest education level: Not on file  Occupational History   Occupation: Aeronautical engineer: [employer]   Occupation: IT consultant: [employer]  Tobacco Use   Smoking status: Former    Types: Cigarettes     Quit date: 04/30/1994    Years since quitting: 28.7   Smokeless tobacco: Former    Quit date: 03/09/1993  Vaping Use   Vaping Use: Never used  Substance and Sexual Activity   Alcohol use: No    Alcohol/week: 0.0 standard drinks of alcohol   Drug use: No   Sexual activity: Never  Other Topics Concern   Not on file  Social History Narrative   Home Environment: Lives at home alone with her two dogs. One level home with  a few stairs to the front and back doors.   Diet: Stays clear of carbohydrates and chocolate because they trigger headaches. Likes meat/chicken and vegetables. Generally healthy.   Exercise: Stays busy and active at work. Takes the stairs instead of the elevator.   Sleep: Significantly improved since hip replacement surgeries. Goes to bed around 9 and wakes up at 6. Able to work full days.   Work: Still enjoys working at [employer].   Urinary problems: No concerns. Hydrates well.      Brother lives in [place], [place]   Social Determinants of Health   Financial Resource Strain: Not on file  Food Insecurity: Not on file  Transportation Needs: Not on file  Physical Activity: Not on file  Stress: Not on file  Social Connections: Not on file  Intimate Partner Violence: Not on file   Current Outpatient Medications on File Prior to Visit  Medication Sig Dispense Refill   acetaminophen (TYLENOL) 325 MG tablet Take 2 tablets (650 mg total) by mouth every 6 (six) hours as needed for mild pain (or Fever >/= 101).     alendronate (FOSAMAX) 70 MG tablet Take 1 tablet (70 mg total) by mouth every 7 (seven) days. Take with a full glass of water on an empty stomach. 12 tablet 3   amLODipine (NORVASC) 5 MG tablet Take 5 mg by mouth daily.     calcium carbonate (TUMS) 500 MG chewable tablet Chew 2 tablets (400 mg of elemental calcium total) by mouth 2 (two) times daily. 90 tablet 1   Calcium Carbonate Antacid (TUMS ULTRA 1000 PO) Take 1,000 mg by mouth daily.     levothyroxine  (SYNTHROID) 75 MCG tablet TAKE 1 TABLET BY MOUTH EVERY DAY 30 tablet 5   Multiple Vitamin (MULTIVITAMIN WITH MINERALS) TABS tablet Take 1 tablet by mouth daily.     Omega-3 Fatty Acids (FISH OIL PO) Take 1 capsule by mouth daily.     SUMAtriptan (IMITREX) 100 MG tablet Take 1 as needed for migraineMay repeat in 2 hours if headache persists or recurs. 9 tablet 12   No current facility-administered medications on file prior to visit.   Allergies  Allergen Reactions   Darvon [Propoxyphene Hcl] Other (See Comments)    "climb the walls"   Vicodin [Hydrocodone-Acetaminophen] Other (See Comments)    "climb the walls"   Family History  Problem Relation Age of Onset   Cancer Mother 63       breast cancer   Cancer Maternal Grandmother        breast cancer   Kidney disease Maternal Grandfather    Arthritis Paternal Grandmother     PE: BP 132/82 (BP Location: Right Arm, Patient Position: Sitting, Cuff Size: Normal)   Pulse 74   Ht '5\' 3"'$  (1.6 m)   Wt 140 lb 12.8 oz (63.9 kg)   SpO2 97%   BMI 24.94 kg/m  Wt Readings from Last 3 Encounters:  01/06/23 140 lb 12.8 oz (63.9 kg)  07/16/22 130 lb 6.4 oz (59.1 kg)  02/04/22 134 lb 3.2 oz (60.9 kg)   Constitutional: normal weight, in NAD Eyes: EOMI, no exophthalmos ENT: no neck masses palpated, thyroidectomy scar healing well, no cervical lymphadenopathy Cardiovascular: RRR, No MRG Respiratory: CTA B Musculoskeletal: no deformities Skin: moist, warm Neurological: no tremor with outstretched hands  ASSESSMENT: 1.  History of Graves' disease  2.  Thyroid nodules  3.  Papillary thyroid cancer  4.  Postsurgical hypothyroidism  5.  Vitamin D insufficiency  PLAN:  1. And 2. Patient with a long history of low TSH, with some thyrotoxic symptoms.  She did not have weight loss, palpitations, anxiety but had heat intolerance and increased sweating in the past, which reappeared before she reestablished care with me. -We started her on  methimazole at 5 mg twice a day and she started to feel better. -She had total thyroidectomy 01/04/2022 by Dr. Harlow Asa  3 PTC -Patient has a history of a right thyroid nodule for which she refused further investigation in the past -However, after she reestablished care with me in 09/2021, we checked another thyroid ultrasound and, since this nodule was still suspicious, I again suggested biopsy.  This returns consistent with papillary thyroid cancer. -She had total thyroidectomy in 12/2021 by Dr. Harlow Asa and final pathology showed papillary thyroid cancer -She had slight extrathyroidal extension of the tumor, which put her at the threshold between low and intermediate risk.  Multifocality does not necessarily put her at high risk for recurrence.  -She had radioactive iodine treatment and a whole-body scan showed no abnormal uptake, only thyroid remnant uptake, which was expected -At last check, thyroglobulin level was undetectable, as were her thyroglobulin antibodies. -We will recheck a neck ultrasound in 3 months.  I advised the patient to send me a message so I can order it. -I plan to see her back in a year  4.  Postsurgical hypothyroidism - latest thyroid labs reviewed with pt. >> normal: Lab Results  Component Value Date   TSH 1.00 07/16/2022  - she continues on LT4 75 mcg daily - pt feels good on this dose. - we discussed about taking the thyroid hormone every day, with water, >30 minutes before breakfast, separated by >4 hours from acid reflux medications, calcium, iron, multivitamins. Pt. is taking it correctly. - will check thyroid tests today: TSH and fT4 - If labs are abnormal, she will need to return for repeat TFTs in 1.5 months  5.  Vitamin D insufficiency -We reviewed together her latest vitamin D from 07/2022: 25.4, lower than our target of 30 or higher -At last visit, she was on 1000 units vitamin D daily, but forgetting doses.  I advised her to increase the daily dose to  2000 units daily - not daily as she forgets doses >> recommended either pillbox or setting alarms on her phone to take this more consistently -We will recheck the level today  Needs refills - 90 days.   Component     Latest Ref Rng 01/06/2023  T4,Free(Direct)     0.60 - 1.60 ng/dL 0.93   TSH     0.35 - 5.50 uIU/mL 2.64   VITD     30.00 - 100.00 ng/mL 34.30    TSH is a little higher than target, I would suggest to increase the dose of LT4 to 88 mcg daily and have her back for labs in 1.5 months. Vitamin D level is normal. Thyroglobulin undetectable: Component     Latest Ref Rng 01/06/2023  Thyroglobulin     ng/mL <0.1 (L)   Comment --   Thyroglobulin Ab     < or = 1 IU/mL 1    Philemon Kingdom, MD PhD [facility] Endocrinology

## 2023-01-06 NOTE — Patient Instructions (Addendum)
Please continue Levothyroxine 75 mcg daily.  Take the thyroid hormone every day, with water, at least 30 minutes before breakfast, separated by at least 4 hours from: - acid reflux medications - calcium - iron - multivitamins  Continue vitamin D 2000 units daily - take this daily.  Please stop at the lab.  Please send me a message in 3 months to order a new neck U/S.  Please return in 1 year.

## 2023-01-07 LAB — THYROGLOBULIN LEVEL: Thyroglobulin: <0.1 ng/mL — ABNORMAL LOW

## 2023-01-07 LAB — THYROGLOBULIN ANTIBODY: Thyroglobulin Ab: 1 IU/mL (ref <=1)

## 2023-01-07 MED ORDER — LEVOTHYROXINE SODIUM 88 MCG PO TABS: 88.0000 ug | ORAL_TABLET | Freq: Every day | ORAL | 5 refills | Status: DC

## 2023-02-23 ENCOUNTER — Other Ambulatory Visit (INDEPENDENT_AMBULATORY_CARE_PROVIDER_SITE_OTHER): Payer: BC Managed Care – PPO

## 2023-02-23 DIAGNOSIS — E890 Postprocedural hypothyroidism: Secondary | ICD-10-CM | POA: Diagnosis not present

## 2023-02-23 LAB — TSH: TSH: 1.71 u[IU]/mL (ref 0.35–5.50)

## 2023-02-23 LAB — T4, FREE: Free T4: 0.97 ng/dL (ref 0.60–1.60)

## 2023-02-28 ENCOUNTER — Other Ambulatory Visit: Payer: Self-pay | Admitting: Internal Medicine

## 2023-02-28 DIAGNOSIS — C73 Malignant neoplasm of thyroid gland: Secondary | ICD-10-CM

## 2023-03-04 ENCOUNTER — Other Ambulatory Visit: Payer: Self-pay | Admitting: Internal Medicine

## 2023-03-15 ENCOUNTER — Ambulatory Visit
Admission: RE | Admit: 2023-03-15 | Discharge: 2023-03-15 | Disposition: A | Discharge status: Home / Self Care | Payer: BC Managed Care – PPO | Source: Ambulatory Visit | Attending: Internal Medicine | Admitting: Internal Medicine

## 2023-03-15 DIAGNOSIS — C73 Malignant neoplasm of thyroid gland: Secondary | ICD-10-CM

## 2023-07-16 ENCOUNTER — Other Ambulatory Visit: Payer: Self-pay | Admitting: Internal Medicine

## 2023-08-17 ENCOUNTER — Ambulatory Visit (AMBULATORY_SURGERY_CENTER): Payer: BC Managed Care – PPO

## 2023-08-17 VITALS — Ht 62.0 in | Wt 121.6 lb

## 2023-08-17 DIAGNOSIS — Z1211 Encounter for screening for malignant neoplasm of colon: Secondary | ICD-10-CM

## 2023-08-17 MED ORDER — NA SULFATE-K SULFATE-MG SULF 17.5-3.13-1.6 GM/177ML PO SOLN: 1.0000 | Freq: Once | ORAL | 0 refills | Status: AC

## 2023-08-17 NOTE — Progress Notes (Signed)

## 2023-08-23 ENCOUNTER — Encounter: Payer: Self-pay | Admitting: Gastroenterology

## 2023-09-05 ENCOUNTER — Ambulatory Visit (AMBULATORY_SURGERY_CENTER): Payer: BC Managed Care – PPO | Admitting: Gastroenterology

## 2023-09-05 ENCOUNTER — Encounter: Payer: Self-pay | Admitting: Gastroenterology

## 2023-09-05 VITALS — BP 129/80 | HR 66 | Temp 97.1°F | Resp 11 | Ht 62.0 in | Wt 121.6 lb

## 2023-09-05 DIAGNOSIS — D122 Benign neoplasm of ascending colon: Secondary | ICD-10-CM | POA: Diagnosis not present

## 2023-09-05 DIAGNOSIS — D128 Benign neoplasm of rectum: Secondary | ICD-10-CM | POA: Diagnosis not present

## 2023-09-05 DIAGNOSIS — D120 Benign neoplasm of cecum: Secondary | ICD-10-CM | POA: Diagnosis not present

## 2023-09-05 DIAGNOSIS — Z1211 Encounter for screening for malignant neoplasm of colon: Secondary | ICD-10-CM

## 2023-09-05 NOTE — Progress Notes (Signed)
Called to room to assist during endoscopic procedure.  Patient ID and intended procedure confirmed with present staff. Received instructions for my participation in the procedure from the performing physician.  

## 2023-09-05 NOTE — Op Note (Signed)
[facility] Patient Name: Hee Kha Procedure Date: 09/05/2023 7:34 AM MRN: [id number] Endoscopist: Sherilyn Cooter L. Myrtie Neither , MD, [id number] Age: 72 Referring MD:  Date of Birth: 1951/02/07 Gender: Female Account #: [id number] Procedure:                Colonoscopy Indications:              Screening for colorectal malignant neoplasm, This                            is the patient's first colonoscopy Medicines:                Monitored Anesthesia Care Procedure:                Pre-Anesthesia Assessment:                           - Prior to the procedure, a History and Physical                            was performed, and patient medications and                            allergies were reviewed. The patient's tolerance of                            previous anesthesia was also reviewed. The risks                            and benefits of the procedure and the sedation                            options and risks were discussed with the patient.                            All questions were answered, and informed consent                            was obtained. Prior Anticoagulants: The patient has                            taken no anticoagulant or antiplatelet agents. ASA                            Grade Assessment: II - A patient with mild systemic                            disease. After reviewing the risks and benefits,                            the patient was deemed in satisfactory condition to                            undergo the procedure.  After obtaining informed consent, the colonoscope                            was passed under direct vision. Throughout the                            procedure, the patient's blood pressure, pulse, and                            oxygen saturations were monitored continuously. The                            Olympus CF-HQ190L ([id number]) Colonoscope was                            introduced through the  anus and advanced to the the                            cecum, identified by appendiceal orifice and                            ileocecal valve. The colonoscopy was performed with                            difficulty due to a redundant colon and significant                            looping. Successful completion of the procedure was                            aided by changing the patient's position, using                            manual pressure and straightening and shortening                            the scope to obtain bowel loop reduction. The                            patient tolerated the procedure well. The quality                            of the bowel preparation was good. The ileocecal                            valve, appendiceal orifice, and rectum were                            photographed. Scope In: 8:14:53 AM Scope Out: 8:53:39 AM Scope Withdrawal Time: 0 hours 26 minutes 57 seconds  Total Procedure Duration: 0 hours 38 minutes 46 seconds  Findings:                 The perianal and digital rectal examinations were  normal.                           Repeat examination of right colon under NBI                            performed.                           A 12 mm polyp was found in the cecum. The polyp was                            sessile. The polyp was removed with a piecemeal                            technique using a cold snare. Resection and                            retrieval were complete.                           A 6 mm polyp was found in the ascending colon. The                            polyp was semi-sessile. The polyp was removed with                            a cold snare. Resection and retrieval were complete.                           An 18-20 mm polyp was found in the mid rectum                            (anterior wall). The polyp was semi-pedunculated.                            Area was successfully injected  with 1.5 mL of a                            0.01 mg/mL solution of epinephrine for hemostasis.                            The polyp was removed with a piecemeal technique                            using a hot snare. Resection and retrieval were                            complete. Site treated with snare tip cautery to                            ensure no residual polyp tissue.  The exam was otherwise without abnormality on                            direct and retroflexion views. Complications:            No immediate complications. Estimated Blood Loss:     Estimated blood loss was minimal. Impression:               - One 12 mm polyp in the cecum, removed piecemeal                            using a cold snare. Resected and retrieved.                           - One 6 mm polyp in the ascending colon, removed                            with a cold snare. Resected and retrieved.                           - One 18-20 mm polyp in the mid rectum, removed                            piecemeal using a hot snare. Resected and                            retrieved. Injected.                           - The examination was otherwise normal on direct                            and retroflexion views. Recommendation:           - Patient has a contact number available for                            emergencies. The signs and symptoms of potential                            delayed complications were discussed with the                            patient. Return to normal activities tomorrow.                            Written discharge instructions were provided to the                            patient.                           - Resume previous diet.                           - Continue present medications.                           -  No aspirin, ibuprofen, naproxen, or other                            non-steroidal anti-inflammatory drugs for 1 week                             after polyp removal.                           - Await pathology results.                           - Repeat colonoscopy is recommended for                            surveillance. The colonoscopy date will be                            determined after pathology results from today's                            exam become available for review. Taz Vanness L. Myrtie Neither, MD 09/05/2023 9:03:46 AM This report has been signed electronically.

## 2023-09-05 NOTE — Progress Notes (Signed)
History and Physical:  This patient presents for endoscopic testing for: Encounter Diagnosis  Name Primary?   Special screening for malignant neoplasms, colon Yes    Average risk for colorectal cancer. First screening colonoscopy. Patient denies chronic abdominal pain, rectal bleeding, constipation or diarrhea.    Patient is otherwise without complaints or active issues today.   Past Medical History: Past Medical History:  Diagnosis Date   Arthritis    Complication of anesthesia    Difficulty sleeping    DUE TO HIP PAIN   Headache    MIGRAINES   History of skin cancer    Hypertension    PONV (postoperative nausea and vomiting)    SEVERAL YRS AGO - NONE WITH MORE RECENT SURGERIES   Skin cancer    BCC, SCC     Past Surgical History: Past Surgical History:  Procedure Laterality Date   ABDOMINAL HYSTERECTOMY  1981   one ovary remains   APPENDECTOMY     ESOPHAGOGASTRODUODENOSCOPY (EGD) WITH PROPOFOL N/A 03/02/2018   Procedure: ESOPHAGOGASTRODUODENOSCOPY (EGD) WITH PROPOFOL;  Surgeon: Sherrilyn Rist, MD;  Location: [facility];  Service: Gastroenterology;  Laterality: N/A;   ESOPHAGOGASTRODUODENOSCOPY (EGD) WITH PROPOFOL N/A 03/21/2018   Procedure: ESOPHAGOGASTRODUODENOSCOPY (EGD) WITH PROPOFOL;  Surgeon: Sherrilyn Rist, MD;  Location: [facility];  Service: Gastroenterology;  Laterality: N/A;   LYMPH NODE DISSECTION N/A 01/04/2022   Procedure: LIMITED LYMPH NODE DISSECTION;  Surgeon: Darnell Level, MD;  Location: [facility];  Service: General;  Laterality: N/A;   SKIN CANCER EXCISION     THYROIDECTOMY N/A 01/04/2022   Procedure: TOTAL THYROIDECTOMY;  Surgeon: Darnell Level, MD;  Location: [facility];  Service: General;  Laterality: N/A;   TONSILLECTOMY AND ADENOIDECTOMY     TOTAL HIP ARTHROPLASTY Right 07/11/2011   HIP; Charlann Boxer   TOTAL HIP ARTHROPLASTY Left 05/06/2015   Procedure: LEFT TOTAL HIP ARTHROPLASTY ANTERIOR APPROACH;  Surgeon: Durene Romans, MD;  Location: [facility];   Service: Orthopedics;  Laterality: Left;    Allergies: Allergies  Allergen Reactions   Propoxyphene     Other Reaction(s): Confusion, Not available, Other   Darvon [Propoxyphene Hcl] Other (See Comments)    "climb the walls"   Vicodin [Hydrocodone-Acetaminophen] Other (See Comments)    "climb the walls"    Outpatient Meds: Current Outpatient Medications  Medication Sig Dispense Refill   acetaminophen (TYLENOL) 325 MG tablet Take 2 tablets (650 mg total) by mouth every 6 (six) hours as needed for mild pain (or Fever >/= 101).     amLODipine (NORVASC) 5 MG tablet Take 5 mg by mouth daily.     levothyroxine (SYNTHROID) 88 MCG tablet Take 1 tablet (88 mcg total) by mouth daily. 45 tablet 5   SUMAtriptan (IMITREX) 100 MG tablet Take 1 as needed for migraineMay repeat in 2 hours if headache persists or recurs. 9 tablet 12   amoxicillin (AMOXIL) 500 MG capsule 2,000 mg. Prior to teeth cleaning (Patient not taking: Reported on 09/05/2023)     Ascorbic Acid (VITAMIN C) 1000 MG tablet Take 1,000 mg by mouth daily.     b complex vitamins capsule Take 1 capsule by mouth daily.     Berberine Chloride (BERBERINE HCI) 500 MG CAPS Take 1 capsule by mouth daily.     Cholecalciferol (VITAMIN D3) 10 MCG (400 UNIT) CAPS Take 1 tablet by mouth daily.     GARLIC PO Take 1 tablet by mouth daily.     Multiple Vitamin (MULTIVITAMIN WITH MINERALS) TABS tablet Take  1 tablet by mouth daily.     Omega-3 Fatty Acids (FISH OIL PO) Take 1 capsule by mouth daily.     OVER THE COUNTER MEDICATION Take 80-300 mg by mouth daily. Malic acid + Magnesium     OVER THE COUNTER MEDICATION Take 1 tablet by mouth daily. Proactazyme     No current facility-administered medications for this visit.      ___________________________________________________________________ Objective   Exam:  BP (!) 141/89   Pulse 81   Temp (!) 97.1 F (36.2 C)   Resp 11   Ht 5\' 2"  (1.575 m)   Wt 121 lb 9.6 oz (55.2 kg)   SpO2 100%    BMI 22.24 kg/m   CV: regular , S1/S2 Resp: clear to auscultation bilaterally, normal RR and effort noted GI: soft, no tenderness, with active bowel sounds.   Assessment: Encounter Diagnosis  Name Primary?   Special screening for malignant neoplasms, colon Yes     Plan: Colonoscopy  The benefits and risks of the planned procedure were described in detail with the patient or (when appropriate) their health care proxy.  Risks were outlined as including, but not limited to, bleeding, infection, perforation, adverse medication reaction leading to cardiac or pulmonary decompensation, pancreatitis (if ERCP).  The limitation of incomplete mucosal visualization was also discussed.  No guarantees or warranties were given.  The patient is appropriate for an endoscopic procedure in the ambulatory setting.   - Amada Jupiter, MD

## 2023-09-05 NOTE — Progress Notes (Signed)
Sedate, gd SR, tolerated procedure well, VSS, report to RN 

## 2023-09-05 NOTE — Progress Notes (Signed)
Pt's states no medical or surgical changes since previsit or office visit. 

## 2023-09-05 NOTE — Patient Instructions (Signed)
Resume previous diet and medications. No aspirin, ibuprofen, naproxen, or other non-steroidal anti-inflammatory drugs for 1 week after polyp removal. Awaiting pathology results. Repeat Colonoscopy is recommended for surveillance.  YOU HAD AN ENDOSCOPIC PROCEDURE TODAY AT THE [facility]:   Refer to the procedure report that was given to you for any specific questions about what was found during the examination.  If the procedure report does not answer your questions, please call your gastroenterologist to clarify.  If you requested that your care partner not be given the details of your procedure findings, then the procedure report has been included in a sealed envelope for you to review at your convenience later.  YOU SHOULD EXPECT: Some feelings of bloating in the abdomen. Passage of more gas than usual.  Walking can help get rid of the air that was put into your GI tract during the procedure and reduce the bloating. If you had a lower endoscopy (such as a colonoscopy or flexible sigmoidoscopy) you may notice spotting of blood in your stool or on the toilet paper. If you underwent a bowel prep for your procedure, you may not have a normal bowel movement for a few days.  Please Note:  You might notice some irritation and congestion in your nose or some drainage.  This is from the oxygen used during your procedure.  There is no need for concern and it should clear up in a day or so.  SYMPTOMS TO REPORT IMMEDIATELY:  Following lower endoscopy (colonoscopy or flexible sigmoidoscopy):  Excessive amounts of blood in the stool  Significant tenderness or worsening of abdominal pains  Swelling of the abdomen that is new, acute  Fever of 100F or higher  For urgent or emergent issues, a gastroenterologist can be reached at any hour by calling [phone]. Do not use MyChart messaging for urgent concerns.    DIET:  We do recommend a small meal at first, but then you may proceed to your  regular diet.  Drink plenty of fluids but you should avoid alcoholic beverages for 24 hours.  ACTIVITY:  You should plan to take it easy for the rest of today and you should NOT DRIVE or use heavy machinery until tomorrow (because of the sedation medicines used during the test).    FOLLOW UP: Our staff will call the number listed on your records the next business day following your procedure.  We will call around 7:15- 8:00 am to check on you and address any questions or concerns that you may have regarding the information given to you following your procedure. If we do not reach you, we will leave a message.     If any biopsies were taken you will be contacted by phone or by letter within the next 1-3 weeks.  Please call us at [phone] if you have not heard about the biopsies in 3 weeks.    SIGNATURES/CONFIDENTIALITY: You and/or your care partner have signed paperwork which will be entered into your electronic medical record.  These signatures attest to the fact that that the information above on your After Visit Summary has been reviewed and is understood.  Full responsibility of the confidentiality of this discharge information lies with you and/or your care-partner.

## 2023-09-06 ENCOUNTER — Telehealth: Payer: Self-pay | Admitting: *Deleted

## 2023-09-06 ENCOUNTER — Other Ambulatory Visit: Payer: Self-pay | Admitting: Radiology

## 2023-09-06 NOTE — Telephone Encounter (Signed)
  Follow up Call-     09/05/2023    7:42 AM  Call back number  Post procedure Call Back phone  # [phone]  Permission to leave phone message Yes     Patient questions:  Do you have a fever, pain , or abdominal swelling? No. Pain Score  0 *  Have you tolerated food without any problems? Yes.    Have you been able to return to your normal activities? Yes.    Do you have any questions about your discharge instructions: Diet   No. Medications  No. Follow up visit  No.  Do you have questions or concerns about your Care? No.  Actions: * If pain score is 4 or above: No action needed, pain <4.

## 2023-09-07 LAB — SURGICAL PATHOLOGY

## 2023-09-08 ENCOUNTER — Encounter: Payer: Self-pay | Admitting: Gastroenterology

## 2023-09-08 LAB — SURGICAL PATHOLOGY

## 2023-09-20 ENCOUNTER — Other Ambulatory Visit: Payer: Self-pay | Admitting: Internal Medicine

## 2023-12-10 ENCOUNTER — Other Ambulatory Visit: Payer: Self-pay | Admitting: Internal Medicine

## 2024-01-02 ENCOUNTER — Encounter: Payer: Self-pay | Admitting: Family Medicine

## 2024-01-02 DIAGNOSIS — C4490 Unspecified malignant neoplasm of skin, unspecified: Secondary | ICD-10-CM | POA: Insufficient documentation

## 2024-01-09 ENCOUNTER — Encounter: Payer: Self-pay | Admitting: Internal Medicine

## 2024-01-09 ENCOUNTER — Ambulatory Visit: Payer: BC Managed Care – PPO | Admitting: Internal Medicine

## 2024-01-09 VITALS — BP 126/80 | HR 70 | Ht 62.0 in | Wt 113.4 lb

## 2024-01-09 DIAGNOSIS — E890 Postprocedural hypothyroidism: Secondary | ICD-10-CM

## 2024-01-09 DIAGNOSIS — E0500 Thyrotoxicosis with diffuse goiter without thyrotoxic crisis or storm: Secondary | ICD-10-CM

## 2024-01-09 DIAGNOSIS — C73 Malignant neoplasm of thyroid gland: Secondary | ICD-10-CM | POA: Diagnosis not present

## 2024-01-09 DIAGNOSIS — E559 Vitamin D deficiency, unspecified: Secondary | ICD-10-CM | POA: Diagnosis not present

## 2024-01-09 NOTE — Patient Instructions (Signed)
Please continue Levothyroxine 88 mcg daily.  Take the thyroid hormone every day, with water, at least 30 minutes before breakfast, separated by at least 4 hours from: - acid reflux medications - calcium - iron - multivitamins  Continue vitamin D 2000 units daily.  Please stop at the lab.  Please return in 1 year.

## 2024-01-09 NOTE — Progress Notes (Signed)
Patient ID: Carol King, female   DOB: 1951-07-04, 73 y.o.   MRN: [id number]  HPI  Carol King is a 73 y.o.-year-old female, returning for follow-up for Graves' disease and papillary thyroid cancer.  She was previously seen by Dr. Lucianne Muss once, in 09/2014.  Last visit 1 year ago.    Interim history: No cold intolerance, constipation.  Also, no tremors, palpitations, heat intolerance, anxiety. She was released from Dr. Ardine Eng office 09/2023 with follow-up only with endocrinology.  Reviewed  history: She has a long history of suppressed TSH, per my records, this was first documented in 2014.  Since then, she saw Dr. Lucianne Muss in 2015 and he checked a thyroid uptake and scan and a thyroid ultrasound.  She did not return to see him afterwards.  TFTs in 2017 and 2018 were worse.  She started to see me in 2019, and we have been adjusting her methimazole dose, but she was lost for follow-up with me, also, after her visit in 08/2018.  She returned to see me in 2022 after a new referral was made due to persistently abnormal TFTs.  She was subsequently diagnosed with thyroid cancer and had thyroidectomy and RAI treatment (see below).  She is now on LT4 88 mcg daily (dose increased 12/2022): - in am (6 am) - fasting - at least 30 min from b'fast - no calcium   - no iron - + multivitamins in the pm - no PPIs - on Biotin in B complex (100 mcg) - last dose 2 days ago  I reviewed patient's TFTs: Lab Results  Component Value Date   TSH 1.71 02/23/2023   TSH 2.64 01/06/2023   TSH 1.00 07/16/2022   TSH 0.77 05/06/2022   TSH 0.17 (L) 03/16/2022   TSH 0.04 (L) 02/04/2022   TSH 0.11 (L) 12/30/2021   TSH 0.00 (L) 10/27/2021   TSH <0.01 (L) 08/15/2018   TSH 0.02 (L) 06/20/2018   FREET4 0.97 02/23/2023   FREET4 0.93 01/06/2023   FREET4 1.04 07/16/2022   FREET4 0.93 05/06/2022   FREET4 1.28 03/16/2022   FREET4 1.27 02/04/2022   FREET4 0.99 12/30/2021   FREET4 1.19 10/27/2021   FREET4 1.28  08/15/2018   FREET4 0.55 (L) 06/20/2018   T3FREE 3.5 12/30/2021   T3FREE 3.0 10/27/2021   T3FREE 5.2 (H) 08/15/2018   T3FREE 2.6 06/20/2018   T3FREE 3.1 05/10/2018   T3FREE 4.9 (H) 03/27/2018   T3FREE 4.0 06/08/2016   T3FREE 2.6 09/18/2014  09/24/2021: TSH <0.005, free T4 2.02,  TT3  122 (71-180) 09/30/2020: TSH <0.005, free T4 2.47 (0.82-1.77), WBC normal, 6.7 07/06/2019: TSH <0.006,  normal total T3 and total T4  Her Graves' antibodies were elevated: Lab Results  Component Value Date   TSI 427 (H) 03/27/2018    Thyroid uptake and scan (10/08/2014): Uptake 9.9%, consistent with subacute thyroiditis.  Possible cold nodule in the lower right lobe.  Thyroid ultrasound (10/31/2014): 1.8 cm right thyroid nodule, corresponding to the cold nodule on the thyroid scan. She refused a thyroid biopsy at that time.  Thyroid U/S (09/30/2021): Parenchymal Echotexture: Mildly heterogeneous Isthmus: 0.2 cm Right lobe: 5.5 x 2.2 x 2.1 cm Left lobe: 4.1 x 1.5 x 1.6 cm _________________________________________________________   Nodule 1: 3.6 x 3.1 x 2.2 cm cystic nodule in the inferior right thyroid lobe does not meet criteria for FNA or imaging follow-up. It has increased in size since prior examination, however predominately due to accumulation of fluid.   _________________________________________________________   Nodule 2:  1.0 x 0.7 x 0.7 cm spongiform nodule in the superior left thyroid lobe does not meet criteria for FNA or imaging surveillance. _________________________________________________________   Nodule # 3: Location: Left; mid Maximum size: 1.2 cm; Other 2 dimensions: 0.8 x 0.7 cm Composition: solid/almost completely solid (2) Echogenicity: hypoechoic (2) Echogenic foci: punctate echogenic foci (3)   **Given size (>/= 1.0 cm) and appearance, fine needle aspiration of this highly suspicious nodule should be considered based on TI-RADS criteria.     _________________________________________________________   IMPRESSION: Nodule 3 (TI-RADS 5) located in the mid left thyroid lobe meets criteria for FNA.  FNA of the left mid thyroid nodule (10/27/2021): THYROID, LEFT MID, FINE NEEDLE ASPIRATION:   FINAL MICROSCOPIC DIAGNOSIS:  - Findings consistent with papillary carcinoma (Bethesda category VI)  - See comment   SPECIMEN ADEQUACY:  Satisfactory for evaluation   Total thyroidectomy (01/04/2022) by Dr. Gerrit Friends:  A. THYROID, TOTAL THYROIDECTOMY:  - Multifocal papillary thyroid carcinoma, 0.6 and 0.5 cm.  - Microscopic focus of extrathyroidal extension.  - Margins negative for carcinoma.  - Adenomatous nodules, partially cystic.  - See oncology table.   B. LYMPH NODES, CENTRAL COMPARTMENT, RESECTION:  - Six lymph nodes negative for metastatic carcinoma (0/6).   ONCOLOGY TABLE:  THYROID GLAND, CARCINOMA: Resection  Procedure: Total thyroidectomy and central compartment lymph node  excision.  Tumor Focality: Multifocal, 2 tumors.  Tumor Site: Left inferior and left mid lobe.  Tumor Size: 0.6 cm (left inferior lobe) and 0.5 cm (left mid lobe).  Histologic Type: Papillary thyroid carcinoma, classic type.  Angioinvasion: Not identified.  Lymphatic Invasion: Not identified.  Extrathyroidal Extension: Present, microscopic (left mid lobe tumor).  Margin Status: All surgical margins negative for carcinoma.  Regional Lymph Node Status: Yes      Number of Lymph Nodes with Tumor: 0       Nodal Level(s) Involved: 0       Number of Lymph Nodes Examined: 6       Nodal Level(s) Examined: 1  Pathologic Stage Classification (pTNM, AJCC 8th Edition): pT1a (multifocal), pN0  Representative Tumor Block: A5 (left inferior lobe) and A4 (left middle lobe).  Comment(s): There is a 0.6 cm papillary carcinoma in the left inferior  lobe and a 0.5 cm papillary carcinoma in the left mid lobe.  There is a  microscopic focus of extrathyroidal extension  adjacent to the middle  lobe tumor.  The margins are negative for carcinoma.  There are  adenomatous nodules in the right and left lobes and the right lobe  adenomatous nodule is partially cystic.   RAI treatment (03/26/2022): 104.1 mCi  04/01/2022: Posttreatment WBC:  1. Focal activity in thyroid bed is favored remnant thyroid tissue. 2. No evidence of metastatic disease  03/15/2023: Neck U/S: No residual thyroid tissue is identified in the thyroidectomy bed. No enlarged lymph nodes seen in the imaged portions of the neck. IMPRESSION: No evidence of recurrent or residual thyroid malignancy.  Reviewed thyroglobulin levels:  Lab Results  Component Value Date   THYROGLB <0.1 (L) 01/06/2023   THYROGLB <0.1 (L) 07/16/2022   THGAB 1 01/06/2023   THGAB 1 07/16/2022   Pt denies: - dysphagia - choking - SOB with lying down - resolved hoarseness.  No FH of thyroid cancer. No h/o radiation tx to head or neck.  She does have family history of hypothyroidism. No herbal supplements. No Biotin use. No recent steroids use.   After surgery, calcium remains normal: Lab Results  Component Value Date  CALCIUM 9.7 02/04/2022   CALCIUM 10.3 01/05/2022   CALCIUM 10.1 12/24/2021   CALCIUM 9.5 03/09/2018   CALCIUM 9.0 03/04/2018   CALCIUM 8.5 (L) 03/03/2018   CALCIUM 9.2 03/01/2018   CALCIUM 9.7 06/10/2017   CALCIUM 9.4 06/08/2016   CALCIUM 8.9 05/07/2015  01/15/2022: Calcium 9.4  Lab Results  Component Value Date   VD25OH 34.30 01/06/2023   VD25OH 25.39 (L) 07/16/2022   VD25OH 22.25 (L) 02/04/2022   VD25OH 41 04/10/2012  We started 1000 units vitamin D daily in 01/2022 and increased the dose to 2000 units daily in 07/2022.  She also has a history of osteoporosis per DXA scan from 09/2017.  She had a stress fracture in 01/2018 which evolved to a complete fracture. After her fracture, she used a lot of NSAIDs and she developed stomach and duodenal ulcers and actually developed a  duodenal stricture.  On Fosamax.  ROS: + See HPI Resolved  tinnitus (now after hearing aids). + HAs (since she was a child)  I reviewed patient medications, allergies, past medical history, social history, family history and changes were documented in the history of present illness.  Past Medical History:  Diagnosis Date   Arthritis    Complication of anesthesia    Difficulty sleeping    DUE TO HIP PAIN   Headache    MIGRAINES   History of skin cancer    Hypertension    PONV (postoperative nausea and vomiting)    SEVERAL YRS AGO - NONE WITH MORE RECENT SURGERIES   Skin cancer    BCC, SCC   Past Surgical History:  Procedure Laterality Date   ABDOMINAL HYSTERECTOMY  1981   one ovary remains   APPENDECTOMY     ESOPHAGOGASTRODUODENOSCOPY (EGD) WITH PROPOFOL N/A 03/02/2018   Procedure: ESOPHAGOGASTRODUODENOSCOPY (EGD) WITH PROPOFOL;  Surgeon: Sherrilyn Rist, MD;  Location: [facility];  Service: Gastroenterology;  Laterality: N/A;   ESOPHAGOGASTRODUODENOSCOPY (EGD) WITH PROPOFOL N/A 03/21/2018   Procedure: ESOPHAGOGASTRODUODENOSCOPY (EGD) WITH PROPOFOL;  Surgeon: Sherrilyn Rist, MD;  Location: [facility];  Service: Gastroenterology;  Laterality: N/A;   LYMPH NODE DISSECTION N/A 01/04/2022   Procedure: LIMITED LYMPH NODE DISSECTION;  Surgeon: Darnell Level, MD;  Location: [facility];  Service: General;  Laterality: N/A;   SKIN CANCER EXCISION     THYROIDECTOMY N/A 01/04/2022   Procedure: TOTAL THYROIDECTOMY;  Surgeon: Darnell Level, MD;  Location: [facility];  Service: General;  Laterality: N/A;   TONSILLECTOMY AND ADENOIDECTOMY     TOTAL HIP ARTHROPLASTY Right 07/11/2011   HIP; Charlann Boxer   TOTAL HIP ARTHROPLASTY Left 05/06/2015   Procedure: LEFT TOTAL HIP ARTHROPLASTY ANTERIOR APPROACH;  Surgeon: Durene Romans, MD;  Location: [facility];  Service: Orthopedics;  Laterality: Left;   Social History   Socioeconomic History   Marital status: Single    Spouse name: n/a   Number of children: 0    Years of education: college   Highest education level: Not on file  Occupational History   Occupation: Agricultural engineer: [employer]   Occupation: Art gallery manager: [employer]  Tobacco Use   Smoking status: Former    Current packs/day: 0.00    Types: Cigarettes    Quit date: 04/30/1994    Years since quitting: 29.7   Smokeless tobacco: Former    Quit date: 03/09/1993  Vaping Use   Vaping status: Never Used  Substance and Sexual Activity   Alcohol use: No  Alcohol/week: 0.0 standard drinks of alcohol   Drug use: No   Sexual activity: Never  Other Topics Concern   Not on file  Social History Narrative   Home Environment: Lives at home alone with her two dogs. One level home with a few stairs to the front and back doors.   Diet: Stays clear of carbohydrates and chocolate because they trigger headaches. Likes meat/chicken and vegetables. Generally healthy.   Exercise: Stays busy and active at work. Takes the stairs instead of the elevator.   Sleep: Significantly improved since hip replacement surgeries. Goes to bed around 9 and wakes up at 6. Able to work full days.   Work: Still enjoys working at [employer].   Urinary problems: No concerns. Hydrates well.      Brother lives in [place], [place]   Social Drivers of Health   Financial Resource Strain: Low Risk  (10/10/2023)   Received from [facility]   Overall Financial Resource Strain (CARDIA)    Difficulty of Paying Living Expenses: Not hard at all  Food Insecurity: No Food Insecurity (10/10/2023)   Received from [facility]   Hunger Vital Sign    Worried About Running Out of Food in the Last Year: Never true    Ran Out of Food in the Last Year: Never true  Transportation Needs: No Transportation Needs (10/10/2023)   Received from [facility] - Transportation    Lack of Transportation (Medical): No    Lack of Transportation (Non-Medical): No  Physical  Activity: Inactive (10/04/2022)   Received from [facility], [facility]   Exercise Vital Sign    Days of Exercise per Week: 0 days    Minutes of Exercise per Session: 0 min  Stress: No Stress Concern Present (10/04/2022)   Received from [facility], [facility] of Occupational Health - Occupational Stress Questionnaire    Feeling of Stress : Not at all  Social Connections: Moderately Integrated (10/10/2023)   Received from [facility]   Social Connection and Isolation Panel [NHANES]    Frequency of Communication with Friends and Family: More than three times a week    Frequency of Social Gatherings with Friends and Family: More than three times a week    Attends Religious Services: More than 4 times per year    Active Member of [employer] or Organizations: Yes    Attends Engineer, structural: More than 4 times per year    Marital Status: Never married  Intimate Partner Violence: Not At Risk (10/10/2023)   Received from [facility], Afraid, Rape, and Kick questionnaire    Fear of Current or Ex-Partner: No    Emotionally Abused: No    Physically Abused: No    Sexually Abused: No   Current Outpatient Medications on File Prior to Visit  Medication Sig Dispense Refill   acetaminophen (TYLENOL) 325 MG tablet Take 2 tablets (650 mg total) by mouth every 6 (six) hours as needed for mild pain (or Fever >/= 101).     amLODipine (NORVASC) 5 MG tablet Take 5 mg by mouth daily.     amoxicillin (AMOXIL) 500 MG capsule 2,000 mg. Prior to teeth cleaning (Patient not taking: Reported on 09/05/2023)     Ascorbic Acid (VITAMIN C) 1000 MG tablet Take 1,000 mg by mouth daily.     b complex vitamins capsule Take 1 capsule by mouth daily.     Berberine Chloride (BERBERINE HCI) 500 MG CAPS  Take 1 capsule by mouth daily.     Cholecalciferol (VITAMIN D3) 10 MCG (400 UNIT) CAPS Take 1 tablet by mouth daily.     GARLIC PO Take 1 tablet by mouth daily.      levothyroxine (SYNTHROID) 88 MCG tablet TAKE 1 TABLET BY MOUTH EVERY DAY 30 tablet 2   Multiple Vitamin (MULTIVITAMIN WITH MINERALS) TABS tablet Take 1 tablet by mouth daily.     Omega-3 Fatty Acids (FISH OIL PO) Take 1 capsule by mouth daily.     OVER THE COUNTER MEDICATION Take 80-300 mg by mouth daily. Malic acid + Magnesium     OVER THE COUNTER MEDICATION Take 1 tablet by mouth daily. Proactazyme     SUMAtriptan (IMITREX) 100 MG tablet Take 1 as needed for migraineMay repeat in 2 hours if headache persists or recurs. 9 tablet 12   No current facility-administered medications on file prior to visit.   Allergies  Allergen Reactions   Propoxyphene     Other Reaction(s): Confusion, Not available, Other   Darvon [Propoxyphene Hcl] Other (See Comments)    "climb the walls"   Vicodin [Hydrocodone-Acetaminophen] Other (See Comments)    "climb the walls"   Family History  Problem Relation Age of Onset   Cancer Mother 61       breast cancer   Cancer Maternal Grandmother        breast cancer   Kidney disease Maternal Grandfather    Arthritis Paternal Grandmother    Colon cancer Neg Hx    Colon polyps Neg Hx    Rectal cancer Neg Hx    Stomach cancer Neg Hx    Esophageal cancer Neg Hx    PE: BP 126/80   Pulse 70   Ht 5\' 2"  (1.575 m)   Wt 113 lb 6.4 oz (51.4 kg)   SpO2 95%   BMI 20.74 kg/m  Wt Readings from Last 15 Encounters:  01/09/24 113 lb 6.4 oz (51.4 kg)  09/05/23 121 lb 9.6 oz (55.2 kg)  08/17/23 121 lb 9.6 oz (55.2 kg)  01/06/23 140 lb 12.8 oz (63.9 kg)  07/16/22 130 lb 6.4 oz (59.1 kg)  02/04/22 134 lb 3.2 oz (60.9 kg)  01/04/22 131 lb 6.4 oz (59.6 kg)  12/30/21 131 lb 6.4 oz (59.6 kg)  09/25/21 133 lb (60.3 kg)  08/15/18 121 lb (54.9 kg)  03/27/18 100 lb 6.4 oz (45.5 kg)  03/21/18 96 lb 6 oz (43.7 kg)  03/09/18 103 lb (46.7 kg)  03/04/18 103 lb 13.4 oz (47.1 kg)  06/10/17 108 lb 6.4 oz (49.2 kg)   Constitutional: normal weight, in NAD Eyes: EOMI, no  exophthalmos ENT: no neck masses palpated, thyroidectomy scar healing well, no cervical lymphadenopathy Cardiovascular: RRR, No MRG Respiratory: CTA B Musculoskeletal: no deformities Skin: No rashes Neurological: no tremor with outstretched hands  ASSESSMENT: 1.  History of Graves' disease  2.  Thyroid nodules  3.  Papillary thyroid cancer  4.  Postsurgical hypothyroidism  5.  Vitamin D insufficiency  PLAN:  1. And 2. Patient with a long history of low TSH, with some thyrotoxic symptoms.  She did not have weight loss, palpitations, anxiety but had heat intolerance and increased sweating in the past, which reappeared before she reestablished care with me. -We had her on methimazole, however, she ended up having total thyroidectomy in 12/2021 by Dr. Gerrit Friends  3 PTC -She has a history of a right thyroid nodule for which she refused further investigation in the past,  however, after she reestablished care with me in 09/2021, we checked another thyroid ultrasound and, since this nodule was suspicious, I again suggested biopsy.  She had this and the results were consistent with papillary thyroid cancer.  She had thyroidectomy in 12/2021 by Dr. 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  She had slight extrathyroidal extension of the tumor, which put her at the threshold between low and intermediate risk.  Multifocality does not necessarily put her at high risk for recurrence.  She had RAI treatment and a whole-body scan showed no abnormal uptake, only thyroid remnant uptake, as expected.  Thyroglobulin levels remained undetectable, including at last visit.  Thyroglobulin antibodies are also undetectable.  We checked a neck ultrasound 03/15/2023 and this showed no abnormal masses or any signs of recurrence. -At today's visit, she has no neck compression symptoms -Will recheck her thyroglobulin level along with antibodies today Tg -Plan to check another neck ultrasound if thyroglobulin is  trending up, she has masses on palpation of her neck, otherwise repeat this in 2 to 4 years. -I plan to see her back in 1 year  4.  Postsurgical hypothyroidism - latest thyroid labs reviewed with pt. >> normal: Lab Results  Component Value Date   TSH 1.71 02/23/2023  - she continues on LT4 88 mcg daily, dose increased at last visit - pt feels good on this dose.  At last visit she was unhappy that she gained at least 10 pounds but she did lose 27 pounds since last visit. - we discussed about taking the thyroid hormone every day, with water, >30 minutes before breakfast, separated by >4 hours from acid reflux medications, calcium, iron, multivitamins. Pt. is taking it correctly. - will check thyroid tests today: TSH and fT4 - If labs are abnormal, she will need to return for repeat TFTs in 1.5 months  5.  Vitamin D insufficiency -Latest vitamin D level was normal at last visit: Lab Results  Component Value Date   VD25OH 34.30 01/06/2023  -We have her on 2000 units daily.  She was forgetting doses at last visit and I advised her to set alarms on her phone or to use a pillbox to take this more consistently. -We will recheck the level today  Needs refills - 90 days.  Orders Placed This Encounter  Procedures   TSH   T4, free   Thyroglobulin antibody   Thyroglobulin Level   Vitamin D, 25-hydroxy   Carlus Pavlov, MD PhD [facility] Endocrinology

## 2024-01-11 LAB — VITAMIN D 25 HYDROXY (VIT D DEFICIENCY, FRACTURES): Vit D, 25-Hydroxy: 90 ng/mL (ref 30–100)

## 2024-01-11 LAB — TSH: TSH: 1.65 m[IU]/L (ref 0.40–4.50)

## 2024-01-11 LAB — THYROGLOBULIN LEVEL: Thyroglobulin: <0.1 ng/mL — ABNORMAL LOW

## 2024-01-11 LAB — T4, FREE: Free T4: 1.6 ng/dL (ref 0.8–1.8)

## 2024-01-11 LAB — THYROGLOBULIN ANTIBODY: Thyroglobulin Ab: 1 [IU]/mL (ref <=1)

## 2024-01-12 ENCOUNTER — Encounter: Payer: Self-pay | Admitting: Internal Medicine

## 2024-01-12 MED ORDER — LEVOTHYROXINE SODIUM 88 MCG PO TABS: 88.0000 ug | ORAL_TABLET | Freq: Every day | ORAL | 3 refills | Status: AC

## 2024-01-12 NOTE — Addendum Note (Signed)
Addended by: Carlus Pavlov on: 01/12/2024 10:20 AM   Modules accepted: Orders

## 2024-07-23 ENCOUNTER — Telehealth: Payer: Self-pay | Admitting: Gastroenterology

## 2024-07-23 NOTE — Telephone Encounter (Signed)
 Good Morning Dr. Legrand, I received a call from this patient stating that she was told to make a follow up colonoscopy appointment with you for this year. I did see that this patient has a recall date of September 2027. Would you please advise on scheduling.   Thank you.

## 2024-07-24 ENCOUNTER — Encounter: Payer: Self-pay | Admitting: Gastroenterology

## 2024-07-24 NOTE — Telephone Encounter (Signed)
 I had recommended a 3-year recall after the September 20 24th colonoscopy.  However upon further consideration of the findings on that last exam, particularly of the larger rectal polyp removed, she should have a colonoscopy with me by the end of the calendar year.  It can be directly booked in the [facility].  HD

## 2024-09-03 ENCOUNTER — Ambulatory Visit (AMBULATORY_SURGERY_CENTER)

## 2024-09-03 VITALS — Ht 62.0 in | Wt 108.6 lb

## 2024-09-03 DIAGNOSIS — Z860100 Personal history of colon polyps, unspecified: Secondary | ICD-10-CM

## 2024-09-03 MED ORDER — NA SULFATE-K SULFATE-MG SULF 17.5-3.13-1.6 GM/177ML PO SOLN: 1.0000 | Freq: Once | ORAL | 0 refills | Status: AC

## 2024-09-03 NOTE — Progress Notes (Signed)

## 2024-09-05 ENCOUNTER — Encounter: Payer: Self-pay | Admitting: Gastroenterology

## 2024-09-17 ENCOUNTER — Ambulatory Visit (AMBULATORY_SURGERY_CENTER): Admitting: Gastroenterology

## 2024-09-17 ENCOUNTER — Encounter: Payer: Self-pay | Admitting: Gastroenterology

## 2024-09-17 VITALS — BP 114/67 | HR 64 | Temp 97.9°F | Resp 14 | Ht 62.0 in | Wt 108.6 lb

## 2024-09-17 DIAGNOSIS — Q438 Other specified congenital malformations of intestine: Secondary | ICD-10-CM

## 2024-09-17 DIAGNOSIS — K6289 Other specified diseases of anus and rectum: Secondary | ICD-10-CM

## 2024-09-17 DIAGNOSIS — Z860101 Personal history of adenomatous and serrated colon polyps: Secondary | ICD-10-CM

## 2024-09-17 DIAGNOSIS — K648 Other hemorrhoids: Secondary | ICD-10-CM | POA: Diagnosis not present

## 2024-09-17 DIAGNOSIS — Z1211 Encounter for screening for malignant neoplasm of colon: Secondary | ICD-10-CM | POA: Diagnosis present

## 2024-09-17 DIAGNOSIS — Z860100 Personal history of colon polyps, unspecified: Secondary | ICD-10-CM

## 2024-09-17 MED ORDER — SODIUM CHLORIDE 0.9 % IV SOLN
500.0000 mL | INTRAVENOUS | Status: AC
Start: 2024-09-17 — End: 2024-09-18

## 2024-09-17 NOTE — Progress Notes (Signed)
 History and Physical:  This patient presents for endoscopic testing for: Encounter Diagnosis  Name Primary?   History of colonic polyps Yes    73 year old woman here today for surveillance colonoscopy.  At the time of her first screening colonoscopy in September 2024, several polyps were removed: A 12 mm tubular adenoma, 6 mm SSP without dysplasia, and an 18 to 20 mm semipedunculated rectal tubulovillous adenoma.   Patient is otherwise without complaints or active issues today.   Past Medical History: Past Medical History:  Diagnosis Date   Arthritis    Complication of anesthesia    Difficulty sleeping    DUE TO HIP PAIN   Headache    MIGRAINES   History of skin cancer    Hyperlipidemia    Hypertension    PONV (postoperative nausea and vomiting)    SEVERAL YRS AGO - NONE WITH MORE RECENT SURGERIES   Skin cancer    BCC, SCC     Past Surgical History: Past Surgical History:  Procedure Laterality Date   ABDOMINAL HYSTERECTOMY  12/14/1979   one ovary remains   APPENDECTOMY     COLONOSCOPY     ESOPHAGOGASTRODUODENOSCOPY (EGD) WITH PROPOFOL  N/A 03/02/2018   Procedure: ESOPHAGOGASTRODUODENOSCOPY (EGD) WITH PROPOFOL ;  Surgeon: Legrand Victory LITTIE DOUGLAS, MD;  Location: [facility];  Service: Gastroenterology;  Laterality: N/A;   ESOPHAGOGASTRODUODENOSCOPY (EGD) WITH PROPOFOL  N/A 03/21/2018   Procedure: ESOPHAGOGASTRODUODENOSCOPY (EGD) WITH PROPOFOL ;  Surgeon: Legrand Victory LITTIE DOUGLAS, MD;  Location: [facility];  Service: Gastroenterology;  Laterality: N/A;   LYMPH NODE DISSECTION N/A 01/04/2022   Procedure: LIMITED LYMPH NODE DISSECTION;  Surgeon: Eletha Boas, MD;  Location: [facility];  Service: General;  Laterality: N/A;   SKIN CANCER EXCISION     THYROIDECTOMY N/A 01/04/2022   Procedure: TOTAL THYROIDECTOMY;  Surgeon: Eletha Boas, MD;  Location: [facility];  Service: General;  Laterality: N/A;   TONSILLECTOMY AND ADENOIDECTOMY     TOTAL HIP ARTHROPLASTY Right 07/11/2011   HIP; Ernie    TOTAL HIP ARTHROPLASTY Left 05/06/2015   Procedure: LEFT TOTAL HIP ARTHROPLASTY ANTERIOR APPROACH;  Surgeon: Donnice Ernie, MD;  Location: [facility];  Service: Orthopedics;  Laterality: Left;    Allergies: Allergies  Allergen Reactions   Darvon [Propoxyphene Hcl] Other (See Comments)    climb the walls   Propoxyphene     Other Reaction(s): Confusion, Not available, Other   Vicodin [Hydrocodone-Acetaminophen ] Other (See Comments)    climb the walls    Outpatient Meds: Current Outpatient Medications  Medication Sig Dispense Refill   acetaminophen  (TYLENOL ) 325 MG tablet Take 2 tablets (650 mg total) by mouth every 6 (six) hours as needed for mild pain (or Fever >/= 101).     amLODipine  (NORVASC ) 5 MG tablet Take 5 mg by mouth daily.     Ascorbic Acid (VITAMIN C) 1000 MG tablet Take 1,000 mg by mouth daily.     b complex vitamins capsule Take 1 capsule by mouth daily.     levothyroxine  (SYNTHROID ) 88 MCG tablet Take 1 tablet (88 mcg total) by mouth daily. 90 tablet 3   SUMAtriptan  (IMITREX ) 100 MG tablet Take 1 as needed for migraineMay repeat in 2 hours if headache persists or recurs. 9 tablet 12   Testosterone 25 MG PLLT 25 mg See admin instructions.     Berberine Chloride (BERBERINE HCI) 500 MG CAPS Take 1 capsule by mouth daily. (Patient not taking: Reported on 09/03/2024)     Cholecalciferol (VITAMIN D3) 10 MCG (400 UNIT) CAPS Take  1 tablet by mouth daily. (Patient taking differently: Take 200 Units by mouth daily.)     GARLIC PO Take 1 tablet by mouth daily.     Multiple Vitamin (MULTIVITAMIN WITH MINERALS) TABS tablet Take 1 tablet by mouth daily.     OVER THE COUNTER MEDICATION Take 80-300 mg by mouth daily. Malic acid + Magnesium  (Patient not taking: Reported on 09/03/2024)     Current Facility-Administered Medications  Medication Dose Route Frequency Provider Last Rate Last Admin   0.9 %  sodium chloride  infusion  500 mL Intravenous Continuous Legrand Victory CROME III, MD           ___________________________________________________________________ Objective   Exam:  BP 127/78   Pulse 79   Temp 97.9 F (36.6 C) (Temporal)   Ht 5' 2 (1.575 m)   Wt 108 lb 9.6 oz (49.3 kg)   SpO2 98%   BMI 19.86 kg/m   CV: regular , S1/S2 Resp: clear to auscultation bilaterally, normal RR and effort noted GI: soft, no tenderness, with active bowel sounds.   Assessment: Encounter Diagnosis  Name Primary?   History of colonic polyps Yes     Plan: Colonoscopy   The benefits and risks of the planned procedure(s) were described in detail with the patient or (when appropriate) their health care proxy.  Risks were outlined as including, but not limited to, bleeding, infection, perforation, adverse medication reaction leading to cardiac or pulmonary decompensation, pancreatitis (if ERCP).  The limitation of incomplete mucosal visualization was also discussed.  No guarantees or warranties were given.  The patient is appropriate for an endoscopic procedure in the ambulatory setting.   - Victory Legrand, MD

## 2024-09-17 NOTE — Progress Notes (Signed)
 Pt's states no medical or surgical changes since previsit or office visit.

## 2024-09-17 NOTE — Op Note (Signed)
 [facility] Patient Name: Carol King Procedure Date: 09/17/2024 8:20 AM MRN: [id number] Endoscopist: Victory L. Legrand , MD, [id number] Age: 73 Referring MD:  Date of Birth: May 12, 1951 Gender: Female Account #: [id number] Procedure:                Colonoscopy Indications:              Personal history of colonic polyps                           For screening: Anoscopy September 2024: 12 mm TA, 6                            mm SSP, 18 to 20 mm semipedunculated rectal TVA                            removed piecemeal Medicines:                Monitored Anesthesia Care Procedure:                Pre-Anesthesia Assessment:                           - Prior to the procedure, a History and Physical                            was performed, and patient medications and                            allergies were reviewed. The patient's tolerance of                            previous anesthesia was also reviewed. The risks                            and benefits of the procedure and the sedation                            options and risks were discussed with the patient.                            All questions were answered, and informed consent                            was obtained. Prior Anticoagulants: The patient has                            taken no anticoagulant or antiplatelet agents. ASA                            Grade Assessment: II - A patient with mild systemic                            disease. After reviewing the risks and benefits,  the patient was deemed in satisfactory condition to                            undergo the procedure.                           After obtaining informed consent, the colonoscope                            was passed under direct vision. Throughout the                            procedure, the patient's blood pressure, pulse, and                            oxygen saturations were monitored continuously. The                             Olympus Scope SN: [id number] was introduced through                            the anus and advanced to the the cecum, identified                            by appendiceal orifice and ileocecal valve. The                            colonoscopy was somewhat difficult due to a                            redundant colon. Successful completion of the                            procedure was aided by using manual pressure and                            straightening and shortening the scope to obtain                            bowel loop reduction. The patient tolerated the                            procedure well. The quality of the bowel                            preparation was excellent. The ileocecal valve,                            appendiceal orifice, and rectum were photographed. Scope In: 8:35:26 AM Scope Out: 8:52:05 AM Scope Withdrawal Time: 0 hours 11 minutes 57 seconds  Total Procedure Duration: 0 hours 16 minutes 39 seconds  Findings:                 The digital rectal exam findings include decreased  sphincter tone.                           Repeat examination of right colon under NBI                            performed.                           Internal hemorrhoids were found. The hemorrhoids                            were small.                           The exam was otherwise without abnormality on                            direct and retroflexion views. Complications:            No immediate complications. Estimated Blood Loss:     Estimated blood loss: none. Impression:               - Decreased sphincter tone found on digital rectal                            exam.                           - Internal hemorrhoids.                           - The examination was otherwise normal on direct                            and retroflexion views.                           - No specimens collected. Recommendation:           -  Patient has a contact number available for                            emergencies. The signs and symptoms of potential                            delayed complications were discussed with the                            patient. Return to normal activities tomorrow.                            Written discharge instructions were provided to the                            patient.                           - Resume previous diet.                           -  Continue present medications.                           - Repeat colonoscopy in 3 years for surveillance.                            (Recall patient at that time to assess health and                            willingness to undergo colonoscopy) Victory L. Legrand, MD 09/17/2024 8:56:21 AM This report has been signed electronically.

## 2024-09-17 NOTE — Patient Instructions (Signed)
 YOU HAD AN ENDOSCOPIC PROCEDURE TODAY AT THE [facility]:   Refer to the procedure report that was given to you for any specific questions about what was found during the examination.  If the procedure report does not answer your questions, please call your gastroenterologist to clarify.  If you requested that your care partner not be given the details of your procedure findings, then the procedure report has been included in a sealed envelope for you to review at your convenience later.  YOU SHOULD EXPECT: Some feelings of bloating in the abdomen. Passage of more gas than usual.  Walking can help get rid of the air that was put into your GI tract during the procedure and reduce the bloating. If you had a lower endoscopy (such as a colonoscopy or flexible sigmoidoscopy) you may notice spotting of blood in your stool or on the toilet paper. If you underwent a bowel prep for your procedure, you may not have a normal bowel movement for a few days.  Please Note:  You might notice some irritation and congestion in your nose or some drainage.  This is from the oxygen used during your procedure.  There is no need for concern and it should clear up in a day or so.  SYMPTOMS TO REPORT IMMEDIATELY:  Following lower endoscopy (colonoscopy or flexible sigmoidoscopy):  Excessive amounts of blood in the stool  Significant tenderness or worsening of abdominal pains  Swelling of the abdomen that is new, acute  Fever of 100F or higher   For urgent or emergent issues, a gastroenterologist can be reached at any hour by calling [phone]. Do not use MyChart messaging for urgent concerns.    DIET:  We do recommend a small meal at first, but then you may proceed to your regular diet.  Drink plenty of fluids but you should avoid alcoholic beverages for 24 hours.  MEDICATIONS: Continue present medications.  FOLLOW UP: Repeat colonoscopy in 3 years for surveillance. (Recall patient at that time to  assess health and willingness to undergo colonoscopy).  Educational handouts given to patient: Hemorrhoids.  Thank you for allowing us  to provide for your healthcare needs today.  ACTIVITY:  You should plan to take it easy for the rest of today and you should NOT DRIVE or use heavy machinery until tomorrow (because of the sedation medicines used during the test).    FOLLOW UP: Our staff will call the number listed on your records the next business day following your procedure.  We will call around 7:15- 8:00 am to check on you and address any questions or concerns that you may have regarding the information given to you following your procedure. If we do not reach you, we will leave a message.     If any biopsies were taken you will be contacted by phone or by letter within the next 1-3 weeks.  Please call us  at [phone] if you have not heard about the biopsies in 3 weeks.    SIGNATURES/CONFIDENTIALITY: You and/or your care partner have signed paperwork which will be entered into your electronic medical record.  These signatures attest to the fact that that the information above on your After Visit Summary has been reviewed and is understood.  Full responsibility of the confidentiality of this discharge information lies with you and/or your care-partner.

## 2024-09-17 NOTE — Progress Notes (Signed)
 Sedate, gd SR, tolerated procedure well, VSS, report to RN

## 2024-09-18 ENCOUNTER — Telehealth: Payer: Self-pay | Admitting: *Deleted

## 2024-09-18 NOTE — Telephone Encounter (Signed)
  Follow up Call-     09/17/2024    7:40 AM 09/05/2023    7:42 AM  Call back number  Post procedure Call Back phone  # [phone]  Permission to leave phone message Yes Yes     Patient questions:  Do you have a fever, pain , or abdominal swelling? No. Pain Score  0 *  Have you tolerated food without any problems? Yes.    Have you been able to return to your normal activities? Yes.    Do you have any questions about your discharge instructions: Diet   No. Medications  No. Follow up visit  No.  Do you have questions or concerns about your Care? No.  Actions: * If pain score is 4 or above: No action needed, pain <4.

## 2024-11-21 ENCOUNTER — Telehealth: Payer: Self-pay | Admitting: *Deleted

## 2024-11-21 ENCOUNTER — Ambulatory Visit: Admitting: Family Medicine

## 2024-11-21 VITALS — BP 164/88 | Ht 61.5 in | Wt 109.0 lb

## 2024-11-21 DIAGNOSIS — M810 Age-related osteoporosis without current pathological fracture: Secondary | ICD-10-CM

## 2024-11-21 NOTE — Patient Instructions (Addendum)
 You have osteoporosis with a T-score of -3.6 at your forearm.  I have all the labwork I need and they look good. Continue with your calcium  and vitamin D  supplementation (look up how much you're taking when you get home but your levels are right in the normal range). We will look into evenity for you and call you with cost/coverage information and when you could come in to get this.

## 2024-11-21 NOTE — Progress Notes (Signed)
 PCP: Juliane Che, PA  Patient is a 73 y.o. female here for osteoporosis.  HPI Patient had recent bone density test last month showing severe osteoporosis of forearm with T score less than -3. She has been on fosamax  previously but her bone density improved so she discontinued. At one time had an esophageal stricture as well and advised not to take fosamax  then.  This was treated successfully. She had bilateral hip replacements. Prior treatment: fosamax  History of Hip, Spine, or Wrist Fracture: no Heart disease or stroke: no Cancer: yes - skin and thyroid  Kidney Disease: no Gastric/Peptic Ulcer: no Gastric bypass surgery: no Severe GERD: no History of seizures: no Age at Menopause: ~29 Calcium  intake: 1000mg  Vitamin D  intake: uncertain though takes with her calcium  Hormone replacement therapy: has testosterone pellet Smoking history: former Alcohol: 0 Exercise: walks daily - 10K-12K steps daily Major dental work in past year: no Parents with hip/spine fracture: yes  Past Medical History:  Diagnosis Date   Arthritis    Complication of anesthesia    Difficulty sleeping    DUE TO HIP PAIN   Headache    MIGRAINES   History of skin cancer    Hyperlipidemia    Hypertension    PONV (postoperative nausea and vomiting)    SEVERAL YRS AGO - NONE WITH MORE RECENT SURGERIES   Skin cancer    BCC, SCC    Current Outpatient Medications on File Prior to Visit  Medication Sig Dispense Refill   acetaminophen  (TYLENOL ) 325 MG tablet Take 2 tablets (650 mg total) by mouth every 6 (six) hours as needed for mild pain (or Fever >/= 101).     amLODipine  (NORVASC ) 5 MG tablet Take 5 mg by mouth daily.     Ascorbic Acid (VITAMIN C) 1000 MG tablet Take 1,000 mg by mouth daily.     b complex vitamins capsule Take 1 capsule by mouth daily.     Berberine Chloride (BERBERINE HCI) 500 MG CAPS Take 1 capsule by mouth daily. (Patient not taking: Reported on 09/03/2024)     Cholecalciferol  (VITAMIN D3) 10 MCG (400 UNIT) CAPS Take 1 tablet by mouth daily. (Patient taking differently: Take 200 Units by mouth daily.)     GARLIC PO Take 1 tablet by mouth daily.     levothyroxine  (SYNTHROID ) 88 MCG tablet Take 1 tablet (88 mcg total) by mouth daily. 90 tablet 3   Multiple Vitamin (MULTIVITAMIN WITH MINERALS) TABS tablet Take 1 tablet by mouth daily.     OVER THE COUNTER MEDICATION Take 80-300 mg by mouth daily. Malic acid + Magnesium  (Patient not taking: Reported on 09/03/2024)     SUMAtriptan  (IMITREX ) 100 MG tablet Take 1 as needed for migraineMay repeat in 2 hours if headache persists or recurs. 9 tablet 12   Testosterone 25 MG PLLT 25 mg See admin instructions.     No current facility-administered medications on file prior to visit.    Past Surgical History:  Procedure Laterality Date   ABDOMINAL HYSTERECTOMY  12/14/1979   one ovary remains   APPENDECTOMY     COLONOSCOPY     ESOPHAGOGASTRODUODENOSCOPY (EGD) WITH PROPOFOL  N/A 03/02/2018   Procedure: ESOPHAGOGASTRODUODENOSCOPY (EGD) WITH PROPOFOL ;  Surgeon: Legrand Victory LITTIE DOUGLAS, MD;  Location: [facility];  Service: Gastroenterology;  Laterality: N/A;   ESOPHAGOGASTRODUODENOSCOPY (EGD) WITH PROPOFOL  N/A 03/21/2018   Procedure: ESOPHAGOGASTRODUODENOSCOPY (EGD) WITH PROPOFOL ;  Surgeon: Legrand Victory LITTIE DOUGLAS, MD;  Location: [facility];  Service: Gastroenterology;  Laterality: N/A;   LYMPH  NODE DISSECTION N/A 01/04/2022   Procedure: LIMITED LYMPH NODE DISSECTION;  Surgeon: Eletha Boas, MD;  Location: [facility];  Service: General;  Laterality: N/A;   SKIN CANCER EXCISION     THYROIDECTOMY N/A 01/04/2022   Procedure: TOTAL THYROIDECTOMY;  Surgeon: Eletha Boas, MD;  Location: [facility];  Service: General;  Laterality: N/A;   TONSILLECTOMY AND ADENOIDECTOMY     TOTAL HIP ARTHROPLASTY Right 07/11/2011   HIP; Ernie   TOTAL HIP ARTHROPLASTY Left 05/06/2015   Procedure: LEFT TOTAL HIP ARTHROPLASTY ANTERIOR APPROACH;  Surgeon: Donnice Ernie, MD;   Location: [facility];  Service: Orthopedics;  Laterality: Left;    Allergies  Allergen Reactions   Darvon [Propoxyphene Hcl] Other (See Comments)    climb the walls   Propoxyphene     Other Reaction(s): Confusion, Not available, Other   Vicodin [Hydrocodone-Acetaminophen ] Other (See Comments)    climb the walls    BP (!) 164/88   Ht 5' 1.5 (1.562 m)   Wt 109 lb (49.4 kg)   BMI 20.26 kg/m       No data to display              No data to display              Objective:  Physical Exam:  Gen: NAD, comfortable in exam room  Labs 10/29: CMP normal including Ca 9.1, Cr 0.74, Alk phos 61 25-OH Vit D 47.5 CBC normal (slightly elevated absolute lymphocytes) Dexa 11/19 T scores: Spine 0.1, Forearm -3.6  Assessment and Plan:  Severe osteoporosis - with worst T score of -3.6 of the forearm.  Would benefit from anabolic medication given severity - discussed risks/benefits of each and will look into evenity.  She has history of thyroid  cancer so tymlos/forteo not preferred.  Also history of esophageal stricture so would avoid oral bisphosphonates in future for maintenance medication.  Encouraged calcium , vitamin D  supplementation.    Total visit time 30 minutes including documentation.

## 2024-11-21 NOTE — Telephone Encounter (Addendum)
 Patient is ready for scheduling on or after  BUY AND BILL  Out-of-pocket cost due at time of visit:   Primary: [employer]  COVERAGE DETAILS: Please note that there is no coverage for Evenity through the [employer] pathway. Specialty Pharmacy is mandatory.    ** This summary of benefits is an estimation of the patient's out-of-pocket cost. Exact cost may vary based on individual plan coverage.

## 2024-11-22 ENCOUNTER — Other Ambulatory Visit: Payer: Self-pay

## 2024-11-22 ENCOUNTER — Other Ambulatory Visit: Payer: Self-pay | Admitting: *Deleted

## 2024-11-22 DIAGNOSIS — M810 Age-related osteoporosis without current pathological fracture: Secondary | ICD-10-CM

## 2024-11-22 MED ORDER — EVENITY 105 MG/1.17ML ~~LOC~~ SOSY
210.0000 mg | PREFILLED_SYRINGE | Freq: Once | SUBCUTANEOUS | 0 refills | Status: AC
  Filled 2024-11-23: qty 3.51, 1d supply, fill #0

## 2024-11-23 ENCOUNTER — Telehealth: Payer: Self-pay

## 2024-11-23 ENCOUNTER — Other Ambulatory Visit: Payer: Self-pay

## 2024-11-23 NOTE — Telephone Encounter (Signed)
 Pharmacy Patient Advocate Encounter   Received notification from Physician's Office that prior authorization for Evenity is required/requested.   Insurance verification completed.   The patient is insured through [employer].   Per test claim: Per test claim, medication is not covered due to plan/benefit exclusion, PA not submitted at this time

## 2024-11-26 ENCOUNTER — Telehealth: Payer: Self-pay

## 2024-11-26 ENCOUNTER — Other Ambulatory Visit: Payer: Self-pay

## 2024-11-26 NOTE — Telephone Encounter (Signed)
 Pharmacy Patient Advocate Encounter   Received notification from Physician's Office that prior authorization for Tymlos is required/requested.   Insurance verification completed.   The patient is insured through [employer].   Per test claim: PA required; PA submitted to above mentioned insurance via Latent Key/confirmation #/EOC [id number] Status is pending

## 2024-11-26 NOTE — Telephone Encounter (Signed)
 Pharmacy Patient Advocate Encounter  Received notification from [employer] that Prior Authorization for Carol King has been APPROVED from 10/27/24 to 11/26/26   PA #/Case ID/Reference #: [id number]  Patient must fill with [facility]

## 2024-11-29 ENCOUNTER — Encounter: Payer: Self-pay | Admitting: Family Medicine

## 2024-11-30 ENCOUNTER — Other Ambulatory Visit: Payer: Self-pay | Admitting: *Deleted

## 2024-11-30 ENCOUNTER — Other Ambulatory Visit: Payer: Self-pay

## 2024-11-30 MED ORDER — TYMLOS 3120 MCG/1.56ML ~~LOC~~ SOPN
80.0000 ug | PEN_INJECTOR | Freq: Every day | SUBCUTANEOUS | 12 refills | Status: DC
  Filled 2024-11-30: qty 1.56, fill #0

## 2024-11-30 MED ORDER — TYMLOS 3120 MCG/1.56ML ~~LOC~~ SOPN: 80.0000 ug | PEN_INJECTOR | Freq: Every day | SUBCUTANEOUS | 12 refills | Status: AC

## 2024-11-30 NOTE — Telephone Encounter (Signed)
 Called and spoke with patient about options.  Evenity  not covered by insurance.  Typically avoid tymlos with history of cancer but she had her thyroid  completely removed so risk lower.  Discussed prolia as well and she would like to proceed with tymlos for anabolic benefits.  Advised to follow up in 1 year with us  but message me in a month at the latest with how she's doing.  In 1 year would repeat bone density for response to treatment along with CMP, 25-OH VItamin D  levels.

## 2025-01-07 ENCOUNTER — Ambulatory Visit: Payer: BC Managed Care – PPO | Admitting: Internal Medicine

## 2025-01-21 ENCOUNTER — Ambulatory Visit: Admitting: Internal Medicine
